# Patient Record
Sex: Male | Born: 1960 | Race: White | Hispanic: No | Smoking: Former smoker
Health system: Southern US, Community
[De-identification: ages and names within clinical notes are randomized; demographics above are authoritative.]

## PROBLEM LIST (undated history)

## (undated) DIAGNOSIS — Z9289 Personal history of other medical treatment: Secondary | ICD-10-CM

## (undated) DIAGNOSIS — N400 Enlarged prostate without lower urinary tract symptoms: Secondary | ICD-10-CM

## (undated) DIAGNOSIS — G40909 Epilepsy, unspecified, not intractable, without status epilepticus: Secondary | ICD-10-CM

## (undated) DIAGNOSIS — H01009 Unspecified blepharitis unspecified eye, unspecified eyelid: Secondary | ICD-10-CM

## (undated) DIAGNOSIS — Z8249 Family history of ischemic heart disease and other diseases of the circulatory system: Secondary | ICD-10-CM

## (undated) DIAGNOSIS — I2510 Atherosclerotic heart disease of native coronary artery without angina pectoris: Secondary | ICD-10-CM

## (undated) DIAGNOSIS — K219 Gastro-esophageal reflux disease without esophagitis: Secondary | ICD-10-CM

## (undated) DIAGNOSIS — Z85828 Personal history of other malignant neoplasm of skin: Secondary | ICD-10-CM

## (undated) DIAGNOSIS — E039 Hypothyroidism, unspecified: Secondary | ICD-10-CM

## (undated) DIAGNOSIS — E785 Hyperlipidemia, unspecified: Secondary | ICD-10-CM

## (undated) DIAGNOSIS — I10 Essential (primary) hypertension: Secondary | ICD-10-CM

## (undated) DIAGNOSIS — G43909 Migraine, unspecified, not intractable, without status migrainosus: Secondary | ICD-10-CM

## (undated) HISTORY — DX: Hypothyroidism, unspecified: E03.9

## (undated) HISTORY — DX: Unspecified blepharitis unspecified eye, unspecified eyelid: H01.009

## (undated) HISTORY — DX: Benign prostatic hyperplasia without lower urinary tract symptoms: N40.0

## (undated) HISTORY — DX: Personal history of other medical treatment: Z92.89

## (undated) HISTORY — DX: Atherosclerotic heart disease of native coronary artery without angina pectoris: I25.10

## (undated) HISTORY — DX: Migraine, unspecified, not intractable, without status migrainosus: G43.909

## (undated) HISTORY — DX: Family history of ischemic heart disease and other diseases of the circulatory system: Z82.49

## (undated) HISTORY — DX: Essential (primary) hypertension: I10

## (undated) HISTORY — DX: Gastro-esophageal reflux disease without esophagitis: K21.9

## (undated) HISTORY — DX: Epilepsy, unspecified, not intractable, without status epilepticus: G40.909

## (undated) HISTORY — DX: Personal history of other malignant neoplasm of skin: Z85.828

## (undated) HISTORY — PX: APPENDECTOMY: SHX54

## (undated) HISTORY — DX: Hyperlipidemia, unspecified: E78.5

---

## 1991-06-20 HISTORY — PX: OTHER SURGICAL HISTORY: SHX169

## 1996-09-17 ENCOUNTER — Encounter: Payer: Self-pay | Admitting: Family Medicine

## 1996-09-17 LAB — CONVERTED CEMR LAB: PSA: 1.5 ng/mL

## 1998-09-18 HISTORY — PX: OTHER SURGICAL HISTORY: SHX169

## 2000-05-19 ENCOUNTER — Encounter: Payer: Self-pay | Admitting: Family Medicine

## 2000-05-19 LAB — CONVERTED CEMR LAB: PSA: 1.5 ng/mL

## 2001-06-19 ENCOUNTER — Encounter: Payer: Self-pay | Admitting: Family Medicine

## 2001-06-19 LAB — CONVERTED CEMR LAB: PSA: 1.6 ng/mL

## 2003-01-26 ENCOUNTER — Ambulatory Visit (HOSPITAL_COMMUNITY): Admission: RE | Admit: 2003-01-26 | Discharge: 2003-01-26 | Payer: Self-pay | Admitting: Gastroenterology

## 2003-01-26 LAB — HM COLONOSCOPY: HM Colonoscopy: NORMAL

## 2004-05-02 ENCOUNTER — Ambulatory Visit: Payer: Self-pay | Admitting: Family Medicine

## 2004-06-21 ENCOUNTER — Ambulatory Visit: Payer: Self-pay | Admitting: Family Medicine

## 2004-10-13 ENCOUNTER — Ambulatory Visit: Payer: Self-pay | Admitting: Family Medicine

## 2005-03-19 ENCOUNTER — Encounter: Payer: Self-pay | Admitting: Family Medicine

## 2005-03-19 LAB — CONVERTED CEMR LAB: PSA: 1.56 ng/mL

## 2005-04-10 ENCOUNTER — Ambulatory Visit: Payer: Self-pay | Admitting: Family Medicine

## 2005-04-12 ENCOUNTER — Ambulatory Visit: Payer: Self-pay | Admitting: Family Medicine

## 2005-10-11 ENCOUNTER — Ambulatory Visit: Payer: Self-pay | Admitting: Family Medicine

## 2006-01-03 ENCOUNTER — Ambulatory Visit: Payer: Self-pay | Admitting: Family Medicine

## 2006-04-16 ENCOUNTER — Ambulatory Visit: Payer: Self-pay | Admitting: Family Medicine

## 2006-04-16 LAB — CONVERTED CEMR LAB: PSA: 1.63 ng/mL

## 2006-04-18 ENCOUNTER — Ambulatory Visit: Payer: Self-pay | Admitting: Family Medicine

## 2006-05-29 ENCOUNTER — Ambulatory Visit: Payer: Self-pay | Admitting: Family Medicine

## 2006-07-27 ENCOUNTER — Ambulatory Visit: Payer: Self-pay | Admitting: Family Medicine

## 2006-07-27 LAB — CONVERTED CEMR LAB
ALT: 34 units/L (ref 0–40)
AST: 34 units/L (ref 0–37)
Cholesterol: 179 mg/dL (ref 0–200)
HDL: 30.4 mg/dL — ABNORMAL LOW (ref >39.0)
LDL Cholesterol: 113 mg/dL — ABNORMAL HIGH (ref 0–99)
Total CHOL/HDL Ratio: 5.9
Triglycerides: 179 mg/dL — ABNORMAL HIGH (ref 0–149)
VLDL: 36 mg/dL (ref 0–40)
Valproic Acid Lvl: 87 ug/mL (ref 50.0–100.0)

## 2006-07-31 ENCOUNTER — Ambulatory Visit: Payer: Self-pay | Admitting: Family Medicine

## 2006-10-22 ENCOUNTER — Telehealth (INDEPENDENT_AMBULATORY_CARE_PROVIDER_SITE_OTHER): Payer: Self-pay | Admitting: *Deleted

## 2006-11-09 ENCOUNTER — Telehealth (INDEPENDENT_AMBULATORY_CARE_PROVIDER_SITE_OTHER): Payer: Self-pay | Admitting: *Deleted

## 2006-12-24 ENCOUNTER — Ambulatory Visit: Payer: Self-pay | Admitting: Family Medicine

## 2006-12-24 LAB — CONVERTED CEMR LAB
ALT: 27 units/L (ref 0–53)
AST: 29 units/L (ref 0–37)
Albumin: 3.7 g/dL (ref 3.5–5.2)
Alkaline Phosphatase: 44 units/L (ref 39–117)
Bilirubin, Direct: 0.1 mg/dL (ref 0.0–0.3)
Cholesterol: 180 mg/dL (ref 0–200)
HDL: 26.8 mg/dL — ABNORMAL LOW (ref >39.0)
LDL Cholesterol: 117 mg/dL — ABNORMAL HIGH (ref 0–99)
Total Bilirubin: 1.1 mg/dL (ref 0.3–1.2)
Total CHOL/HDL Ratio: 6.7
Total Protein: 6.7 g/dL (ref 6.0–8.3)
Triglycerides: 180 mg/dL — ABNORMAL HIGH (ref 0–149)
VLDL: 36 mg/dL (ref 0–40)

## 2007-04-10 ENCOUNTER — Encounter: Payer: Self-pay | Admitting: Family Medicine

## 2007-04-16 ENCOUNTER — Encounter: Payer: Self-pay | Admitting: Family Medicine

## 2007-04-16 DIAGNOSIS — K219 Gastro-esophageal reflux disease without esophagitis: Secondary | ICD-10-CM

## 2007-04-16 DIAGNOSIS — E039 Hypothyroidism, unspecified: Secondary | ICD-10-CM

## 2007-04-16 DIAGNOSIS — N400 Enlarged prostate without lower urinary tract symptoms: Secondary | ICD-10-CM

## 2007-04-16 DIAGNOSIS — R399 Unspecified symptoms and signs involving the genitourinary system: Secondary | ICD-10-CM | POA: Insufficient documentation

## 2007-04-23 ENCOUNTER — Ambulatory Visit: Payer: Self-pay | Admitting: Family Medicine

## 2007-04-23 DIAGNOSIS — G40909 Epilepsy, unspecified, not intractable, without status epilepticus: Secondary | ICD-10-CM

## 2007-04-23 LAB — CONVERTED CEMR LAB
ALT: 28 units/L (ref 0–53)
AST: 33 units/L (ref 0–37)
BUN: 17 mg/dL (ref 6–23)
CO2: 27 meq/L (ref 19–32)
Calcium: 9.5 mg/dL (ref 8.4–10.5)
Chloride: 108 meq/L (ref 96–112)
Cholesterol: 178 mg/dL (ref 0–200)
Creatinine, Ser: 0.9 mg/dL (ref 0.4–1.5)
Free T4: 0.8 ng/dL (ref 0.6–1.6)
GFR calc Af Amer: 117 mL/min
GFR calc non Af Amer: 97 mL/min
Glucose, Bld: 91 mg/dL (ref 70–99)
HDL: 27.7 mg/dL — ABNORMAL LOW (ref >39.0)
LDL Cholesterol: 121 mg/dL — ABNORMAL HIGH (ref 0–99)
PSA: 1.58 ng/mL (ref 0.10–4.00)
Potassium: 4.7 meq/L (ref 3.5–5.1)
Sodium: 140 meq/L (ref 135–145)
TSH: 8.95 microintl units/mL — ABNORMAL HIGH (ref 0.35–5.50)
Total CHOL/HDL Ratio: 6.4
Triglycerides: 149 mg/dL (ref 0–149)
VLDL: 30 mg/dL (ref 0–40)

## 2007-04-24 LAB — CONVERTED CEMR LAB: Valproic Acid Lvl: 102.9 ug/mL — ABNORMAL HIGH (ref 50.0–100.0)

## 2007-04-30 ENCOUNTER — Ambulatory Visit: Payer: Self-pay | Admitting: Family Medicine

## 2007-05-07 ENCOUNTER — Ambulatory Visit: Payer: Self-pay | Admitting: Family Medicine

## 2007-05-07 ENCOUNTER — Encounter (INDEPENDENT_AMBULATORY_CARE_PROVIDER_SITE_OTHER): Payer: Self-pay | Admitting: *Deleted

## 2007-05-07 LAB — FECAL OCCULT BLOOD, GUAIAC: Fecal Occult Blood: NEGATIVE

## 2007-10-15 ENCOUNTER — Ambulatory Visit: Payer: Self-pay | Admitting: Family Medicine

## 2007-10-21 ENCOUNTER — Telehealth (INDEPENDENT_AMBULATORY_CARE_PROVIDER_SITE_OTHER): Payer: Self-pay | Admitting: Internal Medicine

## 2007-11-19 ENCOUNTER — Telehealth: Payer: Self-pay | Admitting: Family Medicine

## 2007-12-03 ENCOUNTER — Ambulatory Visit: Payer: Self-pay | Admitting: Family Medicine

## 2007-12-03 DIAGNOSIS — T887XXA Unspecified adverse effect of drug or medicament, initial encounter: Secondary | ICD-10-CM

## 2007-12-05 LAB — CONVERTED CEMR LAB: Valproic Acid Lvl: 114.1 ug/mL — ABNORMAL HIGH (ref 50.0–100.0)

## 2007-12-18 ENCOUNTER — Ambulatory Visit: Payer: Self-pay | Admitting: Family Medicine

## 2007-12-19 LAB — CONVERTED CEMR LAB: Valproic Acid Lvl: 83 ug/mL (ref 50.0–100.0)

## 2008-02-27 ENCOUNTER — Telehealth: Payer: Self-pay | Admitting: Family Medicine

## 2008-03-27 ENCOUNTER — Ambulatory Visit: Payer: Self-pay | Admitting: Family Medicine

## 2008-03-28 LAB — CONVERTED CEMR LAB
ALT: 30 units/L (ref 0–53)
AST: 31 units/L (ref 0–37)
Albumin: 3.9 g/dL (ref 3.5–5.2)
Alkaline Phosphatase: 55 units/L (ref 39–117)
BUN: 14 mg/dL (ref 6–23)
Bilirubin, Direct: 0.1 mg/dL (ref 0.0–0.3)
CO2: 28 meq/L (ref 19–32)
Calcium: 9.6 mg/dL (ref 8.4–10.5)
Chloride: 108 meq/L (ref 96–112)
Cholesterol: 179 mg/dL (ref 0–200)
Creatinine, Ser: 1 mg/dL (ref 0.4–1.5)
Free T4: 0.8 ng/dL (ref 0.6–1.6)
GFR calc Af Amer: 103 mL/min
GFR calc non Af Amer: 85 mL/min
Glucose, Bld: 90 mg/dL (ref 70–99)
HDL: 27.1 mg/dL — ABNORMAL LOW (ref >39.0)
LDL Cholesterol: 129 mg/dL — ABNORMAL HIGH (ref 0–99)
PSA: 1.71 ng/mL (ref 0.10–4.00)
Potassium: 5.3 meq/L — ABNORMAL HIGH (ref 3.5–5.1)
Sodium: 142 meq/L (ref 135–145)
TSH: 13.52 microintl units/mL — ABNORMAL HIGH (ref 0.35–5.50)
Total Bilirubin: 1.3 mg/dL — ABNORMAL HIGH (ref 0.3–1.2)
Total Protein: 6.8 g/dL (ref 6.0–8.3)
Triglycerides: 113 mg/dL (ref 0–149)
VLDL: 23 mg/dL (ref 0–40)
Valproic Acid Lvl: 99.7 ug/mL (ref 50.0–100.0)

## 2008-03-31 ENCOUNTER — Ambulatory Visit: Payer: Self-pay | Admitting: Family Medicine

## 2008-03-31 DIAGNOSIS — Z8719 Personal history of other diseases of the digestive system: Secondary | ICD-10-CM

## 2008-04-01 LAB — CONVERTED CEMR LAB: Potassium: 4.2 meq/L (ref 3.5–5.1)

## 2008-04-16 ENCOUNTER — Encounter: Payer: Self-pay | Admitting: Family Medicine

## 2008-04-21 ENCOUNTER — Ambulatory Visit: Payer: Self-pay | Admitting: Family Medicine

## 2008-06-24 ENCOUNTER — Ambulatory Visit: Payer: Self-pay | Admitting: Family Medicine

## 2008-06-24 LAB — CONVERTED CEMR LAB: TSH: 3.05 microintl units/mL (ref 0.35–5.50)

## 2008-07-01 ENCOUNTER — Ambulatory Visit: Payer: Self-pay | Admitting: Family Medicine

## 2008-07-01 DIAGNOSIS — I10 Essential (primary) hypertension: Secondary | ICD-10-CM

## 2009-03-23 ENCOUNTER — Telehealth: Payer: Self-pay | Admitting: Family Medicine

## 2009-03-30 ENCOUNTER — Ambulatory Visit: Payer: Self-pay | Admitting: Family Medicine

## 2009-03-30 LAB — CONVERTED CEMR LAB
ALT: 27 units/L (ref 0–53)
AST: 26 units/L (ref 0–37)
Albumin: 4.3 g/dL (ref 3.5–5.2)
Alkaline Phosphatase: 52 units/L (ref 39–117)
BUN: 16 mg/dL (ref 6–23)
Basophils Absolute: 0 10*3/uL (ref 0.0–0.1)
Basophils Relative: 0.2 % (ref 0.0–3.0)
Bilirubin, Direct: 0 mg/dL (ref 0.0–0.3)
CO2: 26 meq/L (ref 19–32)
Calcium: 9.8 mg/dL (ref 8.4–10.5)
Chloride: 106 meq/L (ref 96–112)
Cholesterol: 211 mg/dL — ABNORMAL HIGH (ref 0–200)
Creatinine, Ser: 1 mg/dL (ref 0.4–1.5)
Direct LDL: 156.5 mg/dL
Eosinophils Absolute: 0.2 10*3/uL (ref 0.0–0.7)
Eosinophils Relative: 3.2 % (ref 0.0–5.0)
Free T4: 0.8 ng/dL (ref 0.6–1.6)
GFR calc non Af Amer: 84.49 mL/min (ref >60)
Glucose, Bld: 90 mg/dL (ref 70–99)
HCT: 48.6 % (ref 39.0–52.0)
HDL: 33.3 mg/dL — ABNORMAL LOW (ref >39.00)
Hemoglobin: 16.9 g/dL (ref 13.0–17.0)
Lymphocytes Relative: 43.2 % (ref 12.0–46.0)
Lymphs Abs: 2 10*3/uL (ref 0.7–4.0)
MCHC: 34.8 g/dL (ref 30.0–36.0)
MCV: 96.6 fL (ref 78.0–100.0)
Monocytes Absolute: 0.6 10*3/uL (ref 0.1–1.0)
Monocytes Relative: 12.5 % — ABNORMAL HIGH (ref 3.0–12.0)
Neutro Abs: 1.9 10*3/uL (ref 1.4–7.7)
Neutrophils Relative %: 40.9 % — ABNORMAL LOW (ref 43.0–77.0)
PSA: 2.1 ng/mL (ref 0.10–4.00)
Platelets: 150 10*3/uL (ref 150.0–400.0)
Potassium: 4.7 meq/L (ref 3.5–5.1)
RBC: 5.03 M/uL (ref 4.22–5.81)
RDW: 12.2 % (ref 11.5–14.6)
Sodium: 142 meq/L (ref 135–145)
TSH: 4.04 microintl units/mL (ref 0.35–5.50)
Total Bilirubin: 1.3 mg/dL — ABNORMAL HIGH (ref 0.3–1.2)
Total CHOL/HDL Ratio: 6
Total Protein: 7.2 g/dL (ref 6.0–8.3)
Triglycerides: 177 mg/dL — ABNORMAL HIGH (ref 0.0–149.0)
VLDL: 35.4 mg/dL (ref 0.0–40.0)
WBC: 4.7 10*3/uL (ref 4.5–10.5)

## 2009-03-31 LAB — CONVERTED CEMR LAB: Valproic Acid Lvl: 110.5 ug/mL — ABNORMAL HIGH (ref 50.0–100.0)

## 2009-04-06 ENCOUNTER — Telehealth: Payer: Self-pay | Admitting: Family Medicine

## 2009-04-06 ENCOUNTER — Ambulatory Visit: Payer: Self-pay | Admitting: Family Medicine

## 2009-09-28 ENCOUNTER — Ambulatory Visit: Payer: Self-pay | Admitting: Family Medicine

## 2009-09-28 LAB — CONVERTED CEMR LAB
BUN: 14 mg/dL (ref 6–23)
Bilirubin, Direct: 0.2 mg/dL (ref 0.0–0.3)
Calcium: 9.4 mg/dL (ref 8.4–10.5)
Cholesterol: 166 mg/dL (ref 0–200)
Creatinine, Ser: 0.8 mg/dL (ref 0.4–1.5)
GFR calc non Af Amer: 109.08 mL/min (ref >60)
HDL: 33.7 mg/dL — ABNORMAL LOW (ref >39.00)
Total Bilirubin: 1.1 mg/dL (ref 0.3–1.2)
Total CHOL/HDL Ratio: 5
Triglycerides: 224 mg/dL — ABNORMAL HIGH (ref 0.0–149.0)
VLDL: 44.8 mg/dL — ABNORMAL HIGH (ref 0.0–40.0)

## 2009-10-05 ENCOUNTER — Ambulatory Visit: Payer: Self-pay | Admitting: Family Medicine

## 2009-10-05 DIAGNOSIS — L708 Other acne: Secondary | ICD-10-CM

## 2009-11-08 ENCOUNTER — Ambulatory Visit: Payer: Self-pay | Admitting: Family Medicine

## 2010-01-25 ENCOUNTER — Encounter (INDEPENDENT_AMBULATORY_CARE_PROVIDER_SITE_OTHER): Payer: Self-pay | Admitting: *Deleted

## 2010-04-04 ENCOUNTER — Telehealth (INDEPENDENT_AMBULATORY_CARE_PROVIDER_SITE_OTHER): Payer: Self-pay | Admitting: *Deleted

## 2010-04-05 ENCOUNTER — Ambulatory Visit: Payer: Self-pay | Admitting: Family Medicine

## 2010-04-05 LAB — CONVERTED CEMR LAB
ALT: 26 units/L (ref 0–53)
AST: 25 units/L (ref 0–37)
BUN: 17 mg/dL (ref 6–23)
CO2: 28 meq/L (ref 19–32)
Chloride: 109 meq/L (ref 96–112)
Cholesterol: 187 mg/dL (ref 0–200)
Glucose, Bld: 89 mg/dL (ref 70–99)
LDL Cholesterol: 116 mg/dL — ABNORMAL HIGH (ref 0–99)
PSA: 1.83 ng/mL (ref 0.10–4.00)
Potassium: 5 meq/L (ref 3.5–5.1)
Total Bilirubin: 1.1 mg/dL (ref 0.3–1.2)
Total Protein: 6.5 g/dL (ref 6.0–8.3)

## 2010-04-11 ENCOUNTER — Telehealth: Payer: Self-pay | Admitting: Family Medicine

## 2010-04-12 ENCOUNTER — Ambulatory Visit: Payer: Self-pay | Admitting: Family Medicine

## 2010-07-02 ENCOUNTER — Encounter: Payer: Self-pay | Admitting: Family Medicine

## 2010-07-19 ENCOUNTER — Ambulatory Visit
Admission: RE | Admit: 2010-07-19 | Discharge: 2010-07-19 | Payer: Self-pay | Source: Home / Self Care | Attending: Family Medicine | Admitting: Family Medicine

## 2010-07-19 DIAGNOSIS — R21 Rash and other nonspecific skin eruption: Secondary | ICD-10-CM | POA: Insufficient documentation

## 2010-07-19 NOTE — Progress Notes (Signed)
Summary: divalproex   Phone Note Refill Request Message from:  Fax from Pharmacy on April 11, 2010 3:03 PM  Refills Requested: Medication #1:  DIVALPROEX SODIUM 500 MG  TBEC 2 TABLETS BY MOUTH IN AM AND  AT NIGHT   Last Refilled: 03/09/2010 Refill request from [facility]. [phone]  Initial call taken by: Melody Comas,  April 11, 2010 3:04 PM    Prescriptions: DIVALPROEX SODIUM 500 MG  TBEC (DIVALPROEX SODIUM) 2 TABLETS BY MOUTH IN AM AND  AT NIGHT  #120 x 12   Entered and Authorized by:   Shaune Leeks MD   Signed by:   Shaune Leeks MD on 04/12/2010   Method used:   Electronically to        [facility] [phone]* (retail)       [address] [place], [place]  [address]       Ph: [phone]       Fax: [phone]   RxID:   [id number]

## 2010-07-19 NOTE — Assessment & Plan Note (Signed)
Summary: 6 WK F/U BP CHECK/DLO   Vital Signs:  Patient profile:   50 year old male Weight:      226 pounds Temp:     98.4 degrees F oral Pulse rate:   60 / minute Pulse rhythm:   regular BP sitting:   112 / 82  (left arm) Cuff size:   large  Vitals Entered By: Sydell Axon LPN (Nov 08, 2009 11:23 AM) CC: 6 Week follow-up on BP   History of Present Illness: Pt here for BP check. He was started on Metoprolol last visit and he slept better than before for a few nights. He has had some headaches also but it has been very humid lately. He seems overall to be tolerating the medication well.  He still has some urinary leakage/dribbling which I have told him will not tend toget better. He implies he might be interested in having something done in the future.  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 Take 1 Tablet By Mouth Twice A Day 2)  Levothyroxine Sodium 112 Mcg Tabs (Levothyroxine Sodium) .... One Tab By Mouth Daily 3)  Allegra 180 Mg  Tabs (Fexofenadine Hcl) .... Take One By Mouth Once A Day As Needed 4)  Omega-3 350 Mg  Caps (Omega-3 Fatty Acids) .... Take 2 By Mouth Dailytakes Prn 5)  Divalproex Sodium 500 Mg  Tbec (Divalproex Sodium) .... 2 Tablets By Mouth in Am and  At Night 6)  Pravachol  40 Mg Tabs (Pravastatin Sodium) .Marland Kitchen.. 1 Daily By Mouth 7)  Del-Aqua 10 % Crea (Benzoyl Peroxide) .... Apply To Face At Night 8)  Metoprolol Succinate 25 Mg Xr24h-Tab (Metoprolol Succinate) .... One Tab By Mouth At Night  Allergies: No Known Drug Allergies  Physical Exam  General:  Well-developed,well-nourished,in no acute distress; alert,appropriate and cooperative throughout examination. Head:  Normocephalic and atraumatic without obvious abnormalities. No apparent alopecia or balding. Sinuses nontender. Eyes:  Conjunctiva clear bilaterally.  Ears:  External ear exam shows no significant lesions or deformities.  Otoscopic examination reveals clear canals, tympanic membranes are intact bilaterally without bulging, retraction, inflammation or discharge. Hearing is grossly normal bilaterally. Minimal eczema inside left pinna. Nose:  External nasal examination shows no deformity or inflammation. Nasal mucosa are pink and moist without lesions or exudates.  Mouth:  Oral mucosa and oropharynx without lesions or exudates.  Teeth in good repair. Neck:  No deformities, masses, or tenderness noted. Chest Wall:  No deformities, masses, tenderness or gynecomastia noted. Lungs:  Normal respiratory effort, chest expands symmetrically. Lungs are clear to auscultation, no crackles or wheezes. Heart:  Normal rate and regular rhythm. S1 and S2 normal without gallop, murmur, click, rub or other extra sounds.   Impression & Recommendations:  Problem # 1:  ESSENTIAL HYPERTENSION (ICD-401.9) Assessment Improved Much better. Dad's risk of CAD mitigated.  Cont curr med since well tolerated. His updated medication list for this problem includes:    Metoprolol Succinate 25 Mg Xr24h-tab (Metoprolol succinate) ..... One tab by mouth at night  BP today: 112/82 Prior BP: 138/80 (10/05/2009)  Labs Reviewed: K+: 4.8 (09/28/2009) Creat: : 0.8 (09/28/2009)   Chol: 166 (09/28/2009)   HDL: 33.70 (09/28/2009)   LDL:  129 (03/27/2008)   TG: 224.0 (09/28/2009)  Problem # 2:  BENIGN PROSTATIC HYPERTROPHY WITH BACK PAIN (ICD-600.00) Assessment: Unchanged Dribbling may get to be unacceptable. Will follow.  Complete Medication List: 1)  Topamax 25 Mg Tabs (Topiramate) .... Take 1 tablet by mouth twice a day 2)  Levothyroxine Sodium 112 Mcg Tabs (Levothyroxine sodium) .... One tab by mouth daily 3)  Allegra 180 Mg Tabs (Fexofenadine hcl) .... Take one by mouth once a day as needed 4)  Omega-3 350 Mg Caps (Omega-3 fatty acids) .... Take 2 by mouth dailytakes prn 5)  Divalproex Sodium 500 Mg Tbec (Divalproex sodium) .... 2 tablets by mouth in am and  at night 6)  Pravachol 40 Mg Tabs (Pravastatin sodium) .Marland Kitchen.. 1 daily by mouth 7)  Del-aqua 10 % Crea (Benzoyl peroxide) .... Apply to face at night 8)  Metoprolol Succinate 25 Mg Xr24h-tab (Metoprolol succinate) .... One tab by mouth at night  Patient Instructions: 1)  Refer to Dr Para March to establish care, Physical exam in Oct.   Current Allergies (reviewed today): No known allergies

## 2010-07-19 NOTE — Assessment & Plan Note (Signed)
Summary: F/U AFTER LABS / LFW   Vital Signs:  Patient profile:   50 year old male Weight:      224.25 pounds Temp:     98.1 degrees F oral Pulse rate:   64 / minute Pulse rhythm:   regular BP sitting:   138 / 80  (left arm) Cuff size:   large  Vitals Entered By: Sydell Axon LPN (October 05, 2009 8:43 AM) CC: 6 Month follow-up after labs   History of Present Illness: Pt here for followup to check on BP which has been elevated mildly and to recheck seizure disorder and other labs. He feel well and has no complaints.  He has been using a friends generic Elocon cream on his face regulasrly for acne and asked for amprescription. We discussed not using that regularlty on the face to avoid thinning the skin and seeing vessels. He has some dry skin in the left ear inner pinna.  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 Take 1 Tablet By Mouth Twice A Day 2)  Levothyroxine Sodium 112 Mcg Tabs (Levothyroxine Sodium) .... One Tab By Mouth Daily 3)  Multivitamins   Tabs (Multiple Vitamin) .... Take One By Mouth Once A Day 4)  Allegra 180 Mg  Tabs (Fexofenadine Hcl) .... Take One By Mouth Once A Day As Needed 5)  Omega-3 350 Mg  Caps (Omega-3 Fatty Acids) .... Take 2 By Mouth Dailytakes Prn 6)   Divalproex Sodium 500 Mg  Tbec (Divalproex Sodium) .... 2 Tablets By Mouth in Am and  At Night 7)  Pravachol 40 Mg Tabs (Pravastatin Sodium) .Marland Kitchen.. 1 Daily By Mouth  Allergies: No Known Drug Allergies  Physical Exam  General:  Well-developed,well-nourished,in no acute distress; alert,appropriate and cooperative throughout examination. Head:  Normocephalic and atraumatic without obvious abnormalities. No apparent alopecia or balding. Sinuses nontender. Eyes:  Conjunctiva clear bilaterally.  Ears:  External ear exam shows no significant lesions or deformities.  Otoscopic examination reveals clear canals, tympanic membranes are intact bilaterally without bulging, retraction, inflammation or discharge. Hearing is grossly normal bilaterally. Minimal eczema inside left pinna. Nose:  External nasal examination shows no deformity or inflammation. Nasal mucosa are pink and moist without lesions or exudates.  Mouth:  Oral mucosa and oropharynx without lesions or exudates.  Teeth in good repair. Neck:  No deformities, masses, or tenderness noted. Chest Wall:  No deformities, masses, tenderness or gynecomastia noted. Lungs:  Normal respiratory effort, chest expands symmetrically. Lungs are clear to auscultation, no crackles or wheezes. Heart:  Normal rate and regular rhythm. S1 and S2 normal without gallop, murmur, click, rub or other extra sounds. Abdomen:  Bowel sounds positive,abdomen soft and non-tender without masses, organomegaly or hernias noted.   Impression & Recommendations:  Problem # 1:  HYPERTENSION, BORDERLINE (ICD-401.9) Assessment New  Due to father's early  CAD, will start MEtoprolol to protect. BPs were 150s/70-80s prior. His updated medication list for this problem includes:    Metoprolol Succinate 25 Mg Xr24h-tab (Metoprolol succinate) ..... One tab by mouth at night  BP today: 138/80 Prior BP: 122/82 (04/06/2009)  Labs Reviewed: K+: 4.8 (09/28/2009) Creat: : 0.8 (09/28/2009)    Chol: 166 (09/28/2009)   HDL: 33.70 (09/28/2009)   LDL: 129 (03/27/2008)   TG: 224.0 (09/28/2009)  Problem # 2:  ACNE, MILD (ICD-706.1) Assessment: New  Trial of Benzoyl, warned of drying. His updated medication list for this problem includes:    Del-aqua 10 % Crea (Benzoyl peroxide) .Marland Kitchen... Apply to face at night  Discussed care of the skin and different treatment options.   Problem # 3:  UNSPEC EPILEPSY WITHOUT MENTION INTRACT EPILEPSY (ICD-345.90) Assessment: Unchanged  Stable. 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 His updated medication list for this problem includes:    Topamax 25 Mg Tabs (Topiramate) .Marland Kitchen... Take 1 tablet by mouth twice a day    Divalproex Sodium 500 Mg Tbec (Divalproex sodium) .Marland Kitchen... 2 tablets by mouth in am and  at night  Labs Reviewed: Hgb: 16.9 (03/30/2009)   Hct: 48.6 (03/30/2009)   WBC: 4.7 (03/30/2009)   RBC: 5.03 (03/30/2009)   Plts: 150.0 (03/30/2009) SGOT: 24 (09/28/2009)   SGPT: 29 (09/28/2009)    Valproic Acid: 110.5 (03/30/2009)  Problem # 4:  HYPOTHYROIDISM (ICD-244.9) Assessment: Unchanged  Euthyroid on present dose. His updated medication list for this problem includes:    Levothyroxine Sodium 112 Mcg Tabs (Levothyroxine sodium) ..... One tab by mouth daily  Labs Reviewed: TSH: 3.35 (09/28/2009)    Chol: 166 (09/28/2009)   HDL: 33.70 (09/28/2009)   LDL: 129 (03/27/2008)   TG: 224.0 (09/28/2009)  Problem # 5:  HYPERCHOLESTEROLEMIA, PURE (212/ LDL 134) (ICD-272.0) Again discusseed getting Trigs down and HDL up...declined Niacin. His updated medication list for this problem includes:    Pravachol 40 Mg Tabs (Pravastatin sodium) .Marland Kitchen... 1 daily by mouth  Labs Reviewed: SGOT: 24 (09/28/2009)   SGPT: 29 (09/28/2009)   HDL:33.70 (09/28/2009), 33.30 (03/30/2009)  LDL:129 (03/27/2008), 121 (04/23/2007)  Chol:166 (09/28/2009), 211 (03/30/2009)  Trig:224.0 (09/28/2009), 177.0 (03/30/2009)  Complete Medication List: 1)  Topamax 25 Mg Tabs (Topiramate)  .... Take 1 tablet by mouth twice a day 2)  Levothyroxine Sodium 112 Mcg Tabs (Levothyroxine sodium) .... One tab by mouth daily 3)  Allegra 180 Mg Tabs (Fexofenadine hcl) .... Take one by mouth once a day as needed 4)  Omega-3 350 Mg Caps (Omega-3 fatty acids) .... Take 2 by mouth dailytakes prn 5)  Divalproex Sodium 500 Mg Tbec (Divalproex sodium) .... 2 tablets by mouth in am and  at night 6)  Pravachol 40 Mg Tabs (Pravastatin sodium) .Marland Kitchen.. 1 daily by mouth 7)  Del-aqua 10 % Crea (Benzoyl peroxide) .... Apply to face at night 8)  Metoprolol Succinate 25 Mg Xr24h-tab (Metoprolol succinate) .... One tab by mouth at night  Patient Instructions: 1)  RTC 6 weeks for BP check. Prescriptions: METOPROLOL SUCCINATE 25 MG XR24H-TAB (METOPROLOL SUCCINATE) one tab by mouth at night  #30 x 12   Entered and Authorized by:   Shaune Leeks MD   Signed by:   Shaune Leeks MD on 10/05/2009   Method used:   Electronically to        [facility] [phone]* (retail)       [address]       [place], [place]  [address]       Ph: [phone]       Fax: [phone]   RxID:   [id number] DEL-AQUA 10 % CREA (BENZOYL PEROXIDE) apply to face at night  #1 tube x 12   Entered and Authorized by:   Shaune Leeks MD   Signed by:   Shaune Leeks MD on 10/05/2009   Method used:   Electronically to        [facility] [phone]* (retail)       [address] [place], [place]  [address]       Ph: [phone]       Fax: [phone]   RxID:   [id number] TOPAMAX 25 MG TABS (TOPIRAMATE) Take 1 tablet by mouth twice a day  #60 x 12   Entered and Authorized by:   Shaune Leeks MD   Signed by:   Shaune Leeks MD on 10/05/2009   Method used:   Electronically to        [facility] [phone]* (retail)       [address] [place], [place]  [address]       Ph: [phone]       Fax: [phone]   RxID:   [id number] ALLEGRA 180 MG  TABS  (FEXOFENADINE HCL) Take one by mouth once a day as needed  #30 x 12   Entered by:   Sydell Axon LPN   Authorized by:   Shaune Leeks MD   Signed by:   Sydell Axon LPN on 71/24/5809   Method used:   Electronically to        [facility] [phone]* (retail)       [address] [place], [place]  [address]       Ph: [phone]       Fax: [phone]   RxID:   [id number]   Current Allergies (reviewed today): No known allergies
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
## 2010-07-19 NOTE — Progress Notes (Signed)
----   Converted from flag ---- ---- 03/30/2010 8:43 PM, Crawford Givens MD wrote: psa 600.00 tsh 244.9 cmet/lipid 272.0  ---- 03/30/2010 12:03 PM, Liane Comber CMA (AAMA) wrote: Lab orders please! Good Morning! This pt is scheduled for cpx labs Tuesday, which labs to draw and dx codes to use? Thanks Tasha ------------------------------

## 2010-07-19 NOTE — Letter (Signed)
Summary: [name] letter  [name]  [address] [name], [place] [address]   Phone: [phone]  Fax: [phone]       01/25/2010 MRN: [id number]  Kevin Charles [address] [place], [place]  [address]  Dear [name],  [facility], and [facility] announce the retirement of Arta Silence, M.D., from full-time practice at the [facility] office effective December 16, 2009 and his plans of returning part-time.  It is important to Dr. Hetty Ely and to our practice that you understand that [facility] has seven physicians in our office for your health care needs.  We will continue to offer the same exceptional care that you have today.    Dr. Hetty Ely has spoken to many of you about his plans for retirement and returning part-time in the fall.   We will continue to work with you through the transition to schedule appointments for you in the office and meet the high standards that [facility] is committed to.   Again, it is with great pleasure that we share the news that Dr. Hetty Ely will return to [facility] at [facility] in October of 2011 with a reduced schedule.    If you have any questions, or would like to request an appointment with one of our physicians, please call us at [phone] and press the option for Scheduling an appointment.  We take pleasure in providing you with excellent patient care and look forward to seeing you at your next office visit.  Our [facility] Physicians are:  Tillman Abide, M.D. Laurita Quint, M.D. Roxy Manns, M.D. Kerby Nora, M.D. Hannah Beat, M.D. Ruthe Mannan, M.D. We proudly welcomed Raechel Ache, M.D. and Eustaquio Boyden, M.D. to the practice in July/August 2011.  Sincerely,  [facility]
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
## 2010-08-04 NOTE — Letter (Signed)
Summary: DMV Medical Review   DMV Medical Review   Imported By: Kassie Mends 07/26/2010 10:48:45  _____________________________________________________________________  External Attachment:    Type:   Image     Comment:   External Document

## 2010-10-13 ENCOUNTER — Other Ambulatory Visit: Payer: Self-pay | Admitting: *Deleted

## 2010-10-13 MED ORDER — TOPIRAMATE 25 MG PO TABS: 25.0000 mg | ORAL_TABLET | Freq: Two times a day (BID) | ORAL | Status: DC

## 2010-11-11 ENCOUNTER — Other Ambulatory Visit: Payer: Self-pay

## 2010-11-11 MED ORDER — TOPIRAMATE 25 MG PO TABS: 25.0000 mg | ORAL_TABLET | Freq: Two times a day (BID) | ORAL | Status: DC

## 2010-11-11 NOTE — Telephone Encounter (Signed)
Sent!

## 2010-11-11 NOTE — Telephone Encounter (Signed)
Faxed request from [facility] for Topiramate 25mg .Please advise.

## 2011-01-24 ENCOUNTER — Telehealth: Payer: Self-pay | Admitting: *Deleted

## 2011-01-24 NOTE — Telephone Encounter (Signed)
Patient came by the office and left a note saying that he had labwork done with [facility], Dr. Daphine Deutscher.  They will fax results.  Does he still need CPX labs?

## 2011-01-24 NOTE — Telephone Encounter (Signed)
Patient advised as instructed via telephone. 

## 2011-01-24 NOTE — Telephone Encounter (Signed)
Let me look at the labs and then we can notify the patient.  Thanks.

## 2011-01-26 ENCOUNTER — Encounter: Payer: Self-pay | Admitting: Family Medicine

## 2011-01-29 ENCOUNTER — Telehealth: Payer: Self-pay | Admitting: Family Medicine

## 2011-01-29 DIAGNOSIS — E039 Hypothyroidism, unspecified: Secondary | ICD-10-CM

## 2011-01-29 DIAGNOSIS — N400 Enlarged prostate without lower urinary tract symptoms: Secondary | ICD-10-CM

## 2011-01-29 DIAGNOSIS — E7800 Pure hypercholesterolemia, unspecified: Secondary | ICD-10-CM

## 2011-01-29 NOTE — Telephone Encounter (Signed)
Still needs PSA, lipid, TSH. I'll await the CMET, CBC, and VPA level from neuro.  I put in the orders.  Please call pt and set up lab visit before CPE.  Thanks.

## 2011-01-30 NOTE — Telephone Encounter (Signed)
Lab appointment scheduled for 04/10/11

## 2011-03-09 ENCOUNTER — Encounter: Payer: Self-pay | Admitting: Family Medicine

## 2011-03-09 ENCOUNTER — Telehealth: Payer: Self-pay | Admitting: Family Medicine

## 2011-03-09 MED ORDER — PRAVASTATIN SODIUM 40 MG PO TABS: ORAL_TABLET | ORAL | Status: DC

## 2011-03-09 NOTE — Telephone Encounter (Signed)
Pharmacy is asking [place] to change the directions on the Pravastatin.   Their Sig: Take two tablets by mouth at bedtime.  That is what our meds list says also.  Can you understand what they're talking about?  Form in your in box.

## 2011-03-09 NOTE — Telephone Encounter (Signed)
Old dosing was 40mg  MWF with acceptable lipid levels. Will continue that for now.Script written and sent in accordingly.

## 2011-04-10 ENCOUNTER — Other Ambulatory Visit (INDEPENDENT_AMBULATORY_CARE_PROVIDER_SITE_OTHER): Payer: Self-pay

## 2011-04-10 DIAGNOSIS — Z125 Encounter for screening for malignant neoplasm of prostate: Secondary | ICD-10-CM

## 2011-04-10 DIAGNOSIS — N400 Enlarged prostate without lower urinary tract symptoms: Secondary | ICD-10-CM

## 2011-04-10 DIAGNOSIS — E7800 Pure hypercholesterolemia, unspecified: Secondary | ICD-10-CM

## 2011-04-10 DIAGNOSIS — E039 Hypothyroidism, unspecified: Secondary | ICD-10-CM

## 2011-04-10 LAB — PSA: PSA: 2.33 ng/mL (ref 0.10–4.00)

## 2011-04-10 LAB — TSH: TSH: 6.26 u[IU]/mL — ABNORMAL HIGH (ref 0.35–5.50)

## 2011-04-10 LAB — LIPID PANEL
Cholesterol: 180 mg/dL (ref 0–200)
VLDL: 29 mg/dL (ref 0.0–40.0)

## 2011-04-14 ENCOUNTER — Encounter: Payer: Self-pay | Admitting: Family Medicine

## 2011-04-17 ENCOUNTER — Encounter: Payer: Self-pay | Admitting: Family Medicine

## 2011-04-17 ENCOUNTER — Ambulatory Visit (INDEPENDENT_AMBULATORY_CARE_PROVIDER_SITE_OTHER): Payer: Medicare Other | Admitting: Family Medicine

## 2011-04-17 VITALS — BP 120/78 | HR 58 | Temp 97.8°F | Wt 217.1 lb

## 2011-04-17 DIAGNOSIS — E039 Hypothyroidism, unspecified: Secondary | ICD-10-CM

## 2011-04-17 DIAGNOSIS — N400 Enlarged prostate without lower urinary tract symptoms: Secondary | ICD-10-CM

## 2011-04-17 DIAGNOSIS — E785 Hyperlipidemia, unspecified: Secondary | ICD-10-CM

## 2011-04-17 DIAGNOSIS — G40909 Epilepsy, unspecified, not intractable, without status epilepticus: Secondary | ICD-10-CM

## 2011-04-17 MED ORDER — LEVOTHYROXINE SODIUM 125 MCG PO TABS: 125.0000 ug | ORAL_TABLET | Freq: Every day | ORAL | Status: DC

## 2011-04-17 NOTE — Patient Instructions (Signed)
Come back for labs in 6 weeks.  Increase you thyroid medicine- I sent in the higher dose.  Take care. Let me know if you have other concerns.  I would recheck your other labs at a physical next year.

## 2011-04-17 NOTE — Progress Notes (Signed)
SZ d/o.  Compliant with meds. No SZ since 2004.  Doing well.  Had routine labs checked at [facility].  No new neuro sx.  Elevated Cholesterol: Using medications without problems:yes Muscle aches: no Diet compliance:yes Exercise:some Other complaints:no  BPH:  Initial stream okay, some dribbling. No pain with urination. PSA d/w pt.   Hypothyroid; compliant with meds. No ant neck pain. No dysphagia. No ADE from the replacement.  Labs d/w pt.  TSH mildly elevated.   PMH and SH reviewed  Meds, vitals, and allergies reviewed.   ROS: See HPI.  Otherwise negative.    GEN: nad, alert and oriented HEENT: mucous membranes moist NECK: supple w/o LA, no TMG CV: rrr. PULM: ctab, no inc wob ABD: soft, +bs EXT: no edema SKIN: no acute rash CN 2-12 wnl B, S/S/DTR wnl x4, faint tremor noted, at baseline Prostate gland firm and smooth, symmetric enlargement, but no nodularity, tenderness, mass, asymmetry or induration. Stool heme neg.

## 2011-04-18 DIAGNOSIS — E785 Hyperlipidemia, unspecified: Secondary | ICD-10-CM | POA: Insufficient documentation

## 2011-04-18 NOTE — Assessment & Plan Note (Signed)
Continue diet/exercise, no change in meds.  Labs d/w pt.

## 2011-04-18 NOTE — Assessment & Plan Note (Signed)
Continue current meds, controlled, prev neuro note reviewed.

## 2011-04-18 NOTE — Assessment & Plan Note (Signed)
Inc replacement, recheck TSH in 6 weeks.  D/w pt.

## 2011-04-18 NOTE — Assessment & Plan Note (Signed)
Enlarged but symm prostate with PSA not elevated.

## 2011-05-29 ENCOUNTER — Other Ambulatory Visit (INDEPENDENT_AMBULATORY_CARE_PROVIDER_SITE_OTHER): Payer: Medicare Other

## 2011-05-29 DIAGNOSIS — E039 Hypothyroidism, unspecified: Secondary | ICD-10-CM

## 2011-07-10 ENCOUNTER — Other Ambulatory Visit: Payer: Self-pay | Admitting: *Deleted

## 2011-07-10 MED ORDER — METOPROLOL TARTRATE 25 MG PO TABS: ORAL_TABLET | ORAL | Status: DC

## 2011-11-15 ENCOUNTER — Ambulatory Visit (INDEPENDENT_AMBULATORY_CARE_PROVIDER_SITE_OTHER): Payer: Medicare Other | Admitting: Family Medicine

## 2011-11-15 ENCOUNTER — Encounter: Payer: Self-pay | Admitting: Family Medicine

## 2011-11-15 VITALS — BP 126/78 | HR 80 | Temp 98.6°F | Wt 211.2 lb

## 2011-11-15 DIAGNOSIS — G40909 Epilepsy, unspecified, not intractable, without status epilepticus: Secondary | ICD-10-CM

## 2011-11-15 DIAGNOSIS — N419 Inflammatory disease of prostate, unspecified: Secondary | ICD-10-CM

## 2011-11-15 DIAGNOSIS — R350 Frequency of micturition: Secondary | ICD-10-CM

## 2011-11-15 LAB — POCT URINALYSIS DIPSTICK: Ketones, UA: NEGATIVE

## 2011-11-15 MED ORDER — CIPROFLOXACIN HCL 500 MG PO TABS: 500.0000 mg | ORAL_TABLET | Freq: Two times a day (BID) | ORAL | Status: AC

## 2011-11-15 NOTE — Patient Instructions (Signed)
We'll contact you with your lab report. Take the cipro twice a day.  Take care.  This should gradually improve.

## 2011-11-15 NOTE — Progress Notes (Signed)
Needs DOT forms done. No seizures since 2004.  Compliant with meds w/o complication or ADE.  Doing well.  See scanned forms.    Fever started last Friday night.  Sweats at night. Some dysuria, burning with urination.  H/o prostatitis.  He's been dribbling some.  Fever is better today.  No blood in stool, diarrhea, but occ and mild cough.  Some fatigue.  If drinking caffeine, the burning with urination is worse.    PMH and SH reviewed  ROS: See HPI, otherwise noncontributory.  Meds, vitals, and allergies reviewed.   nad ncat Mmm rrr ctab abd soft not ttp, normal BS Prostate ttp and boggy CN 2-12 wnl B, S/S wnl x4  U/a is noted, ucx pending.

## 2011-11-16 ENCOUNTER — Encounter: Payer: Self-pay | Admitting: Family Medicine

## 2011-11-16 DIAGNOSIS — N410 Acute prostatitis: Secondary | ICD-10-CM | POA: Insufficient documentation

## 2011-11-16 NOTE — Assessment & Plan Note (Addendum)
W/o symptoms for nearly a decade and doing well, compliant with meds.  No ADE.  See scanned forms.  Should be able to drive as long as eye clinic agrees.  Continue current meds.  >25 min spent with face to face with patient, >50% counseling and/or coordinating care

## 2011-11-16 NOTE — Assessment & Plan Note (Signed)
Nontoxic, start cipro, check ucx.  This should gradually improve.  He reports similar episode prev that responded to abx.  Okay for outpatient f/u.  No other focal source of infection noted by hx or exam.

## 2011-11-17 LAB — URINE CULTURE: Colony Count: NO GROWTH

## 2012-02-05 ENCOUNTER — Encounter: Payer: Self-pay | Admitting: Family Medicine

## 2012-02-05 ENCOUNTER — Ambulatory Visit (INDEPENDENT_AMBULATORY_CARE_PROVIDER_SITE_OTHER): Payer: Medicare Other | Admitting: Family Medicine

## 2012-02-05 VITALS — BP 132/90 | HR 74 | Temp 97.9°F | Wt 205.0 lb

## 2012-02-05 DIAGNOSIS — S61219A Laceration without foreign body of unspecified finger without damage to nail, initial encounter: Secondary | ICD-10-CM | POA: Insufficient documentation

## 2012-02-05 DIAGNOSIS — S6990XA Unspecified injury of unspecified wrist, hand and finger(s), initial encounter: Secondary | ICD-10-CM

## 2012-02-05 DIAGNOSIS — S61209A Unspecified open wound of unspecified finger without damage to nail, initial encounter: Secondary | ICD-10-CM

## 2012-02-05 DIAGNOSIS — Z23 Encounter for immunization: Secondary | ICD-10-CM

## 2012-02-05 DIAGNOSIS — S6980XA Other specified injuries of unspecified wrist, hand and finger(s), initial encounter: Secondary | ICD-10-CM

## 2012-02-05 NOTE — Patient Instructions (Addendum)
Keep your hand elevated, clean and dry.  Take tylenol for pain and recheck Thursday AM.

## 2012-02-05 NOTE — Progress Notes (Signed)
Cut R 3rd finger on moving lawnmower blade today, about 1 hour before presentation.  He cleaned the area after the cut.  Here for eval.    Meds, vitals, and allergies reviewed.   ROS: See HPI.  Otherwise, noncontributory.  nad R hand with normal inspection except for distal 3rd finger with vertical incision, <1cm, perpendicular to the distal portion of the nail.  No nail avulsion, but the lac borders the distal nail.  No FB seen in inspection.  Copiously irrigated with saline and betadine.    No tendon deficit.

## 2012-02-05 NOTE — Assessment & Plan Note (Signed)
tdap given today.  Not enough of a flap to suture.  Wound is superficial.  Wound reapproximated with good closure with steristrips and then splinted/bandaged to protect the steristrips.  Will recheck in 3 days, sooner prn.  Keep hand elevated and dry in meantime.  He agrees.

## 2012-02-08 ENCOUNTER — Ambulatory Visit (INDEPENDENT_AMBULATORY_CARE_PROVIDER_SITE_OTHER): Payer: Medicare Other | Admitting: Family Medicine

## 2012-02-08 ENCOUNTER — Encounter: Payer: Self-pay | Admitting: Family Medicine

## 2012-02-08 VITALS — BP 116/70 | HR 96 | Temp 98.4°F | Wt 213.0 lb

## 2012-02-08 DIAGNOSIS — S61209A Unspecified open wound of unspecified finger without damage to nail, initial encounter: Secondary | ICD-10-CM

## 2012-02-08 DIAGNOSIS — S61219A Laceration without foreign body of unspecified finger without damage to nail, initial encounter: Secondary | ICD-10-CM

## 2012-02-08 NOTE — Assessment & Plan Note (Signed)
Steri strips left on.  Trim edges as they peel up.  Can cover if needed.  Splint prn to protect the finger.  Appears to be healing well.  F/u prn.  He agrees. See instructions.  No charge.

## 2012-02-08 NOTE — Patient Instructions (Addendum)
Use the splint as needed to protect your finger.  As the white strips peel up, trim the edges/ends. When the last part of the white strips are up, then you can cover with a bandaid and neosporin if needed. Take care. No charge.

## 2012-02-08 NOTE — Progress Notes (Signed)
Wound check.  Doing well.  No pain.  No complaints.  Wore the splint.   Meds, vitals, and allergies reviewed.   ROS: See HPI.  Otherwise, noncontributory.  Bandage removed.   Able to make a fist, no tendon deficit.  Tissue well opposed with the steri strips.   No erythema or pus

## 2012-04-08 ENCOUNTER — Other Ambulatory Visit: Payer: Self-pay | Admitting: Family Medicine

## 2012-04-10 ENCOUNTER — Other Ambulatory Visit: Payer: Self-pay | Admitting: Family Medicine

## 2012-04-10 DIAGNOSIS — Z125 Encounter for screening for malignant neoplasm of prostate: Secondary | ICD-10-CM

## 2012-04-10 DIAGNOSIS — E039 Hypothyroidism, unspecified: Secondary | ICD-10-CM

## 2012-04-10 DIAGNOSIS — E7800 Pure hypercholesterolemia, unspecified: Secondary | ICD-10-CM

## 2012-04-15 ENCOUNTER — Other Ambulatory Visit (INDEPENDENT_AMBULATORY_CARE_PROVIDER_SITE_OTHER): Payer: Medicare Other

## 2012-04-15 DIAGNOSIS — Z125 Encounter for screening for malignant neoplasm of prostate: Secondary | ICD-10-CM

## 2012-04-15 DIAGNOSIS — E7800 Pure hypercholesterolemia, unspecified: Secondary | ICD-10-CM

## 2012-04-15 DIAGNOSIS — E039 Hypothyroidism, unspecified: Secondary | ICD-10-CM

## 2012-04-15 LAB — COMPREHENSIVE METABOLIC PANEL
Albumin: 3.7 g/dL (ref 3.5–5.2)
Alkaline Phosphatase: 47 U/L (ref 39–117)
BUN: 16 mg/dL (ref 6–23)
CO2: 29 mEq/L (ref 19–32)
Calcium: 9.5 mg/dL (ref 8.4–10.5)
Chloride: 106 mEq/L (ref 96–112)
GFR: 118.13 mL/min (ref >60.00)
Glucose, Bld: 88 mg/dL (ref 70–99)
Potassium: 5.2 mEq/L — ABNORMAL HIGH (ref 3.5–5.1)
Sodium: 140 mEq/L (ref 135–145)
Total Protein: 7 g/dL (ref 6.0–8.3)

## 2012-04-15 LAB — LIPID PANEL
Cholesterol: 181 mg/dL (ref 0–200)
VLDL: 32.4 mg/dL (ref 0.0–40.0)

## 2012-04-15 LAB — TSH: TSH: 3.73 u[IU]/mL (ref 0.35–5.50)

## 2012-04-15 LAB — PSA: PSA: 3.3 ng/mL (ref 0.10–4.00)

## 2012-04-22 ENCOUNTER — Ambulatory Visit (INDEPENDENT_AMBULATORY_CARE_PROVIDER_SITE_OTHER): Payer: Medicare Other | Admitting: Family Medicine

## 2012-04-22 ENCOUNTER — Encounter: Payer: Self-pay | Admitting: Family Medicine

## 2012-04-22 VITALS — BP 124/86 | HR 76 | Temp 98.4°F | Ht 70.75 in | Wt 219.0 lb

## 2012-04-22 DIAGNOSIS — E039 Hypothyroidism, unspecified: Secondary | ICD-10-CM

## 2012-04-22 DIAGNOSIS — E785 Hyperlipidemia, unspecified: Secondary | ICD-10-CM

## 2012-04-22 DIAGNOSIS — N400 Enlarged prostate without lower urinary tract symptoms: Secondary | ICD-10-CM

## 2012-04-22 DIAGNOSIS — I10 Essential (primary) hypertension: Secondary | ICD-10-CM

## 2012-04-22 DIAGNOSIS — G40909 Epilepsy, unspecified, not intractable, without status epilepticus: Secondary | ICD-10-CM

## 2012-04-22 DIAGNOSIS — Z0000 Encounter for general adult medical examination without abnormal findings: Secondary | ICD-10-CM

## 2012-04-22 NOTE — Progress Notes (Signed)
I have personally reviewed the Medicare Annual Wellness questionnaire and have noted 1. The patient's medical and social history 2. Their use of alcohol, tobacco or illicit drugs 3. Their current medications and supplements 4. The patient's functional ability including ADL's, fall risks, home safety risks and hearing or visual             impairment. 5. Diet and physical activities 6. Evidence for depression or mood disorders  The patients weight, height, BMI have been recorded in the chart and visual acuity is per eye clinic.  I have made referrals, counseling and provided education to the patient based review of the above and I have provided the pt with a written personalized care plan for preventive services.  See scanned forms.  Routine anticipatory guidance given to patient.  See health maintenance. Tetanus 2013 Flu 2013 Shingles due at 59 PNA due at 30 Colonoscopy 2004 Prostate cancer screening- PSA wnl but higher than prev.  See instructions.  Recheck in 6 months.   Living will- has a living will.  He can drop off a copy.    BPH, inc in PSA but still <4. FH prostate cancer.  Good stream initially but dribbling terminally.  No burning with urination.    SZ- last seen by neuro in 8/13.  Last SZ 2004.  Doing well with meds except for some fatigue.  Compliant with meds.  Last neuro note reviewed.   Hypothyroid.   No neck mass, dysphagia, ant neck pain.  Compliant with meds.   Hypertension:    Using medication without problems or lightheadedness: yes Chest pain with exertion:no Edema:no Short of breath: only as expected with exertion, ie using with a push mower in a hilly yard after about 1 hour.    PMH and SH reviewed  Meds, vitals, and allergies reviewed.   ROS: See HPI.  Otherwise negative.    GEN: nad, alert and oriented HEENT: mucous membranes moist NECK: supple w/o LA CV: rrr. PULM: ctab, no inc wob ABD: soft, +bs EXT: no edema SKIN: no acute rash Prostate gland  firm and smooth, symmetric enlargement, but no nodularity, tenderness, mass, asymmetry or induration.

## 2012-04-22 NOTE — Patient Instructions (Signed)
Drop off a copy of your living will.  We can scan it in.   Recheck PSA in 6 months at a lab visit.   Take care.  Keep exercising.  Glad to see you.

## 2012-04-23 ENCOUNTER — Encounter: Payer: Self-pay | Admitting: Family Medicine

## 2012-04-23 DIAGNOSIS — Z0000 Encounter for general adult medical examination without abnormal findings: Secondary | ICD-10-CM | POA: Insufficient documentation

## 2012-04-23 NOTE — Assessment & Plan Note (Signed)
See scanned forms.  Routine anticipatory guidance given to patient.  See health maintenance. Tetanus 2013 Flu 2013 Shingles due at 59 PNA due at 57 Colonoscopy 2004 Prostate cancer screening- PSA wnl but higher than prev.  See instructions.  Recheck in 6 months.   Living will- has a living will.  He can drop off a copy.

## 2012-04-23 NOTE — Assessment & Plan Note (Signed)
Continue as is but recheck PSA in 6 months.  If continues to inc, then consider referral to uro.  He agrees.

## 2012-04-23 NOTE — Assessment & Plan Note (Signed)
Controlled, continue diet and exercise, no change in meds.

## 2012-04-23 NOTE — Assessment & Plan Note (Signed)
Per neuro.  Doing well.  

## 2012-04-23 NOTE — Assessment & Plan Note (Signed)
Controlled, continue replacement.  Not TMG on exam.

## 2012-04-23 NOTE — Assessment & Plan Note (Signed)
Continue diet and exercise, taking max tolerated dose of statin.

## 2012-05-06 ENCOUNTER — Other Ambulatory Visit: Payer: Self-pay | Admitting: *Deleted

## 2012-05-06 MED ORDER — LEVOTHYROXINE SODIUM 125 MCG PO TABS: 125.0000 ug | ORAL_TABLET | Freq: Every day | ORAL | Status: DC

## 2012-05-08 ENCOUNTER — Other Ambulatory Visit: Payer: Self-pay

## 2012-05-08 MED ORDER — METOPROLOL TARTRATE 25 MG PO TABS: 25.0000 mg | ORAL_TABLET | Freq: Every day | ORAL | Status: DC

## 2012-05-08 NOTE — Telephone Encounter (Signed)
Pt left v/m that pt takes Metoprolol tartrate 25 mg one daily.Please advise.

## 2012-05-08 NOTE — Telephone Encounter (Signed)
Sent!

## 2012-05-08 NOTE — Telephone Encounter (Signed)
[facility] faxed refill metoprolol tartrate 25 mg with instructions take 1/2 tab by mouth twice a day. Med list has take one tab daily. Left message for pt to call with instructions of how pt taking.

## 2012-05-30 ENCOUNTER — Ambulatory Visit (INDEPENDENT_AMBULATORY_CARE_PROVIDER_SITE_OTHER): Payer: Medicare Other | Admitting: Family Medicine

## 2012-05-30 ENCOUNTER — Encounter: Payer: Self-pay | Admitting: Family Medicine

## 2012-05-30 VITALS — BP 120/84 | HR 61 | Temp 98.4°F | Ht 70.75 in | Wt 223.0 lb

## 2012-05-30 DIAGNOSIS — S6010XA Contusion of unspecified finger with damage to nail, initial encounter: Secondary | ICD-10-CM

## 2012-05-30 DIAGNOSIS — S6000XA Contusion of unspecified finger without damage to nail, initial encounter: Secondary | ICD-10-CM
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
## 2012-07-29 ENCOUNTER — Ambulatory Visit (INDEPENDENT_AMBULATORY_CARE_PROVIDER_SITE_OTHER): Payer: Medicare Other | Admitting: Family Medicine

## 2012-07-29 ENCOUNTER — Encounter: Payer: Self-pay | Admitting: Family Medicine

## 2012-07-29 VITALS — BP 142/88 | HR 78 | Temp 98.6°F | Wt 223.0 lb

## 2012-07-29 DIAGNOSIS — J019 Acute sinusitis, unspecified: Secondary | ICD-10-CM

## 2012-07-29 MED ORDER — AMOXICILLIN-POT CLAVULANATE 875-125 MG PO TABS: 1.0000 | ORAL_TABLET | Freq: Two times a day (BID) | ORAL | Status: DC

## 2012-07-29 NOTE — Patient Instructions (Addendum)
Start the antibiotics today, use nasal saline and drink plenty of fluids.   Take care.

## 2012-07-29 NOTE — Assessment & Plan Note (Signed)
Nontoxic, augmentin, nasal saline, fluids, f/u prn.  D/w pt.  He agrees.

## 2012-07-29 NOTE — Progress Notes (Signed)
1 week of sx, started with ST then diarrhea.  Over the last week, fatigue and malaise, then aches and fevers.  Discolored nasal discharge, now with cough and discolored sputum.  He vomited last night.  Aches are worse at night.  Feels feverish at night.  ST is better now.  Ribs are sore from coughing B.  Taking tylenol and robitussin w/o much help. He had a flu shot.  Upper tooth pain and maxillary pressure/pain, worse at night.    Meds, vitals, and allergies reviewed.   ROS: See HPI.  Otherwise, noncontributory.  GEN: nad, alert and oriented HEENT: mucous membranes moist, tm w/o erythema, nasal exam w/o erythema, clear discharge noted,  OP with cobblestoning, B max sinuses ttp NECK: supple w/o LA CV: rrr.   PULM: ctab, no inc wob EXT: no edema

## 2012-10-21 ENCOUNTER — Other Ambulatory Visit (INDEPENDENT_AMBULATORY_CARE_PROVIDER_SITE_OTHER): Payer: Medicare Other

## 2012-10-21 DIAGNOSIS — N400 Enlarged prostate without lower urinary tract symptoms: Secondary | ICD-10-CM

## 2012-12-10 ENCOUNTER — Encounter: Payer: Self-pay | Admitting: Gastroenterology

## 2012-12-16 ENCOUNTER — Encounter: Payer: Self-pay | Admitting: Gastroenterology

## 2013-02-11 ENCOUNTER — Ambulatory Visit (AMBULATORY_SURGERY_CENTER): Payer: Medicare Other

## 2013-02-11 ENCOUNTER — Encounter: Payer: Self-pay | Admitting: Gastroenterology

## 2013-02-11 VITALS — Ht 71.0 in | Wt 224.4 lb

## 2013-02-11 DIAGNOSIS — Z1211 Encounter for screening for malignant neoplasm of colon: Secondary | ICD-10-CM

## 2013-02-11 MED ORDER — SUPREP BOWEL PREP KIT 17.5-3.13-1.6 GM/177ML PO SOLN: 1.0000 | Freq: Once | ORAL | Status: DC

## 2013-02-12 ENCOUNTER — Ambulatory Visit (INDEPENDENT_AMBULATORY_CARE_PROVIDER_SITE_OTHER): Payer: Medicare Other | Admitting: Nurse Practitioner

## 2013-02-12 ENCOUNTER — Encounter: Payer: Self-pay | Admitting: Nurse Practitioner

## 2013-02-12 VITALS — BP 139/86 | HR 63 | Ht 71.75 in | Wt 226.0 lb

## 2013-02-12 DIAGNOSIS — Z79899 Other long term (current) drug therapy: Secondary | ICD-10-CM | POA: Insufficient documentation

## 2013-02-12 DIAGNOSIS — G40309 Generalized idiopathic epilepsy and epileptic syndromes, not intractable, without status epilepticus: Secondary | ICD-10-CM | POA: Insufficient documentation

## 2013-02-12 DIAGNOSIS — G40209 Localization-related (focal) (partial) symptomatic epilepsy and epileptic syndromes with complex partial seizures, not intractable, without status epilepticus: Secondary | ICD-10-CM | POA: Insufficient documentation

## 2013-02-12 LAB — VALPROIC ACID LEVEL: Valproic Acid Lvl: 96 ug/mL (ref 50–100)

## 2013-02-12 MED ORDER — DIVALPROEX SODIUM ER 500 MG PO TB24: ORAL_TABLET | ORAL | Status: DC

## 2013-02-12 NOTE — Progress Notes (Signed)
  Reason for visit followup for seizure disorder HPI: [name], 52 year old white male returns today for followup. He has a history of seizure disorder, Generalized tonic-clonic seizure, sometimes preceding by severe migraine headaches, started since 2000,  His mother also had epilepsy disorder.   His last seizure occurred in December of 2004. He is currently on Depakote tolerating the medication without side effects.  He lives by himself, on disability.He only has 1-2 major migraine each year.     ROS:  Negative except for blurred vision, tremor, shortness of breath   Medications Current Outpatient Prescriptions on File Prior to Visit  Medication Sig Dispense Refill  . Benzoyl Peroxide (DEL-AQUA) 10 % CREA Apply to face at night.       . betamethasone dipropionate (DIPROLENE) 0.05 % cream Apply topically 2 (two) times daily as needed.       . divalproex (DEPAKOTE ER) 500 MG 24 hr tablet Take 2 tablets by mouth twice daily; except Sunday and Wednesday only take 1 in Am and 2 in Pm      . fexofenadine (ALLEGRA) 180 MG tablet Take 180 mg by mouth daily as needed.        Marland Kitchen levothyroxine (SYNTHROID, LEVOTHROID) 125 MCG tablet Take 1 tablet (125 mcg total) by mouth daily.  90 tablet  3  . metoprolol tartrate (LOPRESSOR) 25 MG tablet Take 1 tablet (25 mg total) by mouth daily.  90 tablet  3  . Multiple Vitamin (MULTIVITAMIN) tablet Take 1 tablet by mouth daily.        . pravastatin (PRAVACHOL) 40 MG tablet Take one tablet by mouth Monday, Wednesday and Friday.      . SAW PALMETTO, SERENOA REPENS, PO Take by mouth.      Rolene Arbour BOWEL PREP SOLN Take 1 kit by mouth once.  354 mL  0   No current facility-administered medications on file prior to visit.    Allergies  Allergies  Allergen Reactions  . Pravastatin Sodium     REACTION: tolerates 40mg  MWF but aches at higher dose or more frequent dosing    Physical Exam General: well developed, well nourished, seated, in no evident  distress Head: head normocephalic and atraumatic. Oropharynx benign Neck: supple with no carotid  bruits Cardiovascular: regular rate and rhythm, no murmurs  Neurologic Exam Mental Status: Awake and fully alert. Oriented to place and time. Follows all commands. Speech and language normal.   Cranial Nerves: Fundoscopic exam reveals sharp disc margins. Pupils equal, briskly reactive to light. Extraocular movements full without nystagmus. Visual fields full to confrontation. Hearing intact and symmetric to finger snap. Facial sensation intact. Face, tongue, palate move normally and symmetrically. Neck flexion and extension normal.  Motor: Normal bulk and tone. Normal strength in all tested extremity muscles.No focal weakness Coordination: Rapid alternating movements normal in all extremities. Finger-to-nose and heel-to-shin performed accurately bilaterally. No dysmetria Gait and Station: Arises from chair without difficulty. Stance is normal. Gait demonstrates normal stride length and balance . Able to heel, toe and tandem walk without difficulty.  Reflexes: 2+ and symmetric. Toes downgoing.     ASSESSMENT: Generalized seizure disorder with last seizure occurring in 2004, currently on Depakote without side effects     PLAN: Will obtain CBC, CMP, and Depakote level Will renew Depakote prescription Follow up yearly and when necessary Nilda Riggs, GNP-BC APRN

## 2013-02-12 NOTE — Patient Instructions (Addendum)
Continue Depakote at current dose will refill F/U yearly

## 2013-02-14 NOTE — Progress Notes (Signed)
I reviewed note and agree with plan.   Marli Diego R. Madalyne Husk, MD 02/14/2013, 5:05 PM Certified in Neurology, Neurophysiology and Neuroimaging  [facility] [address], [address] [place], [place] [address] [phone]  

## 2013-02-14 NOTE — Progress Notes (Signed)
Quick Note:  Left a message on the patient's vm relaying the results of their recent labs. Contact information was also given so that the patient may call back if they have any questions. ______ 

## 2013-02-25 ENCOUNTER — Ambulatory Visit (AMBULATORY_SURGERY_CENTER): Payer: Medicare Other | Admitting: Gastroenterology

## 2013-02-25 ENCOUNTER — Encounter: Payer: Self-pay | Admitting: Gastroenterology

## 2013-02-25 VITALS — BP 151/84 | HR 50 | Temp 97.2°F | Resp 19 | Ht 71.0 in | Wt 224.0 lb

## 2013-02-25 DIAGNOSIS — D126 Benign neoplasm of colon, unspecified: Secondary | ICD-10-CM

## 2013-02-25 DIAGNOSIS — Z1211 Encounter for screening for malignant neoplasm of colon: Secondary | ICD-10-CM

## 2013-02-25 MED ORDER — SODIUM CHLORIDE 0.9 % IV SOLN: 500.0000 mL | INTRAVENOUS | Status: DC

## 2013-02-25 NOTE — Progress Notes (Signed)
Patient did not experience any of the following events: a burn prior to discharge; a fall within the facility; wrong site/side/patient/procedure/implant event; or a hospital transfer or hospital admission upon discharge from the facility. (G8907) Patient did not have preoperative order for IV antibiotic SSI prophylaxis. (G8918)  

## 2013-02-25 NOTE — Op Note (Signed)
[facility] [address] [place], [address]   COLONOSCOPY PROCEDURE REPORT  PATIENT: Kevin, Charles  MR#: [id number] BIRTHDATE: September 15, 1960 , 51  yrs. old GENDER: Male ENDOSCOPIST: Louis Meckel, MD REFERRED XB:JYNWGN Duncan, M.D. PROCEDURE DATE:  02/25/2013 PROCEDURE:   Colonoscopy with snare polypectomy First Screening Colonoscopy - Avg.  risk and is 50 yrs.  old or older - No.      History of Adenoma - Now for follow-up colonoscopy & has been > or = to 3 yrs.  N/A  Polyps Removed Today? Yes. ASA CLASS:   Class II INDICATIONS:Average risk patient for colon cancer. MEDICATIONS: MAC sedation, administered by CRNA and propofol (Diprivan) 200mg  IV  DESCRIPTION OF PROCEDURE:   After the risks benefits and alternatives of the procedure were thoroughly explained, informed consent was obtained.  A digital rectal exam revealed no abnormalities of the rectum.   The LB CF-H180AL Loaner [id number] endoscope was introduced through the anus and advanced to the cecum, which was identified by both the appendix and ileocecal valve. No adverse events experienced.   The quality of the prep was excellent using Suprep  The instrument was then slowly withdrawn as the colon was fully examined.      COLON FINDINGS: A sessile polyp measuring 3 mm in size was found at the hepatic flexure.  A polypectomy was performed.  The resection was complete and the polyp tissue was completely retrieved.   The colon mucosa was otherwise normal.  Retroflexed views revealed no abnormalities. The time to cecum=6 minutes 01 seconds.  Withdrawal time=16 minutes 15 seconds.  The scope was withdrawn and the procedure completed. COMPLICATIONS: There were no complications.  ENDOSCOPIC IMPRESSION: 1.   Sessile polyp measuring 3 mm in size was found at the hepatic flexure; polypectomy was performed 2.   The colon mucosa was otherwise normal  RECOMMENDATIONS: If the polyp(s) removed today are  proven to be adenomatous (pre-cancerous) polyps, you will need a repeat colonoscopy in 5 years.  Otherwise you should continue to follow colorectal cancer screening guidelines for "routine risk" patients with colonoscopy in 10 years.  You will receive a letter within 1-2 weeks with the results of your biopsy as well as final recommendations.  Please call my office if you have not received a letter after 3 weeks.   eSigned:  Louis Meckel, MD 02/25/2013 10:55 AM   cc:   PATIENT NAME:  Kevin, Charles MR#: [id number]

## 2013-02-25 NOTE — Patient Instructions (Addendum)

## 2013-02-25 NOTE — Progress Notes (Signed)
Called to room to assist during endoscopic procedure.  Patient ID and intended procedure confirmed with present staff. Received instructions for my participation in the procedure from the performing physician. ewm 

## 2013-02-26 ENCOUNTER — Telehealth: Payer: Self-pay | Admitting: *Deleted

## 2013-02-26 NOTE — Telephone Encounter (Signed)
  Follow up Call-  Call back number 02/25/2013  Post procedure Call Back phone  # [phone]  Permission to leave phone message Yes     Patient questions:  Do you have a fever, pain , or abdominal swelling? no Pain Score  0 *  Have you tolerated food without any problems? yes  Have you been able to return to your normal activities? yes  Do you have any questions about your discharge instructions: Diet   no Medications  no Follow up visit  no  Do you have questions or concerns about your Care? no  Actions: * If pain score is 4 or above: No action needed, pain <4.

## 2013-03-07 ENCOUNTER — Encounter: Payer: Self-pay | Admitting: Gastroenterology

## 2013-04-19 ENCOUNTER — Other Ambulatory Visit: Payer: Self-pay | Admitting: Family Medicine

## 2013-04-20 ENCOUNTER — Other Ambulatory Visit: Payer: Self-pay | Admitting: Family Medicine

## 2013-04-20 DIAGNOSIS — I10 Essential (primary) hypertension: Secondary | ICD-10-CM

## 2013-04-20 DIAGNOSIS — E039 Hypothyroidism, unspecified: Secondary | ICD-10-CM

## 2013-04-20 DIAGNOSIS — N400 Enlarged prostate without lower urinary tract symptoms: Secondary | ICD-10-CM

## 2013-04-22 ENCOUNTER — Other Ambulatory Visit (INDEPENDENT_AMBULATORY_CARE_PROVIDER_SITE_OTHER): Payer: Medicare Other

## 2013-04-22 DIAGNOSIS — E039 Hypothyroidism, unspecified: Secondary | ICD-10-CM

## 2013-04-22 DIAGNOSIS — E785 Hyperlipidemia, unspecified: Secondary | ICD-10-CM

## 2013-04-22 DIAGNOSIS — I10 Essential (primary) hypertension: Secondary | ICD-10-CM

## 2013-04-22 DIAGNOSIS — N400 Enlarged prostate without lower urinary tract symptoms: Secondary | ICD-10-CM

## 2013-04-22 LAB — COMPREHENSIVE METABOLIC PANEL
Albumin: 4.2 g/dL (ref 3.5–5.2)
Alkaline Phosphatase: 57 U/L (ref 39–117)
CO2: 28 mEq/L (ref 19–32)
Calcium: 10.1 mg/dL (ref 8.4–10.5)
Chloride: 102 mEq/L (ref 96–112)
GFR: 98.93 mL/min (ref >60.00)
Glucose, Bld: 87 mg/dL (ref 70–99)
Potassium: 5.2 mEq/L — ABNORMAL HIGH (ref 3.5–5.1)
Sodium: 138 mEq/L (ref 135–145)
Total Protein: 7.4 g/dL (ref 6.0–8.3)

## 2013-04-22 LAB — LIPID PANEL
Cholesterol: 253 mg/dL — ABNORMAL HIGH (ref 0–200)
HDL: 35.5 mg/dL — ABNORMAL LOW (ref >39.00)
Total CHOL/HDL Ratio: 7

## 2013-04-22 LAB — LDL CHOLESTEROL, DIRECT: Direct LDL: 183 mg/dL

## 2013-04-22 LAB — TSH: TSH: 8.25 u[IU]/mL — ABNORMAL HIGH (ref 0.35–5.50)

## 2013-04-29 ENCOUNTER — Ambulatory Visit (INDEPENDENT_AMBULATORY_CARE_PROVIDER_SITE_OTHER): Payer: Medicare Other | Admitting: Family Medicine

## 2013-04-29 ENCOUNTER — Encounter: Payer: Self-pay | Admitting: Family Medicine

## 2013-04-29 VITALS — BP 142/86 | HR 62 | Temp 98.2°F | Ht 71.0 in | Wt 228.0 lb

## 2013-04-29 DIAGNOSIS — J069 Acute upper respiratory infection, unspecified: Secondary | ICD-10-CM

## 2013-04-29 DIAGNOSIS — G43909 Migraine, unspecified, not intractable, without status migrainosus: Secondary | ICD-10-CM

## 2013-04-29 DIAGNOSIS — E039 Hypothyroidism, unspecified: Secondary | ICD-10-CM

## 2013-04-29 DIAGNOSIS — E785 Hyperlipidemia, unspecified: Secondary | ICD-10-CM

## 2013-04-29 DIAGNOSIS — G40309 Generalized idiopathic epilepsy and epileptic syndromes, not intractable, without status epilepticus: Secondary | ICD-10-CM

## 2013-04-29 DIAGNOSIS — Z0000 Encounter for general adult medical examination without abnormal findings: Secondary | ICD-10-CM

## 2013-04-29 MED ORDER — METOPROLOL TARTRATE 25 MG PO TABS: 25.0000 mg | ORAL_TABLET | Freq: Every day | ORAL | Status: DC

## 2013-04-29 MED ORDER — PRAVASTATIN SODIUM 40 MG PO TABS: ORAL_TABLET | ORAL | Status: DC

## 2013-04-29 MED ORDER — LEVOTHYROXINE SODIUM 137 MCG PO TABS: 137.0000 ug | ORAL_TABLET | Freq: Every day | ORAL | Status: DC

## 2013-04-29 NOTE — Patient Instructions (Signed)
Recheck TSH in about 2 months.   Higher dose of thyroid medicine in meantime, 1 a day.  Call about an eye exam.  Drink plenty of fluids, take tylenol as needed, and gargle with warm salt water for your throat.  This should gradually improve.  Take care.  Let us know if you have other concerns.

## 2013-04-29 NOTE — Progress Notes (Signed)
Pre-visit discussion using our clinic review tool. No additional management support is needed unless otherwise documented below in the visit note.  I have personally reviewed the Medicare Annual Wellness questionnaire and have noted 1. The patient's medical and social history 2. Their use of alcohol, tobacco or illicit drugs 3. Their current medications and supplements 4. The patient's functional ability including ADL's, fall risks, home safety risks and hearing or visual             impairment. 5. Diet and physical activities 6. Evidence for depression or mood disorders  The patients weight, height, BMI have been recorded in the chart and visual acuity is per eye clinic.  I have made referrals, counseling and provided education to the patient based review of the above and I have provided the pt with a written personalized care plan for preventive services.  See scanned forms.  Routine anticipatory guidance given to patient.  See health maintenance. Flu done fall 2014 Shingles not due yet PNA not due yet Tetanus 2013 Colonoscopy 2014 Prostate cancer screening- PSA wnl Advance directive d/w pt. Would have uncle Damaris Schooner designated, then cousin Cheryl Flash.  Cognitive function addressed- see scanned forms- and if abnormal then additional documentation follows.   No SZ since ~2004. Per neuro.  Compliant with meds.  No ADE.   Elevated Cholesterol: Using medications without problems:yes Muscle aches: no Diet compliance: yes Exercise:yes  Hypothyroid. TSH up.  D/w pt.  No dysphagia.  No TMG.    Occ migraines. ~6 per year. Not frequent.  When he has one, he'll have photophobia.  He takes tylenol, uses an ice pack and that helps.   Recent mild cold sx, mild ST.  No fevers.    PMH and SH reviewed  Meds, vitals, and allergies reviewed.   ROS: See HPI.  Otherwise negative.    GEN: nad, alert and oriented HEENT: mucous membranes moist, tm WNL x2, nasal exam slightly irritated, op  with mild cobblestoning.  NECK: supple w/o LA, no TMG CV: rrr. PULM: ctab, no inc wob ABD: soft, +bs EXT: no edema SKIN: no acute rash

## 2013-04-30 DIAGNOSIS — G43909 Migraine, unspecified, not intractable, without status migrainosus: Secondary | ICD-10-CM | POA: Insufficient documentation

## 2013-04-30 DIAGNOSIS — J069 Acute upper respiratory infection, unspecified: Secondary | ICD-10-CM | POA: Insufficient documentation

## 2013-04-30 NOTE — Assessment & Plan Note (Signed)
  See scanned forms.  Routine anticipatory guidance given to patient.  See health maintenance. Flu done fall 2014 Shingles not due yet PNA not due yet Tetanus 2013 Colonoscopy 2014 Prostate cancer screening- PSA wnl Advance directive d/w pt. Would have uncle Damaris Schooner designated, then cousin Cheryl Flash.  Cognitive function addressed- see scanned forms- and if abnormal then additional documentation follows.

## 2013-04-30 NOTE — Assessment & Plan Note (Signed)
Labs d/w pt.  On max tolerated dose of statin.  Continue med as is with diet and exercise also.

## 2013-04-30 NOTE — Assessment & Plan Note (Signed)
Controlled , no change in meds 

## 2013-04-30 NOTE — Assessment & Plan Note (Signed)
Overall rare sx, continue as is with tylenol prn.  He agrees.

## 2013-04-30 NOTE — Assessment & Plan Note (Signed)
Likely viral, nontoxic, supportive care and f/u prn.  Recently prevalent in the community.  Sinuses not ttp and lungs ctab.

## 2013-04-30 NOTE — Assessment & Plan Note (Signed)
Inc replacement given TSH elevation and recheck in about 2 months at lab visit.

## 2013-05-07 ENCOUNTER — Other Ambulatory Visit: Payer: Self-pay | Admitting: Family Medicine

## 2013-06-23 ENCOUNTER — Encounter: Payer: Self-pay | Admitting: Family Medicine

## 2013-06-23 ENCOUNTER — Ambulatory Visit (INDEPENDENT_AMBULATORY_CARE_PROVIDER_SITE_OTHER): Payer: Medicare Other | Admitting: Family Medicine

## 2013-06-23 ENCOUNTER — Telehealth: Payer: Self-pay

## 2013-06-23 VITALS — BP 130/90 | HR 68 | Temp 98.0°F | Wt 231.5 lb

## 2013-06-23 DIAGNOSIS — J020 Streptococcal pharyngitis: Secondary | ICD-10-CM

## 2013-06-23 DIAGNOSIS — J019 Acute sinusitis, unspecified: Secondary | ICD-10-CM

## 2013-06-23 LAB — POCT RAPID STREP A (OFFICE): RAPID STREP A SCREEN: NEGATIVE

## 2013-06-23 MED ORDER — LIDOCAINE VISCOUS 2 % MT SOLN: 10.0000 mL | OROMUCOSAL | Status: DC | PRN

## 2013-06-23 MED ORDER — AMOXICILLIN-POT CLAVULANATE 875-125 MG PO TABS: 1.0000 | ORAL_TABLET | Freq: Two times a day (BID) | ORAL | Status: DC

## 2013-06-23 NOTE — Telephone Encounter (Signed)
Noted, thanks!

## 2013-06-23 NOTE — Telephone Encounter (Signed)
Pt left v/m; pt was seen earlier today and pt thought was taking ranitidine 75 mg daily as needed; pt said is taking ranitidine 150 mg daily as needed. Med list updated.

## 2013-06-23 NOTE — Progress Notes (Signed)
Pre-visit discussion using our clinic review tool. No additional management support is needed unless otherwise documented below in the visit note.  Recently with GERD sx, much improved/now resolved with 1 week of prn zantac.    Cough, ST, occ fever in the last week.  ST was most bothersome to patient.  L ear pain.  No vomiting, no diarrhea. No rash.  Cough is worse at night, better in the daytime.  Discolored sputum over the last few days.  He isn't getting any better over the last week.    Meds, vitals, and allergies reviewed.   ROS: See HPI.  Otherwise, noncontributory.  GEN: nad, alert and oriented HEENT: mucous membranes moist, tm w/o erythema, nasal exam w/o erythema, clear discharge noted,  OP with cobblestoning, max sinuses notable ttp B NECK: supple w/o LA CV: rrr.   PULM: ctab, no inc wob EXT: no edema SKIN: no acute rash

## 2013-06-23 NOTE — Assessment & Plan Note (Signed)
Tender but nontoxic.  Would treat given the duration and worsening. He agrees.  Lidocaine for ST.  F/u prn.  Supportive care o/w.

## 2013-06-23 NOTE — Patient Instructions (Signed)
Use the lidocaine every 4-6 hours as needed for throat pain and start the antibiotics today.  Take care.  Glad to see you.

## 2013-06-30 ENCOUNTER — Other Ambulatory Visit (INDEPENDENT_AMBULATORY_CARE_PROVIDER_SITE_OTHER): Payer: Medicare Other

## 2013-06-30 DIAGNOSIS — E039 Hypothyroidism, unspecified: Secondary | ICD-10-CM

## 2013-06-30 LAB — TSH: TSH: 6.26 u[IU]/mL — ABNORMAL HIGH (ref 0.35–5.50)

## 2013-07-01 ENCOUNTER — Telehealth: Payer: Self-pay | Admitting: Family Medicine

## 2013-07-01 DIAGNOSIS — E039 Hypothyroidism, unspecified: Secondary | ICD-10-CM

## 2013-07-01 MED ORDER — LEVOTHYROXINE SODIUM 150 MCG PO TABS: 137.0000 ug | ORAL_TABLET | Freq: Every day | ORAL | Status: DC

## 2013-07-01 NOTE — Telephone Encounter (Signed)
Notify pt that thyroid is improved on higher dose but not yet at goal on 137 mcg daily. Recommend increasing further  To 150 mcg daily ( rx sent in)and recheck in 4 weeks. TSH.

## 2013-07-01 NOTE — Telephone Encounter (Signed)
Message copied by Jinny Sanders on Tue Jul 01, 2013  4:24 PM ------      Message from: Josetta Huddle      Created: Tue Jul 01, 2013  3:11 PM                   ----- Message -----         From: [facility]         Sent: 06/30/2013  12:11 PM           To: Tonia Ghent, MD             ------

## 2013-07-01 NOTE — Telephone Encounter (Signed)
Left detailed message on voicemail.  Hold for scheduling lab appt.

## 2013-07-31 ENCOUNTER — Other Ambulatory Visit: Payer: Self-pay | Admitting: Family Medicine

## 2013-07-31 ENCOUNTER — Other Ambulatory Visit (INDEPENDENT_AMBULATORY_CARE_PROVIDER_SITE_OTHER): Payer: Medicare Other

## 2013-07-31 DIAGNOSIS — E039 Hypothyroidism, unspecified: Secondary | ICD-10-CM

## 2013-07-31 LAB — TSH: TSH: 8.18 u[IU]/mL — ABNORMAL HIGH (ref 0.35–5.50)

## 2013-07-31 MED ORDER — LEVOTHYROXINE SODIUM 150 MCG PO TABS: 150.0000 ug | ORAL_TABLET | Freq: Every day | ORAL | Status: DC

## 2013-10-01 ENCOUNTER — Encounter: Payer: Self-pay | Admitting: *Deleted

## 2013-10-01 ENCOUNTER — Other Ambulatory Visit (INDEPENDENT_AMBULATORY_CARE_PROVIDER_SITE_OTHER): Payer: Medicare Other

## 2013-10-01 DIAGNOSIS — E039 Hypothyroidism, unspecified: Secondary | ICD-10-CM

## 2013-10-01 LAB — TSH: TSH: 1.79 u[IU]/mL (ref 0.35–5.50)

## 2013-10-16 ENCOUNTER — Ambulatory Visit (INDEPENDENT_AMBULATORY_CARE_PROVIDER_SITE_OTHER): Payer: Medicare Other | Admitting: Family Medicine

## 2013-10-16 ENCOUNTER — Encounter: Payer: Self-pay | Admitting: Family Medicine

## 2013-10-16 VITALS — BP 132/86 | HR 66 | Temp 97.4°F | Wt 228.2 lb

## 2013-10-16 DIAGNOSIS — L255 Unspecified contact dermatitis due to plants, except food: Secondary | ICD-10-CM

## 2013-10-16 DIAGNOSIS — L237 Allergic contact dermatitis due to plants, except food: Secondary | ICD-10-CM | POA: Insufficient documentation

## 2013-10-16 DIAGNOSIS — I10 Essential (primary) hypertension: Secondary | ICD-10-CM

## 2013-10-16 NOTE — Assessment & Plan Note (Signed)
Resolving

## 2013-10-16 NOTE — Patient Instructions (Signed)
I would pick up the extra prednisone rx to have it in case you need it.  Take prednisone with food.  Don't change your BP medicine.  Take care.  Glad to see you. Schedule a physical for a few months.

## 2013-10-16 NOTE — Progress Notes (Signed)
Pre visit review using our clinic review tool, if applicable. No additional management support is needed unless otherwise documented below in the visit note.  Was helping a neighbor with some yard work, got in [place].  Seen at [facility].  Had to take oral pred and IM steroid injection.  Better now. Using triamcinolone cream now.   BP was 160/100 on home check by insurance MD.  Recently BP was variable on rechecks.  At the UC his BP was 809 systolic, then 983 on recheck- similar today.  No CP, SOB, BLE edema.    Meds, vitals, and allergies reviewed.   ROS: See HPI.  Otherwise, noncontributory.  GEN: nad, alert and oriented HEENT: mucous membranes moist NECK: supple w/o LA CV: rrr.  PULM: ctab, no inc wob ABD: soft, +bs EXT: no edema SKIN: no acute rash, resolving rash on R arm and thigh

## 2013-10-16 NOTE — Assessment & Plan Note (Signed)
Controlled today, continue as is.  His prev elevations may have been from cuff variability or white coat effect.  Pt agrees.

## 2013-11-21 ENCOUNTER — Encounter: Payer: Self-pay | Admitting: Family Medicine

## 2013-11-21 ENCOUNTER — Telehealth: Payer: Self-pay

## 2013-11-21 ENCOUNTER — Ambulatory Visit (INDEPENDENT_AMBULATORY_CARE_PROVIDER_SITE_OTHER): Payer: Medicare Other | Admitting: Family Medicine

## 2013-11-21 VITALS — BP 124/84 | HR 76 | Temp 97.9°F | Wt 229.8 lb

## 2013-11-21 DIAGNOSIS — L255 Unspecified contact dermatitis due to plants, except food: Secondary | ICD-10-CM

## 2013-11-21 MED ORDER — PREDNISONE 20 MG PO TABS: ORAL_TABLET | ORAL | Status: DC

## 2013-11-21 NOTE — Progress Notes (Signed)
Pre visit review using our clinic review tool, if applicable. No additional management support is needed unless otherwise documented below in the visit note.  Poison ivy/oak on R arm and R thigh.  Prednisone by mouth (6d ay pack) helped some but not fully and it came back.  Topical TAC didn't help.  He's had multiple exposures at home. No FCNAV.  Itches.  He tolerates the prednisone by mouth.  Minimal insomnia, unclear if from itching or prednisone.    Meds, vitals, and allergies reviewed.   ROS: See HPI.  Otherwise, noncontributory.  nad Rash on R arm and R thigh, blanching red area.  No excoriation.

## 2013-11-21 NOTE — Patient Instructions (Signed)
Take the prednisone taper with food and this should resolve.  Take care.

## 2013-11-21 NOTE — Telephone Encounter (Signed)
Pt was seen UC in 09/2013 for poison ivy and received prednisone; did not completely clear up and now pt has an additional new outbreak of poison oak on his arm and leg. Pt request refill of prednisone, advised pt usually have to be seen to get prednisone rx. Pt scheduled appt today at 2 pm with Dr Damita Dunnings but if prednisone can be refilled to [facility] [facility] pt Prefers that if not pt will be at office at 2 pm.

## 2013-11-23 DIAGNOSIS — L255 Unspecified contact dermatitis due to plants, except food: Secondary | ICD-10-CM | POA: Insufficient documentation

## 2013-11-23 DIAGNOSIS — L237 Allergic contact dermatitis due to plants, except food: Secondary | ICD-10-CM | POA: Insufficient documentation

## 2013-11-23 NOTE — Assessment & Plan Note (Signed)
Restart pred taper with food, he can restart later on if he has another exposure that topical TAC doesn't control.  He agrees. F/u prn. Steroid cautions given.

## 2014-02-16 ENCOUNTER — Other Ambulatory Visit: Payer: Self-pay

## 2014-02-16 MED ORDER — DIVALPROEX SODIUM ER 500 MG PO TB24: ORAL_TABLET | ORAL | Status: DC

## 2014-02-18 ENCOUNTER — Encounter: Payer: Self-pay | Admitting: Neurology

## 2014-02-18 ENCOUNTER — Ambulatory Visit (INDEPENDENT_AMBULATORY_CARE_PROVIDER_SITE_OTHER): Payer: Medicare Other | Admitting: Neurology

## 2014-02-18 VITALS — BP 151/89 | HR 61 | Ht 71.5 in | Wt 230.0 lb

## 2014-02-18 DIAGNOSIS — G40309 Generalized idiopathic epilepsy and epileptic syndromes, not intractable, without status epilepticus: Secondary | ICD-10-CM

## 2014-02-18 MED ORDER — DIVALPROEX SODIUM ER 500 MG PO TB24: ORAL_TABLET | ORAL | Status: DC

## 2014-02-18 MED ORDER — RIZATRIPTAN BENZOATE 5 MG PO TBDP: 5.0000 mg | ORAL_TABLET | ORAL | Status: DC | PRN

## 2014-02-18 NOTE — Progress Notes (Signed)
Reason for visit followup for seizure disorder  HPI: Kevin Charles, 53 year old white male returns today for followup.  He has a history of seizure disorder, generalized tonic-clonic seizure, sometimes preceding by severe migraine headaches, started since 2000,  His mother also had epilepsy disorder.   His last seizure occurred in December of 2004. He is currently on Depakote tolerating the medication without side effects.   He lives by himself, on disability. He only has 1-2 major migraine each year, taking extra-strength Tylenol, lasting 3-4 hours.  He drives locally. He has never tried triptan treatment in the past for his migraine  ROS:  Negative except for blurred vision, tremor, shortness of breath   Medications Current Outpatient Prescriptions on File Prior to Visit  Medication Sig Dispense Refill  . Benzoyl Peroxide (DEL-AQUA) 10 % CREA Apply to face at night.       . divalproex (DEPAKOTE ER) 500 MG 24 hr tablet Take 2 tablets by mouth twice daily; except Sunday and Wednesday only take 1 in Am and 2 in Pm  110 tablet  0  . levothyroxine (SYNTHROID, LEVOTHROID) 150 MCG tablet Take 1 tablet (150 mcg total) by mouth daily. Except take 1.5 tabs on Sundays  35 tablet  11  . loratadine (CLARITIN) 10 MG tablet Take 10 mg by mouth daily as needed for allergies.      . metoprolol tartrate (LOPRESSOR) 25 MG tablet Take 1 tablet (25 mg total) by mouth daily.  90 tablet  3  . Multiple Vitamin (MULTIVITAMIN) tablet Take 1 tablet by mouth daily.        . pravastatin (PRAVACHOL) 40 MG tablet Take one tablet by mouth Monday, Wednesday and Friday.  45 tablet  3  . predniSONE (DELTASONE) 20 MG tablet 3 a day for 3 days, then 2 a day for 3 days, then 1 a day for 3 days, then 0.5 a day for 4 days.  By mouth with food.  20 tablet  1  . ranitidine (ZANTAC) 150 MG tablet Take 150 mg by mouth daily as needed for heartburn.      . SAW PALMETTO, SERENOA REPENS, PO Take 1 or 2 tablets by mouth daily.      Marland Kitchen  triamcinolone cream (KENALOG) 0.5 % Apply 1 application topically 3 (three) times daily.       No current facility-administered medications on file prior to visit.    Allergies  Allergies  Allergen Reactions  . Pravastatin Sodium     REACTION: tolerates 40mg  MWF but aches at higher dose or more frequent dosing    Physical Exam General: well developed, well nourished, seated, in no evident distress Head: head normocephalic and atraumatic. Oropharynx benign Neck: supple with no carotid  bruits Cardiovascular: regular rate and rhythm, no murmurs  Neurologic Exam Mental Status: Awake and fully alert. Oriented to place and time. Follows all commands. Speech and language normal.   Cranial Nerves: Fundoscopic exam reveals sharp disc margins. Pupils equal, briskly reactive to light. Extraocular movements full without nystagmus. Visual fields full to confrontation. Hearing intact and symmetric to finger snap. Facial sensation intact. Face, tongue, palate move normally and symmetrically. Neck flexion and extension normal.  Motor: Normal bulk and tone. Normal strength in all tested extremity muscles.No focal weakness Coordination: Rapid alternating movements normal in all extremities. Finger-to-nose and heel-to-shin performed accurately bilaterally. No dysmetria Gait and Station: Arises from chair without difficulty. Stance is normal. Gait demonstrates normal stride length and balance . Able to heel, toe and  tandem walk without difficulty.  Reflexes: 2+ and symmetric. Toes downgoing.   ASSESSMENT: Generalized seizure disorder with last seizure occurring in 2004, currently on Depakote without side effects   Refill his Depakote ER 500 mg 2 tablets twice a day, except Sunday and Wednesday, one in the a.m., 2 at p.m.  He is to continue to refill his medication for primary care physician Dr. Damita Dunnings, regular laboratory evaluation for his yearly checkup  Imitrex as needed  Only return to clinic for  new issues

## 2014-02-20 ENCOUNTER — Telehealth: Payer: Self-pay | Admitting: Family Medicine

## 2014-02-20 NOTE — Telephone Encounter (Signed)
Pt dropped off DMV for for his epilepsy. Put inbox/as

## 2014-02-23 NOTE — Telephone Encounter (Signed)
Done, scan and send, okay to drive.  Thanks.

## 2014-02-24 NOTE — Telephone Encounter (Signed)
Patient notified by telephone that completed paperwork faxed to [facility]. Sent to be scanned into EMR.

## 2014-04-17 LAB — URINALYSIS, COMPLETE
Bilirubin,UR: NEGATIVE
Blood: NEGATIVE
Glucose,UR: NEGATIVE mg/dL (ref 0–75)
LEUKOCYTE ESTERASE: NEGATIVE
Nitrite: NEGATIVE
PH: 7 (ref 4.5–8.0)
Protein: NEGATIVE
RBC,UR: <1 /HPF (ref 0–5)
SPECIFIC GRAVITY: 1.023 (ref 1.003–1.030)
WBC UR: <1 /HPF (ref 0–5)

## 2014-04-17 LAB — BASIC METABOLIC PANEL
ANION GAP: 8 (ref 7–16)
BUN: 15 mg/dL (ref 7–18)
CHLORIDE: 109 mmol/L — AB (ref 98–107)
Calcium, Total: 8.2 mg/dL — ABNORMAL LOW (ref 8.5–10.1)
Co2: 26 mmol/L (ref 21–32)
Creatinine: 0.5 mg/dL — ABNORMAL LOW (ref 0.60–1.30)
Glucose: 153 mg/dL — ABNORMAL HIGH (ref 65–99)
POTASSIUM: 4.5 mmol/L (ref 3.5–5.1)
SODIUM: 143 mmol/L (ref 136–145)

## 2014-04-17 LAB — CBC
HCT: 46.8 % (ref 40.0–52.0)
HGB: 16.4 g/dL (ref 13.0–18.0)
MCH: 33.4 pg (ref 26.0–34.0)
MCHC: 35.1 g/dL (ref 32.0–36.0)
MCV: 95 fL (ref 80–100)
Platelet: 179 10*3/uL (ref 150–440)
RBC: 4.92 10*6/uL (ref 4.40–5.90)
RDW: 12.6 % (ref 11.5–14.5)
WBC: 5.3 10*3/uL (ref 3.8–10.6)

## 2014-04-17 LAB — TROPONIN I: Troponin-I: 0.07 ng/mL — ABNORMAL HIGH

## 2014-04-18 DIAGNOSIS — R079 Chest pain, unspecified: Secondary | ICD-10-CM

## 2014-04-18 DIAGNOSIS — I209 Angina pectoris, unspecified: Secondary | ICD-10-CM

## 2014-04-18 DIAGNOSIS — E785 Hyperlipidemia, unspecified: Secondary | ICD-10-CM

## 2014-04-18 LAB — COMPREHENSIVE METABOLIC PANEL
ALT: 58 U/L
ANION GAP: 9 (ref 7–16)
AST: 48 U/L — AB (ref 15–37)
Albumin: 3.7 g/dL (ref 3.4–5.0)
Alkaline Phosphatase: 79 U/L
BUN: 13 mg/dL (ref 7–18)
Bilirubin,Total: 0.5 mg/dL (ref 0.2–1.0)
CHLORIDE: 111 mmol/L — AB (ref 98–107)
CO2: 23 mmol/L (ref 21–32)
CREATININE: 0.78 mg/dL (ref 0.60–1.30)
Calcium, Total: 8.6 mg/dL (ref 8.5–10.1)
EGFR (African American): >60
GLUCOSE: 99 mg/dL (ref 65–99)
OSMOLALITY: 285 (ref 275–301)
Potassium: 4.6 mmol/L (ref 3.5–5.1)
Sodium: 143 mmol/L (ref 136–145)
Total Protein: 7.2 g/dL (ref 6.4–8.2)

## 2014-04-18 LAB — LIPID PANEL
CHOLESTEROL: 200 mg/dL (ref 0–200)
HDL: 28 mg/dL — AB (ref 40–60)
LDL CHOLESTEROL, CALC: 129 mg/dL — AB (ref 0–100)
TRIGLYCERIDES: 217 mg/dL — AB (ref 0–200)
VLDL Cholesterol, Calc: 43 mg/dL — ABNORMAL HIGH (ref 5–40)

## 2014-04-18 LAB — APTT: ACTIVATED PTT: 26.9 s (ref 23.6–35.9)

## 2014-04-18 LAB — PROTIME-INR
INR: 0.9
Prothrombin Time: 11.7 secs (ref 11.5–14.7)

## 2014-04-18 LAB — HEPARIN LEVEL (UNFRACTIONATED)
ANTI-XA(UNFRACTIONATED): 0.11 [IU]/mL — AB (ref 0.30–0.70)
Anti-Xa(Unfractionated): 0.41 IU/mL (ref 0.30–0.70)

## 2014-04-18 LAB — TSH: Thyroid Stimulating Horm: 5.96 u[IU]/mL — ABNORMAL HIGH

## 2014-04-18 LAB — TROPONIN I
TROPONIN-I: 0.06 ng/mL — AB
Troponin-I: 0.06 ng/mL — ABNORMAL HIGH

## 2014-04-19 ENCOUNTER — Inpatient Hospital Stay: Payer: Self-pay | Admitting: Internal Medicine

## 2014-04-19 ENCOUNTER — Other Ambulatory Visit: Payer: Self-pay | Admitting: Family Medicine

## 2014-04-19 DIAGNOSIS — I10 Essential (primary) hypertension: Secondary | ICD-10-CM

## 2014-04-19 DIAGNOSIS — Z125 Encounter for screening for malignant neoplasm of prostate: Secondary | ICD-10-CM

## 2014-04-19 DIAGNOSIS — E039 Hypothyroidism, unspecified: Secondary | ICD-10-CM

## 2014-04-19 LAB — CBC WITH DIFFERENTIAL/PLATELET
BASOS PCT: 1.8 %
Basophil #: 0.1 10*3/uL (ref 0.0–0.1)
EOS ABS: 0.2 10*3/uL (ref 0.0–0.7)
EOS PCT: 4.4 %
HCT: 47.4 % (ref 40.0–52.0)
HGB: 15.9 g/dL (ref 13.0–18.0)
LYMPHS ABS: 1.9 10*3/uL (ref 1.0–3.6)
Lymphocyte %: 39.9 %
MCH: 32.2 pg (ref 26.0–34.0)
MCHC: 33.5 g/dL (ref 32.0–36.0)
MCV: 96 fL (ref 80–100)
Monocyte #: 0.6 x10 3/mm (ref 0.2–1.0)
Monocyte %: 12.9 %
NEUTROS ABS: 2 10*3/uL (ref 1.4–6.5)
NEUTROS PCT: 41 %
PLATELETS: 159 10*3/uL (ref 150–440)
RBC: 4.93 10*6/uL (ref 4.40–5.90)
RDW: 12.6 % (ref 11.5–14.5)
WBC: 4.8 10*3/uL (ref 3.8–10.6)

## 2014-04-19 LAB — HEPARIN LEVEL (UNFRACTIONATED)
ANTI-XA(UNFRACTIONATED): 0.53 [IU]/mL (ref 0.30–0.70)
Anti-Xa(Unfractionated): 0.22 IU/mL — ABNORMAL LOW (ref 0.30–0.70)

## 2014-04-20 DIAGNOSIS — I2510 Atherosclerotic heart disease of native coronary artery without angina pectoris: Secondary | ICD-10-CM

## 2014-04-20 HISTORY — PX: CARDIAC CATHETERIZATION: SHX172

## 2014-04-20 LAB — CBC WITH DIFFERENTIAL/PLATELET
BASOS ABS: 0.1 10*3/uL (ref 0.0–0.1)
Basophil %: 2 %
EOS PCT: 4.6 %
Eosinophil #: 0.2 10*3/uL (ref 0.0–0.7)
HCT: 46.3 % (ref 40.0–52.0)
HGB: 15.9 g/dL (ref 13.0–18.0)
LYMPHS ABS: 2 10*3/uL (ref 1.0–3.6)
Lymphocyte %: 38.8 %
MCH: 32.8 pg (ref 26.0–34.0)
MCHC: 34.3 g/dL (ref 32.0–36.0)
MCV: 96 fL (ref 80–100)
MONOS PCT: 14.8 %
Monocyte #: 0.8 x10 3/mm (ref 0.2–1.0)
NEUTROS PCT: 39.8 %
Neutrophil #: 2 10*3/uL (ref 1.4–6.5)
Platelet: 154 10*3/uL (ref 150–440)
RBC: 4.86 10*6/uL (ref 4.40–5.90)
RDW: 13 % (ref 11.5–14.5)
WBC: 5.2 10*3/uL (ref 3.8–10.6)

## 2014-04-20 LAB — HEPARIN LEVEL (UNFRACTIONATED)

## 2014-04-23 ENCOUNTER — Other Ambulatory Visit: Payer: Medicare Other

## 2014-04-24 ENCOUNTER — Other Ambulatory Visit: Payer: Self-pay | Admitting: *Deleted

## 2014-04-24 ENCOUNTER — Encounter: Payer: Self-pay | Admitting: Physician Assistant

## 2014-04-24 ENCOUNTER — Encounter: Payer: Self-pay | Admitting: Cardiovascular Disease

## 2014-04-24 MED ORDER — METOPROLOL TARTRATE 25 MG PO TABS: 25.0000 mg | ORAL_TABLET | Freq: Every day | ORAL | Status: DC

## 2014-04-29 ENCOUNTER — Encounter: Payer: Self-pay | Admitting: Physician Assistant

## 2014-04-29 ENCOUNTER — Ambulatory Visit: Payer: Medicare Other | Admitting: Family Medicine

## 2014-04-29 DIAGNOSIS — Z9289 Personal history of other medical treatment: Secondary | ICD-10-CM | POA: Insufficient documentation

## 2014-04-29 DIAGNOSIS — I2510 Atherosclerotic heart disease of native coronary artery without angina pectoris: Secondary | ICD-10-CM | POA: Insufficient documentation

## 2014-04-29 DIAGNOSIS — Z8249 Family history of ischemic heart disease and other diseases of the circulatory system: Secondary | ICD-10-CM | POA: Insufficient documentation

## 2014-04-30 ENCOUNTER — Ambulatory Visit (INDEPENDENT_AMBULATORY_CARE_PROVIDER_SITE_OTHER): Payer: Medicare Other | Admitting: Family Medicine

## 2014-04-30 ENCOUNTER — Encounter: Payer: Self-pay | Admitting: Family Medicine

## 2014-04-30 VITALS — BP 124/82 | HR 60 | Temp 98.7°F | Ht 70.75 in | Wt 232.5 lb

## 2014-04-30 DIAGNOSIS — E039 Hypothyroidism, unspecified: Secondary | ICD-10-CM

## 2014-04-30 DIAGNOSIS — E785 Hyperlipidemia, unspecified: Secondary | ICD-10-CM

## 2014-04-30 DIAGNOSIS — R51 Headache: Secondary | ICD-10-CM

## 2014-04-30 DIAGNOSIS — I10 Essential (primary) hypertension: Secondary | ICD-10-CM

## 2014-04-30 DIAGNOSIS — R519 Headache, unspecified: Secondary | ICD-10-CM

## 2014-04-30 DIAGNOSIS — I2510 Atherosclerotic heart disease of native coronary artery without angina pectoris: Secondary | ICD-10-CM

## 2014-04-30 DIAGNOSIS — G40209 Localization-related (focal) (partial) symptomatic epilepsy and epileptic syndromes with complex partial seizures, not intractable, without status epilepticus: Secondary | ICD-10-CM

## 2014-04-30 DIAGNOSIS — Z7189 Other specified counseling: Secondary | ICD-10-CM

## 2014-04-30 DIAGNOSIS — K219 Gastro-esophageal reflux disease without esophagitis: Secondary | ICD-10-CM

## 2014-04-30 DIAGNOSIS — G40909 Epilepsy, unspecified, not intractable, without status epilepticus: Secondary | ICD-10-CM

## 2014-04-30 DIAGNOSIS — Z125 Encounter for screening for malignant neoplasm of prostate: Secondary | ICD-10-CM

## 2014-04-30 DIAGNOSIS — Z0000 Encounter for general adult medical examination without abnormal findings: Secondary | ICD-10-CM

## 2014-04-30 LAB — PSA: PSA: 1.83 ng/mL (ref 0.10–4.00)

## 2014-04-30 MED ORDER — RANITIDINE HCL 150 MG PO TABS: 150.0000 mg | ORAL_TABLET | Freq: Two times a day (BID) | ORAL | Status: DC | PRN

## 2014-04-30 MED ORDER — PRAVASTATIN SODIUM 40 MG PO TABS: ORAL_TABLET | ORAL | Status: DC

## 2014-04-30 NOTE — Progress Notes (Signed)
Pre visit review using our clinic review tool, if applicable. No additional management support is needed unless otherwise documented below in the visit note.  I have personally reviewed the Medicare Annual Wellness questionnaire and have noted 1. The patient's medical and social history 2. Their use of alcohol, tobacco or illicit drugs 3. Their current medications and supplements 4. The patient's functional ability including ADL's, fall risks, home safety risks and hearing or visual             impairment. 5. Diet and physical activities 6. Evidence for depression or mood disorders  The patients weight, height, BMI have been recorded in the chart and visual acuity is per eye clinic.  I have made referrals, counseling and provided education to the patient based review of the above and I have provided the pt with a written personalized care plan for preventive services.  Provider list updated- see scanned forms.  Routine anticipatory guidance given to patient.  See health maintenance.  Flu 2015 at pharmacy Shingles due at 59 PNA due at 20 Tetanus 2013 Colonoscopy 2014 PSA pending.  Se notes on labs.   Advance directive d/w pt. Would have uncle Vikki Ports designated, then cousin Jobie Quaker.  Cognitive function addressed- see scanned forms- and if abnormal then additional documentation follows.   Recent admission with CP at [facility] with minimal changes on cath, no stent and no CABG needed.  No sx now but still with sig GERD sx, which may have been the causal issue with the admission. Taking H2 blocker QD.  D/w pt about diet and inc in H2 blocker use.  See plan.   H/o SX d/o, complaint with meds. No ADE.  Years w/o SZ.  Due for labs.  Doing well.   Hypothyroid. Minimal elevation in TSH during recent admission. No goiter. No neck pain.  D/w pt.  See plan.   Elevated Cholesterol: Using medications without problems:yes Muscle aches: no Diet compliance:yes Exercise:some Taking med MWF, w/o  ADE.   Hypertension:    Using medication without problems or lightheadedness: yes Chest pain with exertion:see above, no exertional sx now.  Edema: occ minmal sx Short of breath:minimal, he attributes to inc in weight and deconditioning.   HA with occ eye redness.  Can happen in either eye.  Had eye clinic eval, w/o sig abnormality seen per patient.  Brief stabbing sharp very painful episodes.  Can be photosensitive, irregular onset.  Can cluster together.  Some eye watering with the episodes.  No vision loss or changes.    PMH and SH reviewed  Meds, vitals, and allergies reviewed.   ROS: See HPI.  Otherwise negative.    GEN: nad, alert and oriented HEENT: mucous membranes moist, PERRL EOMI, minimal L and no R sided conjunctival injection w/o discharge.  NECK: supple w/o LA CV: rrr. PULM: ctab, no inc wob ABD: soft, +bs EXT: no edema SKIN: no acute rash CN 2-12 wnl B, S/S/DTR wnl x4

## 2014-04-30 NOTE — Patient Instructions (Signed)
Go to the lab on the way out.  We'll contact you with your lab report. Try taking the zantac twice a day as needed for now.  Let me check on your eye symptoms and we'll be in touch.  Take care.  Glad to see you.

## 2014-05-01 DIAGNOSIS — R51 Headache: Secondary | ICD-10-CM

## 2014-05-01 DIAGNOSIS — Z7189 Other specified counseling: Secondary | ICD-10-CM | POA: Insufficient documentation

## 2014-05-01 DIAGNOSIS — R519 Headache, unspecified: Secondary | ICD-10-CM | POA: Insufficient documentation

## 2014-05-01 LAB — VALPROIC ACID LEVEL: Valproic Acid Lvl: 113.3 ug/mL — ABNORMAL HIGH (ref 50.0–100.0)

## 2014-05-01 NOTE — Assessment & Plan Note (Signed)
Controlled, continue as is.  Diet and exercise d/w pt.

## 2014-05-01 NOTE — Assessment & Plan Note (Signed)
Cath reviewed, noncardiac source of sx likely. See GERD discussion.

## 2014-05-01 NOTE — Assessment & Plan Note (Signed)
Doing well, no ADE on meds.  Years w/o sx.  See notes on labs. No changes in meds.  Okay to continue f/u here, with only prn visits to neuro.

## 2014-05-01 NOTE — Assessment & Plan Note (Signed)
HA with occ eye redness.  Can happen in either eye.  Had eye clinic eval, w/o sig abnormality seen per patient.   Brief stabbing sharp very painful episodes.  Can be photosensitive, irregular onset.  Can cluster together.   Some eye watering with the episodes.  No vision loss or changes.   I want to consider his case and then we'll be in touch with patient.  He agrees.

## 2014-05-01 NOTE — Assessment & Plan Note (Addendum)
We didn't change anything at this point, see notes on eye sx.  No TMG on exam.

## 2014-05-01 NOTE — Assessment & Plan Note (Signed)
Inc zantac to 150mg  BID and report back as needed.

## 2014-05-01 NOTE — Assessment & Plan Note (Signed)
Flu 2015 at pharmacy Shingles due at 59 PNA due at 30 Tetanus 2013 Colonoscopy 2014 PSA pending.  Se notes on labs.   Advance directive d/w pt. Would have uncle Vikki Ports designated, then cousin Jobie Quaker.  Cognitive function addressed- see scanned forms- and if abnormal then additional documentation follows.

## 2014-05-01 NOTE — Assessment & Plan Note (Signed)
likely on max tolerated statin dose, continue as is. No sig CAD on cath.

## 2014-05-04 ENCOUNTER — Encounter: Payer: Self-pay | Admitting: *Deleted

## 2014-05-04 ENCOUNTER — Encounter: Payer: Medicare Other | Admitting: Physician Assistant

## 2014-05-04 NOTE — Progress Notes (Signed)
Patient was a no show.   This encounter was created in error - please disregard. 

## 2014-05-08 ENCOUNTER — Telehealth: Payer: Self-pay | Admitting: Family Medicine

## 2014-05-08 NOTE — Telephone Encounter (Signed)
Called patient, LMOVM at home and cell. No confidential info.  I told him I'd call him back later.   The issues are the headaches and eye sx.  I'm not sure he has cluster HA.  I wouldn't want to start CCB at this point.  May be reasonable to have him see neuro/HA clinic.  I'll offer referral.   It may be reasonable not to intervene if the headaches are mild/not persisting, not bothersome.  Will d/w pt.

## 2014-05-11 NOTE — Telephone Encounter (Signed)
Talked with patient.  Minimal sx in the meantime, he wanted to hold off on referral.  He'll notify me if the sx worsen and then we can refer if needed at that point.

## 2014-05-26 ENCOUNTER — Encounter: Payer: Self-pay | Admitting: Family Medicine

## 2014-05-26 LAB — TSH
Cholesterol, Total: 200
HDL: 28 mg/dL — AB (ref 35–70)
TRIGLYCERIDES: 217
TSH: 5.96
VLDL: 43 mg/dL

## 2014-07-07 ENCOUNTER — Ambulatory Visit (INDEPENDENT_AMBULATORY_CARE_PROVIDER_SITE_OTHER): Payer: Medicare Other | Admitting: Cardiovascular Disease

## 2014-07-07 ENCOUNTER — Encounter: Payer: Self-pay | Admitting: Cardiovascular Disease

## 2014-07-07 VITALS — BP 140/80 | HR 69 | Ht 70.0 in | Wt 235.0 lb

## 2014-07-07 DIAGNOSIS — I2510 Atherosclerotic heart disease of native coronary artery without angina pectoris: Secondary | ICD-10-CM | POA: Diagnosis not present

## 2014-07-07 DIAGNOSIS — R079 Chest pain, unspecified: Secondary | ICD-10-CM

## 2014-07-07 DIAGNOSIS — K219 Gastro-esophageal reflux disease without esophagitis: Secondary | ICD-10-CM | POA: Diagnosis not present

## 2014-07-07 DIAGNOSIS — E785 Hyperlipidemia, unspecified: Secondary | ICD-10-CM | POA: Diagnosis not present

## 2014-07-07 DIAGNOSIS — I10 Essential (primary) hypertension: Secondary | ICD-10-CM | POA: Diagnosis not present

## 2014-07-07 MED ORDER — PANTOPRAZOLE SODIUM 40 MG PO TBEC: 40.0000 mg | DELAYED_RELEASE_TABLET | Freq: Every day | ORAL | Status: DC

## 2014-07-07 NOTE — Patient Instructions (Signed)
Your physician has recommended you make the following change in your medication:  Start Protonix 40 mg once daily   Your physician wants you to follow-up in: 1 year with Dr. Fletcher Anon. You will receive a reminder letter in the mail two months in advance. If you don't receive a letter, please call our office to schedule the follow-up appointment.

## 2014-07-10 NOTE — Assessment & Plan Note (Signed)
His chest pain was likely due to GERD. He continues to have intermittent heartburn and thus I prescribed Protonix 40 mg once daily.

## 2014-07-10 NOTE — Assessment & Plan Note (Signed)
Blood pressure is controlled on current medications. 

## 2014-07-10 NOTE — Assessment & Plan Note (Signed)
Lab Results  Component Value Date   CHOL 253* 04/22/2013   HDL 28* 04/13/2014   LDLCALC 114* 04/15/2012   LDLDIRECT 183.0 04/22/2013   TRIG 217 04/13/2014   CHOLHDL 7 04/22/2013   Continue treatment with pravastatin. Recommend a target LDL is less than 100. Consider a more potent statin.

## 2014-07-10 NOTE — Progress Notes (Signed)
Primary care physician: Dr. Damita Dunnings  HPI  This is a 54 year old man who is here today for a follow-up visit regarding hospitalization in October. He presented with chest pain. He has known history of hypertension, seizure disorder, hyperlipidemia and hypothyroidism. He was found to have mildly elevated troponin. Cardiac catheterization was performed which showed mild nonobstructive coronary artery disease with worst stenosis of 40% involving the LAD. Ejection fraction was normal. He has been doing well since hospital discharge. He reports increased symptoms of heartburn.  Allergies  Allergen Reactions  . Pravastatin Sodium     REACTION: tolerates 40mg  MWF but aches at higher dose or more frequent dosing     Current Outpatient Prescriptions on File Prior to Visit  Medication Sig Dispense Refill  . aspirin 81 MG tablet Take 81 mg by mouth daily.    . Benzoyl Peroxide (DEL-AQUA) 10 % CREA Apply to face at night.     . divalproex (DEPAKOTE ER) 500 MG 24 hr tablet Take 2 tablets by mouth twice daily; except Sunday and Wednesday only take 1 in Am and 2 in Pm 120 tablet 11  . levothyroxine (SYNTHROID, LEVOTHROID) 150 MCG tablet Take 1 tablet (150 mcg total) by mouth daily. Except take 1.5 tabs on Sundays 35 tablet 11  . loratadine (CLARITIN) 10 MG tablet Take 10 mg by mouth daily as needed for allergies.    . metoprolol tartrate (LOPRESSOR) 25 MG tablet Take 1 tablet (25 mg total) by mouth daily. 90 tablet 1  . Multiple Vitamin (MULTIVITAMIN) tablet Take 1 tablet by mouth daily.      . pravastatin (PRAVACHOL) 40 MG tablet Take one tablet by mouth Monday, Wednesday and Friday. 45 tablet 3  . ranitidine (ZANTAC) 150 MG tablet Take 1 tablet (150 mg total) by mouth 2 (two) times daily as needed for heartburn.    . rizatriptan (MAXALT-MLT) 5 MG disintegrating tablet Take 1 tablet (5 mg total) by mouth as needed for migraine. May repeat in 2 hours if needed 4 tablet 6  . SAW PALMETTO, SERENOA REPENS, PO  Take 1 or 2 tablets by mouth daily.     No current facility-administered medications on file prior to visit.     Past Medical History  Diagnosis Date  . Hypothyroidism   . GERD (gastroesophageal reflux disease)   . Seizure disorder none since 2004    [facility]  . Hyperlipidemia   . BPH (benign prostatic hyperplasia)   . Hypertension   . Family history of premature CAD     a. father died of MI 70-49  . History of echocardiogram     a. 04/18/2014: EF 60-65%, borderline LVH, PA pressures not assessed  . CAD (coronary artery disease)     a. cath 04/20/2014: LM: nl, mLAD 40%, LCx minor luminal irregs, pRCA 10%     Past Surgical History  Procedure Laterality Date  . Appendectomy    . [place] Fractured madible and pelvis  . Eeg  09/1998    Normal  . Cardiac catheterization  04/20/2014     Family History  Problem Relation Age of Onset  . Stroke Mother     Mini strokes  . Hyperlipidemia Mother   . Hypertension Mother   . Heart disease Father 71    Heart Attack  . Heart attack Father   . Heart disease Maternal Aunt     Irregular heartbeat  . Heart disease Maternal Uncle   . Prostate cancer  Maternal Uncle   . Heart disease Maternal Grandfather   . Alcohol abuse Neg Hx   . Drug abuse Neg Hx   . Colon cancer Neg Hx   . Parkinsonism Other   . Hypertension Other   . Diabetes Other      History   Social History  . Marital Status: Single    Spouse Name: N/A    Number of Children: 0  . Years of Education: N/A   Occupational History  . 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 Smoking status: Never Smoker   . Smokeless tobacco: Former Systems developer    Types: Chew     Comment: quit 2009  . Alcohol Use: No  . Drug Use: No  . Sexual Activity: Not on file   Other Topics Concern  . Not on file   Social History Narrative   Divorced from his 2nd marriage in 2005.   No children.   Enjoys working at home, church.    Exercises at home.     PHYSICAL EXAM   BP 140/80 mmHg  Pulse 69  Ht 5\' 10"  (1.778 m)  Wt 235 lb (106.595 kg)  BMI 33.72 kg/m2 Constitutional: He is oriented to person, place, and time. He appears well-developed and well-nourished. No distress.  HENT: No nasal discharge.  Head: Normocephalic and atraumatic.  Eyes: Pupils are equal and round.  No discharge. Neck: Normal range of motion. Neck supple. No JVD present. No thyromegaly present.  Cardiovascular: Normal rate, regular rhythm, normal heart sounds. Exam reveals no gallop and no friction rub. No murmur heard.  Pulmonary/Chest: Effort normal and breath sounds normal. No stridor. No respiratory distress. He has no wheezes. He has no rales. He exhibits no tenderness.  Abdominal: Soft. Bowel sounds are normal. He exhibits no distension. There is no tenderness. There is no rebound and no guarding.  Musculoskeletal: Normal range of motion. He exhibits no edema and no tenderness.  Neurological: He is alert and oriented to person, place, and time. Coordination normal.  Skin: Skin is warm and dry. No rash noted. He is not diaphoretic. No erythema. No pallor.  Psychiatric: He has a normal mood and affect. His behavior is normal. Judgment and thought content normal.  Right radial pulse is normal with no hematoma     CYE:LYHTMB sinus rhythm Normal ECG   ASSESSMENT AND PLAN

## 2014-07-10 NOTE — Assessment & Plan Note (Signed)
Cardiac cath showed mild nonobstructive coronary artery disease for which I recommend aggressive treatment of risk factors. Ejection fraction was normal.

## 2014-07-14 ENCOUNTER — Telehealth: Payer: Self-pay

## 2014-07-14 NOTE — Telephone Encounter (Signed)
Pt wants to know if pt has ever had pneumonia vaccine. Could not find in chart; pt said he has never had pneumonia but was not sure if had the immunization. Pt said would talk with Dr Damita Dunnings at next appt.

## 2014-07-25 ENCOUNTER — Other Ambulatory Visit: Payer: Self-pay | Admitting: Family Medicine

## 2014-08-13 ENCOUNTER — Other Ambulatory Visit: Payer: Self-pay | Admitting: Family Medicine

## 2014-10-01 ENCOUNTER — Encounter: Payer: Self-pay | Admitting: Primary Care

## 2014-10-01 ENCOUNTER — Ambulatory Visit (INDEPENDENT_AMBULATORY_CARE_PROVIDER_SITE_OTHER): Payer: Medicare Other | Admitting: Primary Care

## 2014-10-01 VITALS — BP 140/78 | HR 73 | Temp 98.0°F | Ht 71.0 in | Wt 231.8 lb

## 2014-10-01 DIAGNOSIS — J019 Acute sinusitis, unspecified: Secondary | ICD-10-CM | POA: Diagnosis not present

## 2014-10-01 DIAGNOSIS — B9689 Other specified bacterial agents as the cause of diseases classified elsewhere: Secondary | ICD-10-CM

## 2014-10-01 MED ORDER — AMOXICILLIN-POT CLAVULANATE 875-125 MG PO TABS: 1.0000 | ORAL_TABLET | Freq: Two times a day (BID) | ORAL | Status: DC

## 2014-10-01 NOTE — Progress Notes (Signed)
Pre visit review using our clinic review tool, if applicable. No additional management support is needed unless otherwise documented below in the visit note. 

## 2014-10-01 NOTE — Patient Instructions (Signed)
Start Augmentin antibiotics today. Take one tablet by mouth twice daily for 10 days until gone. You may take your daily Claritin for allergy symptoms. Please let me know if you have no improvement in the next 4-5 days. Take care!  Sinusitis Sinusitis is redness, soreness, and inflammation of the paranasal sinuses. Paranasal sinuses are air pockets within the bones of your face (beneath the eyes, the middle of the forehead, or above the eyes). In healthy paranasal sinuses, mucus is able to drain out, and air is able to circulate through them by way of your nose. However, when your paranasal sinuses are inflamed, mucus and air can become trapped. This can allow bacteria and other germs to grow and cause infection. Sinusitis can develop quickly and last only a short time (acute) or continue over a long period (chronic). Sinusitis that lasts for more than 12 weeks is considered chronic.  CAUSES  Causes of sinusitis include:  Allergies.  Structural abnormalities, such as displacement of the cartilage that separates your nostrils (deviated septum), which can decrease the air flow through your nose and sinuses and affect sinus drainage.  Functional abnormalities, such as when the small hairs (cilia) that line your sinuses and help remove mucus do not work properly or are not present. SIGNS AND SYMPTOMS  Symptoms of acute and chronic sinusitis are the same. The primary symptoms are pain and pressure around the affected sinuses. Other symptoms include:  Upper toothache.  Earache.  Headache.  Bad breath.  Decreased sense of smell and taste.  A cough, which worsens when you are lying flat.  Fatigue.  Fever.  Thick drainage from your nose, which often is green and may contain pus (purulent).  Swelling and warmth over the affected sinuses. DIAGNOSIS  Your health care provider will perform a physical exam. During the exam, your health care provider may:  Look in your nose for signs of  abnormal growths in your nostrils (nasal polyps).  Tap over the affected sinus to check for signs of infection.  View the inside of your sinuses (endoscopy) using an imaging device that has a light attached (endoscope). If your health care provider suspects that you have chronic sinusitis, one or more of the following tests may be recommended:  Allergy tests.  Nasal culture. A sample of mucus is taken from your nose, sent to a lab, and screened for bacteria.  Nasal cytology. A sample of mucus is taken from your nose and examined by your health care provider to determine if your sinusitis is related to an allergy. TREATMENT  Most cases of acute sinusitis are related to a viral infection and will resolve on their own within 10 days. Sometimes medicines are prescribed to help relieve symptoms (pain medicine, decongestants, nasal steroid sprays, or saline sprays).  However, for sinusitis related to a bacterial infection, your health care provider will prescribe antibiotic medicines. These are medicines that will help kill the bacteria causing the infection.  Rarely, sinusitis is caused by a fungal infection. In theses cases, your health care provider will prescribe antifungal medicine. For some cases of chronic sinusitis, surgery is needed. Generally, these are cases in which sinusitis recurs more than 3 times per year, despite other treatments. HOME CARE INSTRUCTIONS   Drink plenty of water. Water helps thin the mucus so your sinuses can drain more easily.  Use a humidifier.  Inhale steam 3 to 4 times a day (for example, sit in the bathroom with the shower running).  Apply a warm, moist  washcloth to your face 3 to 4 times a day, or as directed by your health care provider.  Use saline nasal sprays to help moisten and clean your sinuses.  Take medicines only as directed by your health care provider.  If you were prescribed either an antibiotic or antifungal medicine, finish it all even if  you start to feel better. SEEK IMMEDIATE MEDICAL CARE IF:  You have increasing pain or severe headaches.  You have nausea, vomiting, or drowsiness.  You have swelling around your face.  You have vision problems.  You have a stiff neck.  You have difficulty breathing. MAKE SURE YOU:   Understand these instructions.  Will watch your condition.  Will get help right away if you are not doing well or get worse. Document Released: 06/05/2005 Document Revised: 10/20/2013 Document Reviewed: 06/20/2011 [facility] Patient Information 2015 [place], [place]. This information is not intended to replace advice given to you by your health care provider. Make sure you discuss any questions you have with your health care provider.
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
## 2014-10-10 NOTE — Discharge Summary (Signed)
PATIENT NAME:  Kevin Charles, ENDERLE MR#:  [id number] DATE OF BIRTH:  [birth year]  DATE OF ADMISSION:  04/19/2014 DATE OF DISCHARGE:  04/20/2014  PRESENTING COMPLAINT: Chest pain.   DISCHARGE DIAGNOSES: 1. Acute non-Q-wave myocardial infarction, mild, resolved.  2. Hypertension.  3. Mild coronary artery disease per cardiac catheterization.  4. Hyperlipidemia.  5. Cardiac catheterization showed mild coronary artery disease, 30% left anterior descending.   CONDITION ON DISCHARGE: Fair.   CODE STATUS: Full Code.   MEDICATIONS:  1. Valproic acid 500 mg b.i.d. on Monday, Tuesday, Thursday, Friday and Saturday.  Wednesday and Sunday 500 mg in the morning and 2 tablets in the evening. 2. Metoprolol 25 mg at bedtime. 3. Ranitidine 150 mg daily.  4. Pravastatin 40 mg on Monday, Wednesday and Friday at bedtime.  5. Synthroid 150 mcg p.o. daily.  6. Synthroid 225 mcg on Sundays.  7. Aspirin 81 mg daily.   DISCHARGE FOLLOWUP: 1. Follow up with Dr. Fletcher Anon in 1 to 2 weeks.  2. Follow up with primary care physician, Dr. Damita Dunnings, in 1 to 2 weeks.   BRIEF SUMMARY OF HOSPITAL COURSE: Mr. Wessler is a pleasant 54 year old, Caucasian gentleman, who came in with past medical history of hypertension, seizure disorder and hypothyroidism, came in with intermittent chest pain. He was admitted with:   1. Acute non-ST-MI. He was started on a heparin drip, along with aspirin, beta blockers, statins and nitroglycerin drip. Echocardiogram showed no wall motion abnormality. Cardiac enzymes were 0.06, 0.06, 0.06. Seen by Dr. Ellyn Hack and Dr. Fletcher Anon. Cardiac catheterization showed mild coronary artery disease.  2. Hypertension. Continue beta blockers.  3. Hyperlipidemia, on pravastatin.  4. Hypothyroidism, on Synthroid.  5. History of seizure disorder. Continued valproic acid per the patient's home dosing.  Hospital stay otherwise remained stable. The patient remained a Full Code.  TIME SPENT: 40  minutes.   ____________________________ Hart Rochester Posey Pronto, MD sap:JT D: 04/30/2014 07:30:49 ET T: 04/30/2014 10:08:17 ET JOB#: [id number]  cc: Achilles Neville A. Posey Pronto, MD, <Dictator> Kathlyn Sacramento, MD Elsie Stain, MD Ilda Basset MD ELECTRONICALLY SIGNED 05/07/2014 11:49

## 2014-10-22 ENCOUNTER — Other Ambulatory Visit: Payer: Self-pay

## 2014-10-22 MED ORDER — PANTOPRAZOLE SODIUM 40 MG PO TBEC: 40.0000 mg | DELAYED_RELEASE_TABLET | Freq: Every day | ORAL | Status: DC

## 2014-10-22 NOTE — Telephone Encounter (Signed)
Refill sent for pantoprazole 40 mg  

## 2014-11-09 ENCOUNTER — Ambulatory Visit (INDEPENDENT_AMBULATORY_CARE_PROVIDER_SITE_OTHER): Payer: Medicare Other | Admitting: Family Medicine

## 2014-11-09 ENCOUNTER — Encounter: Payer: Self-pay | Admitting: Family Medicine

## 2014-11-09 VITALS — BP 130/80 | HR 74 | Temp 98.8°F | Wt 238.8 lb

## 2014-11-09 DIAGNOSIS — I861 Scrotal varices: Secondary | ICD-10-CM | POA: Diagnosis not present

## 2014-11-09 DIAGNOSIS — K4090 Unilateral inguinal hernia, without obstruction or gangrene, not specified as recurrent: Secondary | ICD-10-CM | POA: Diagnosis not present

## 2014-11-09 NOTE — Assessment & Plan Note (Signed)
Refer to urology.  ?

## 2014-11-09 NOTE — Patient Instructions (Signed)
Kevin Charles will call about your referral. I'll ask them if they can see you about the varicocele or if you'll need urology input.   Take care. Limit lifting for now.

## 2014-11-09 NOTE — Assessment & Plan Note (Signed)
Refer to gen surgery.  D/w pt about anatomy.  If worsening suddenly, to ER.  Still okay for outpatient f/u.

## 2014-11-09 NOTE — Progress Notes (Signed)
Pre visit review using our clinic review tool, if applicable. No additional management support is needed unless otherwise documented below in the visit note.  Migraines are better overall, with help from maxalt when needed.   Had seen prev MD years ago, noted L sided varicocele.  Rarely was uncomfortable until the last few weeks.  Now with more discomfort.  He was helping move a fridge and noted more pain after that.    R sided inguinal hernia noted prev, but more sore recently.  He couldn't feel it until recently.  Now he can feel a lump.  "It got better today, it always gets better when I go to the doctor," but he was having sx daily for the last week or so.    Meds, vitals, and allergies reviewed.   ROS: See HPI.  Otherwise, noncontributory.  nad ncat rrr ctab R inguinal exam with soft but tender small hernia noted.  R testicle not ttp L testicle not ttp but varicocele noted on exam.

## 2014-11-25 ENCOUNTER — Telehealth: Payer: Self-pay | Admitting: Family Medicine

## 2014-11-25 DIAGNOSIS — K4090 Unilateral inguinal hernia, without obstruction or gangrene, not specified as recurrent: Secondary | ICD-10-CM | POA: Diagnosis not present

## 2014-11-25 NOTE — Telephone Encounter (Signed)
Noted, thanks!

## 2014-11-25 NOTE — Telephone Encounter (Signed)
FYI: Pt came in office today after he saw general surgeon and pt that everything looked okay. Hernia was so small it should not be messed with right now. They also talked about his varicocele and Dr. Nelva Bush stated everything looked fine. Per pt request I cancelled his urology appt scheduled 12/14/14 with Dr. Diona Fanti. Pt also said that [facility] should be faxing you his ov notes.

## 2014-12-15 ENCOUNTER — Telehealth: Payer: Self-pay

## 2014-12-15 ENCOUNTER — Other Ambulatory Visit: Payer: Self-pay | Admitting: *Deleted

## 2014-12-15 MED ORDER — METOPROLOL TARTRATE 25 MG PO TABS: 25.0000 mg | ORAL_TABLET | Freq: Every day | ORAL | Status: DC

## 2014-12-15 NOTE — Telephone Encounter (Signed)
Have him check with pharmacy and see what other triptans are cheaper for him.  Has he been on another triptan (like imitrex) in the past?  Thanks.

## 2014-12-15 NOTE — Telephone Encounter (Addendum)
Pt said Rizatriptan tier 3 is too expensive; last time pt pd $ 6.10. Now cost to pt is $ 37.00 for # 4. Pt request tier change; pt said Topiramate immediate release is tier 2 and pt thinks less expensive. Pt no longer sees Dr Krista Blue the neurologist and request Dr Damita Dunnings to change med. Pt request cb. [facility] [address]

## 2014-12-16 MED ORDER — RIZATRIPTAN BENZOATE 5 MG PO TBDP: 5.0000 mg | ORAL_TABLET | ORAL | Status: DC | PRN

## 2014-12-16 NOTE — Telephone Encounter (Signed)
Pt left v/m; pt spoke with ins co and ins co advised pt to get a list of comparable drugs from Dr Damita Dunnings; the other triptans are all in tier 3 or 4. Pt request cb.

## 2014-12-16 NOTE — Telephone Encounter (Signed)
I'm never going to know what is in his formulary. The insurance company at a minimum ought to be able to give him a list of options, tier 1-4.  I can look at that when he gets it.  He can try otc excedrin, that would be the likely cheapest option.  Let me know how he does.

## 2014-12-16 NOTE — Telephone Encounter (Signed)
Pt left v/m; pt checked with ins co and imitrex and frova are not in formulary and not covered by ins. Pt said he may try to pay cash price for Rizatriptan or try OTC with excedrin for migraines. Pt said if can think of any other drug to try pt will call ins co to ck tier level. Pt has h/a today. Pt request cb.

## 2014-12-16 NOTE — Telephone Encounter (Signed)
Patient notified as instructed by telephone and verbalized understanding. Patient stated that he will check on the medications and call back with the information.

## 2014-12-16 NOTE — Telephone Encounter (Signed)
Pt left v/m requesting cb about med change.

## 2014-12-16 NOTE — Telephone Encounter (Signed)
Patient notified as instructed by telephone and verbalized understanding. Patient requested that a script for Rizatriptan which he was on earlier be sent to [facility] and he is going to pay the cash price for this.

## 2014-12-16 NOTE — Telephone Encounter (Signed)
Patient notified by telephone and verbalized understanding. 

## 2014-12-16 NOTE — Telephone Encounter (Signed)
Pt left v/m;Metoprolol not at [facility] with sherry at [facility] and called in metoprolol rx as instructed; metoprolol was sent electronically to [facility] [address]. Spoke with Wells Guiles at [facility] and cancelled metoprolol rx. Pt will pick up med at [facility] later today.

## 2014-12-16 NOTE — Telephone Encounter (Signed)
I would have him price check imitrex and frova.  Both are triptans and either should would.  See if either is a tier 3.  Thanks.

## 2014-12-16 NOTE — Telephone Encounter (Signed)
Sent.  Thanks.  I hate that his has been a hassle (and more expensive) for him.

## 2014-12-16 NOTE — Telephone Encounter (Signed)
Pt called back and requested I call pharmacy; spoke to [place] at [address] and was advised pt will need to contact ins co. About tier level meds. Pt will contact ins co and cb with name of other triptans with lower tier level.

## 2015-01-01 LAB — FECAL OCCULT BLOOD, GUAIAC: FECAL OCCULT BLD: NEGATIVE

## 2015-01-21 ENCOUNTER — Encounter: Payer: Self-pay | Admitting: Family Medicine

## 2015-03-22 ENCOUNTER — Other Ambulatory Visit: Payer: Self-pay

## 2015-03-22 NOTE — Telephone Encounter (Signed)
Patient's called back and wanted to know if rx can be called in to pharmacy.  He's out doing errands and would like to go by and pick it up.

## 2015-03-22 NOTE — Telephone Encounter (Signed)
Pt left v/m requesting refill depakote to [facility]; Dr Krista Blue has previously refilled but said pt has previously discussed with Dr Damita Dunnings about filling the depakote. Pt is leaving for mission trip in few days and request refill done 03/22/15.pt request cb when refilled.Please advise. Last annual exam 04/30/14.

## 2015-03-23 MED ORDER — DIVALPROEX SODIUM ER 500 MG PO TB24: ORAL_TABLET | ORAL | Status: DC

## 2015-03-23 NOTE — Telephone Encounter (Signed)
Sent.  I didn't get to any refills yesterday during business hours due to patient care.

## 2015-04-26 ENCOUNTER — Other Ambulatory Visit: Payer: Self-pay | Admitting: *Deleted

## 2015-04-26 MED ORDER — PRAVASTATIN SODIUM 40 MG PO TABS: ORAL_TABLET | ORAL | Status: DC

## 2015-04-26 MED ORDER — LEVOTHYROXINE SODIUM 150 MCG PO TABS: ORAL_TABLET | ORAL | Status: DC

## 2015-04-26 MED ORDER — METOPROLOL TARTRATE 25 MG PO TABS: 25.0000 mg | ORAL_TABLET | Freq: Every day | ORAL | Status: DC

## 2015-04-26 NOTE — Telephone Encounter (Signed)
Faxed refill request. Last Filled:    120 tablet 5 03/23/2015  Patient has scheduled CPE and labs at the end of November.  Please advise.

## 2015-04-27 ENCOUNTER — Other Ambulatory Visit: Payer: Self-pay | Admitting: Cardiovascular Disease

## 2015-04-27 MED ORDER — PANTOPRAZOLE SODIUM 40 MG PO TBEC: 40.0000 mg | DELAYED_RELEASE_TABLET | Freq: Every day | ORAL | Status: DC

## 2015-04-27 MED ORDER — DIVALPROEX SODIUM ER 500 MG PO TB24: ORAL_TABLET | ORAL | Status: DC

## 2015-04-27 NOTE — Telephone Encounter (Signed)
Sent. Thanks.   

## 2015-05-02 ENCOUNTER — Other Ambulatory Visit: Payer: Self-pay | Admitting: Family Medicine

## 2015-05-02 DIAGNOSIS — E785 Hyperlipidemia, unspecified: Secondary | ICD-10-CM

## 2015-05-02 DIAGNOSIS — G40209 Localization-related (focal) (partial) symptomatic epilepsy and epileptic syndromes with complex partial seizures, not intractable, without status epilepticus: Secondary | ICD-10-CM

## 2015-05-02 DIAGNOSIS — E039 Hypothyroidism, unspecified: Secondary | ICD-10-CM

## 2015-05-02 DIAGNOSIS — Z125 Encounter for screening for malignant neoplasm of prostate: Secondary | ICD-10-CM

## 2015-05-04 ENCOUNTER — Encounter: Payer: Medicare Other | Admitting: Family Medicine

## 2015-05-04 ENCOUNTER — Other Ambulatory Visit (INDEPENDENT_AMBULATORY_CARE_PROVIDER_SITE_OTHER): Payer: Medicare Other

## 2015-05-04 DIAGNOSIS — E785 Hyperlipidemia, unspecified: Secondary | ICD-10-CM | POA: Diagnosis not present

## 2015-05-04 DIAGNOSIS — Z125 Encounter for screening for malignant neoplasm of prostate: Secondary | ICD-10-CM | POA: Diagnosis not present

## 2015-05-04 DIAGNOSIS — E039 Hypothyroidism, unspecified: Secondary | ICD-10-CM | POA: Diagnosis not present

## 2015-05-04 DIAGNOSIS — G40209 Localization-related (focal) (partial) symptomatic epilepsy and epileptic syndromes with complex partial seizures, not intractable, without status epilepticus: Secondary | ICD-10-CM | POA: Diagnosis not present

## 2015-05-04 LAB — COMPREHENSIVE METABOLIC PANEL
ALK PHOS: 74 U/L (ref 39–117)
ALT: 37 U/L (ref 0–53)
AST: 33 U/L (ref 0–37)
Albumin: 4.3 g/dL (ref 3.5–5.2)
BILIRUBIN TOTAL: 1 mg/dL (ref 0.2–1.2)
BUN: 16 mg/dL (ref 6–23)
CO2: 29 mEq/L (ref 19–32)
Calcium: 10 mg/dL (ref 8.4–10.5)
Chloride: 105 mEq/L (ref 96–112)
Creatinine, Ser: 0.88 mg/dL (ref 0.40–1.50)
GFR: 95.6 mL/min (ref >60.00)
GLUCOSE: 84 mg/dL (ref 70–99)
Potassium: 5.2 mEq/L — ABNORMAL HIGH (ref 3.5–5.1)
SODIUM: 140 meq/L (ref 135–145)
TOTAL PROTEIN: 6.9 g/dL (ref 6.0–8.3)

## 2015-05-04 LAB — PSA: PSA: 1.96 ng/mL (ref 0.10–4.00)

## 2015-05-04 LAB — LIPID PANEL
Cholesterol: 204 mg/dL — ABNORMAL HIGH (ref 0–200)
HDL: 31.9 mg/dL — ABNORMAL LOW (ref >39.00)
NonHDL: 172.56
Total CHOL/HDL Ratio: 6
Triglycerides: 218 mg/dL — ABNORMAL HIGH (ref 0.0–149.0)
VLDL: 43.6 mg/dL — AB (ref 0.0–40.0)

## 2015-05-04 LAB — LDL CHOLESTEROL, DIRECT: Direct LDL: 150 mg/dL

## 2015-05-04 LAB — TSH: TSH: 2.19 u[IU]/mL (ref 0.35–4.50)

## 2015-05-05 LAB — VALPROIC ACID LEVEL: VALPROIC ACID LVL: 137.7 ug/mL — AB (ref 50.0–100.0)

## 2015-05-11 ENCOUNTER — Encounter: Payer: Self-pay | Admitting: Family Medicine

## 2015-05-11 ENCOUNTER — Ambulatory Visit (INDEPENDENT_AMBULATORY_CARE_PROVIDER_SITE_OTHER): Payer: Medicare Other | Admitting: Family Medicine

## 2015-05-11 VITALS — BP 122/84 | HR 64 | Temp 97.9°F | Ht 71.0 in | Wt 231.2 lb

## 2015-05-11 DIAGNOSIS — G40909 Epilepsy, unspecified, not intractable, without status epilepticus: Secondary | ICD-10-CM | POA: Diagnosis not present

## 2015-05-11 DIAGNOSIS — Z7189 Other specified counseling: Secondary | ICD-10-CM

## 2015-05-11 DIAGNOSIS — Z0000 Encounter for general adult medical examination without abnormal findings: Secondary | ICD-10-CM | POA: Diagnosis not present

## 2015-05-11 DIAGNOSIS — E785 Hyperlipidemia, unspecified: Secondary | ICD-10-CM | POA: Diagnosis not present

## 2015-05-11 DIAGNOSIS — E039 Hypothyroidism, unspecified: Secondary | ICD-10-CM

## 2015-05-11 DIAGNOSIS — Z119 Encounter for screening for infectious and parasitic diseases, unspecified: Secondary | ICD-10-CM

## 2015-05-11 DIAGNOSIS — I10 Essential (primary) hypertension: Secondary | ICD-10-CM

## 2015-05-11 NOTE — Patient Instructions (Addendum)
Update your contact information about Kevin Charles, with a phone number.  Recheck depakote level next week.  Don't take your medicine just before the lab draw.  Take it after the draw.   Take care.  Glad to see you.

## 2015-05-11 NOTE — Progress Notes (Signed)
Pre visit review using our clinic review tool, if applicable. No additional management support is needed unless otherwise documented below in the visit note.  I have personally reviewed the Medicare Annual Wellness questionnaire and have noted 1. The patient's medical and social history 2. Their use of alcohol, tobacco or illicit drugs 3. Their current medications and supplements 4. The patient's functional ability including ADL's, fall risks, home safety risks and hearing or visual             impairment. 5. Diet and physical activities 6. Evidence for depression or mood disorders  The patients weight, height, BMI have been recorded in the chart and visual acuity is per eye clinic.  I have made referrals, counseling and provided education to the patient based review of the above and I have provided the pt with a written personalized care plan for preventive services.  Provider list updated- see scanned forms.  Routine anticipatory guidance given to patient.  See health maintenance.  Flu done 2016 Shingles not due yet PNA not due yet Tetanus 2013 Colonoscopy 2014 Prostate cancer screening- PSA wnl 2016 Advance directive- Kevin Charles designated if patient were incapacitated.   Cognitive function addressed- see scanned forms- and if abnormal then additional documentation follows.  Pt opts in for HCV and HI screening.  D/w pt re: routine screening.    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  Compliant with meds.  No ADE on meds.  depakote level up, unclear if timing of last dose before labs had affected his level.  See plans for repeat level.   Hypothyroid.  Compliant.  No neck mass, no dysphagia.  TSH wnl.  D/w pt.  Hypertension:    Using medication without problems or lightheadedness: yes Chest pain with exertion:no Edema:no Short of breath:no  Elevated Cholesterol: Using medications without problems:yes Muscle aches: not on current dose Diet compliance: yes Exercise: yes  PMH and SH  reviewed  Meds, vitals, and allergies reviewed.   ROS: See HPI.  Otherwise negative.    GEN: nad, alert and oriented HEENT: mucous membranes moist NECK: supple w/o LA, no tmg CV: rrr. PULM: ctab, no inc wob ABD: soft, +bs EXT: no edema SKIN: no acute rash

## 2015-05-12 NOTE — Assessment & Plan Note (Signed)
No events, continue med as is.  Repeat VPA pending.  If not lower, we may need to address his dose.  Unclear if timing of last dose before level affected his results.

## 2015-05-12 NOTE — Assessment & Plan Note (Signed)
tsh wnl, continue as is.  D/w pt.  

## 2015-05-12 NOTE — Assessment & Plan Note (Signed)
Continue work on diet and exercise. LDL and TG not to goal but on max dose of statin.  D/w pt.  No change in med.

## 2015-05-12 NOTE — Assessment & Plan Note (Signed)
Controlled, continue as is.  Labs d/w pt. Continue diet and exercise.  

## 2015-05-12 NOTE — Assessment & Plan Note (Signed)
Flu done 2016 Shingles not due yet PNA not due yet Tetanus 2013 Colonoscopy 2014 Prostate cancer screening- PSA wnl 2016 Advance directive- Kevin Charles designated if patient were incapacitated.   Cognitive function addressed- see scanned forms- and if abnormal then additional documentation follows.  Pt opts in for HCV and HI screening.  D/w pt re: routine screening.

## 2015-05-18 ENCOUNTER — Other Ambulatory Visit (INDEPENDENT_AMBULATORY_CARE_PROVIDER_SITE_OTHER): Payer: Medicare Other

## 2015-05-18 DIAGNOSIS — G40909 Epilepsy, unspecified, not intractable, without status epilepticus: Secondary | ICD-10-CM | POA: Diagnosis not present

## 2015-05-18 DIAGNOSIS — Z119 Encounter for screening for infectious and parasitic diseases, unspecified: Secondary | ICD-10-CM

## 2015-05-19 LAB — HIV ANTIBODY (ROUTINE TESTING W REFLEX): HIV 1&2 Ab, 4th Generation: NONREACTIVE

## 2015-05-19 LAB — VALPROIC ACID LEVEL: VALPROIC ACID LVL: 108.7 ug/mL — AB (ref 50.0–100.0)

## 2015-05-19 LAB — HEPATITIS C ANTIBODY: HCV AB: NEGATIVE

## 2015-07-01 ENCOUNTER — Encounter: Payer: Self-pay | Admitting: Cardiovascular Disease

## 2015-07-01 ENCOUNTER — Ambulatory Visit (INDEPENDENT_AMBULATORY_CARE_PROVIDER_SITE_OTHER): Payer: Medicare Other | Admitting: Cardiovascular Disease

## 2015-07-01 VITALS — BP 136/92 | HR 56 | Ht 71.0 in | Wt 230.5 lb

## 2015-07-01 DIAGNOSIS — E785 Hyperlipidemia, unspecified: Secondary | ICD-10-CM | POA: Diagnosis not present

## 2015-07-01 DIAGNOSIS — I10 Essential (primary) hypertension: Secondary | ICD-10-CM | POA: Diagnosis not present

## 2015-07-01 DIAGNOSIS — I2510 Atherosclerotic heart disease of native coronary artery without angina pectoris: Secondary | ICD-10-CM | POA: Diagnosis not present

## 2015-07-01 MED ORDER — ROSUVASTATIN CALCIUM 10 MG PO TABS: 10.0000 mg | ORAL_TABLET | Freq: Every day | ORAL | Status: DC

## 2015-07-01 NOTE — Assessment & Plan Note (Signed)
The patient has known history of  Mild nonobstructive coronary artery disease for which I recommend continued treatment of risk factors. He is currently not having anginal symptoms. I again discussed with him the importance of healthy diet and weight loss.

## 2015-07-01 NOTE — Assessment & Plan Note (Signed)
Lab Results  Component Value Date   CHOL 204* 05/04/2015   HDL 31.90* 05/04/2015   LDLCALC 129* 04/18/2014   LDLDIRECT 150.0 05/04/2015   TRIG 218.0* 05/04/2015   CHOLHDL 6 05/04/2015    His lipid profile was not optimal at all. He has not tried any other statins other than pravastatin which he takes every other day. Thus, I switched him to rosuvastatin 10 mg once daily. I recommend a target LDL of less than 70 given his mild nonobstructive coronary artery disease. I requested testing lipid and liver profile to be done in one month.

## 2015-07-01 NOTE — Patient Instructions (Signed)
Medication Instructions:  Your physician has recommended you make the following change in your medication:  STOP taking pravastatin START taking crestor 10mg  daily   Labwork: Fasting lipid and liver profile in one month  Testing/Procedures: none  Follow-Up: Your physician wants you to follow-up in: one year with Dr. Fletcher Anon.  You will receive a reminder letter in the mail two months in advance. If you don't receive a letter, please call our office to schedule the follow-up appointment.   Any Other Special Instructions Will Be Listed Below (If Applicable).     If you need a refill on your cardiac medications before your next appointment, please call your pharmacy.

## 2015-07-01 NOTE — Progress Notes (Signed)
Primary care physician: Dr. Damita Dunnings  HPI  This is a 55 year old man who is here today for a follow-up visit regarding mild nonobstructive coronary artery disease with multiple risk factors.  he had a small non-ST elevation myocardial infarction in November 2015 cardiac catheterization showed only mild nonobstructive disease with 40% stenosis affecting the LAD.   He has known history of hypertension, seizure disorder, hyperlipidemia and hypothyroidism.  he was started on Protonix last year with significant improvement in GERD symptoms.  He is overall doing well with no recurrent chest pain or shortness of breath. He is trying to lose weight. He reports myalgia on higher dose of pravastatin but has not tried any other statin.  Allergies  Allergen Reactions  . Pravastatin Sodium     REACTION: tolerates 40mg  MWF but aches at higher dose or more frequent dosing     Current Outpatient Prescriptions on File Prior to Visit  Medication Sig Dispense Refill  . aspirin 81 MG tablet Take 81 mg by mouth daily.    . Benzoyl Peroxide (DEL-AQUA) 10 % CREA Apply to face at night.     . divalproex (DEPAKOTE ER) 500 MG 24 hr tablet Take 2 tablets by mouth twice daily; except Sunday and Wednesday only take 1 in Am and 2 in Pm 120 tablet 5  . levothyroxine (SYNTHROID, LEVOTHROID) 150 MCG tablet TAKE 1 TABLET BY MOUTH ONCE DAILY EXCEPT 1 & 1/2 TABLETS ON SUNDAY 35 tablet 11  . loratadine (CLARITIN) 10 MG tablet Take 10 mg by mouth daily as needed for allergies.    . metoprolol tartrate (LOPRESSOR) 25 MG tablet Take 1 tablet (25 mg total) by mouth daily. 90 tablet 1  . Multiple Vitamin (MULTIVITAMIN) tablet Take 1 tablet by mouth daily.      . pantoprazole (PROTONIX) 40 MG tablet Take 1 tablet (40 mg total) by mouth daily. 90 tablet 0  . pravastatin (PRAVACHOL) 40 MG tablet Take one tablet by mouth Monday, Wednesday and Friday. 45 tablet 3  . rizatriptan (MAXALT-MLT) 5 MG disintegrating tablet Take 1 tablet (5  mg total) by mouth as needed for migraine. May repeat in 2 hours if needed 4 tablet 5  . SAW PALMETTO, SERENOA REPENS, PO Take 1 or 2 tablets by mouth daily.     No current facility-administered medications on file prior to visit.     Past Medical History  Diagnosis Date  . Hypothyroidism   . GERD (gastroesophageal reflux disease)   . Seizure disorder ([place]) none since 2004    [facility]  . Hyperlipidemia   . BPH (benign prostatic hyperplasia)   . Hypertension   . Family history of premature CAD     a. father died of MI 92-49  . History of echocardiogram     a. 04/18/2014: EF 60-65%, borderline LVH, PA pressures not assessed  . CAD (coronary artery disease)     a. cath 04/20/2014: LM: nl, mLAD 40%, LCx minor luminal irregs, pRCA 10%  . Migraines      Past Surgical History  Procedure Laterality Date  . Appendectomy    . Section Fractured madible and pelvis  . Eeg  09/1998    Normal  . Cardiac catheterization  04/20/2014     Family History  Problem Relation Age of Onset  . Stroke Mother     Mini strokes  . Hyperlipidemia Mother   . Hypertension Mother   . Heart disease Father 51  Heart Attack  . Heart attack Father   . Heart disease Maternal Aunt     Irregular heartbeat  . Heart disease Maternal Uncle   . Prostate cancer Maternal Uncle   . Heart disease Maternal Grandfather   . Alcohol abuse Neg Hx   . Drug abuse Neg Hx   . Colon cancer Neg Hx   . Parkinsonism Other   . Hypertension Other   . Diabetes Other      Social History   Social History  . Marital Status: Single    Spouse Name: N/A  . Number of Children: 0  . Years of Education: N/A   Occupational History  . 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 Smoking status: Former Research scientist (life sciences)  . Smokeless tobacco: Former Systems developer    Types: Chew     Comment: quit 2009, quit smoking 1995  . Alcohol Use: No  . Drug Use: No  . Sexual Activity: Not  on file   Other Topics Concern  . Not on file   Social History Narrative   Divorced from his 2nd marriage in 2005.   No children.   Enjoys working at home, church.   Exercises at home.     PHYSICAL EXAM   BP 136/92 mmHg  Pulse 56  Ht 5\' 11"  (075-GRM m)  Wt 230 lb 8 oz (104.554 kg)  BMI 32.16 kg/m2 Constitutional: He is oriented to person, place, and time. He appears well-developed and well-nourished. No distress.  HENT: No nasal discharge.  Head: Normocephalic and atraumatic.  Eyes: Pupils are equal and round.  No discharge. Neck: Normal range of motion. Neck supple. No JVD present. No thyromegaly present.  Cardiovascular: Normal rate, regular rhythm, normal heart sounds. Exam reveals no gallop and no friction rub. No murmur heard.  Pulmonary/Chest: Effort normal and breath sounds normal. No stridor. No respiratory distress. He has no wheezes. He has no rales. He exhibits no tenderness.  Abdominal: Soft. Bowel sounds are normal. He exhibits no distension. There is no tenderness. There is no rebound and no guarding.  Musculoskeletal: Normal range of motion. He exhibits no edema and no tenderness.  Neurological: He is alert and oriented to person, place, and time. Coordination normal.  Skin: Skin is warm and dry. No rash noted. He is not diaphoretic. No erythema. No pallor.  Psychiatric: He has a normal mood and affect. His behavior is normal. Judgment and thought content normal.  Right radial pulse is normal with no hematoma     EKG: sinus bradycardia with no significant ST CHANGES.  ASSESSMENT AND PLAN

## 2015-07-01 NOTE — Assessment & Plan Note (Signed)
Blood pressure is reasonably controlled but his diastolic pressure is slightly elevated. We can consider switching to carvedilol in the future if needed.

## 2015-07-13 ENCOUNTER — Telehealth: Payer: Self-pay

## 2015-07-13 NOTE — Telephone Encounter (Signed)
Pt calling regarding a letter he received a letter from [place] regarding his medications. States he needs to contact our office. Please call.

## 2015-07-14 NOTE — Telephone Encounter (Signed)
LMOM to have patient call back regarding the question on [facility].

## 2015-07-15 NOTE — Telephone Encounter (Signed)
The patient has a letter that stated he needs to have crestor sent as a 90 day supply through [facility] but the patient is due to have a liver/lipid check on Feb. 12, 2017 and he will wait until after his blood work to have the crestor sent to [facility] for a 90 day supply. The patient states, "I have plenty of crestor."

## 2015-07-30 ENCOUNTER — Other Ambulatory Visit: Payer: Self-pay | Admitting: *Deleted

## 2015-07-30 MED ORDER — ROSUVASTATIN CALCIUM 10 MG PO TABS: 10.0000 mg | ORAL_TABLET | Freq: Every day | ORAL | Status: DC

## 2015-08-02 ENCOUNTER — Telehealth: Payer: Self-pay

## 2015-08-02 ENCOUNTER — Ambulatory Visit (INDEPENDENT_AMBULATORY_CARE_PROVIDER_SITE_OTHER): Payer: Medicare Other

## 2015-08-02 ENCOUNTER — Other Ambulatory Visit: Payer: Self-pay

## 2015-08-02 ENCOUNTER — Other Ambulatory Visit (INDEPENDENT_AMBULATORY_CARE_PROVIDER_SITE_OTHER): Payer: Medicare Other | Admitting: *Deleted

## 2015-08-02 VITALS — BP 136/82 | HR 58 | Resp 16 | Ht 71.0 in

## 2015-08-02 DIAGNOSIS — I2510 Atherosclerotic heart disease of native coronary artery without angina pectoris: Secondary | ICD-10-CM | POA: Diagnosis not present

## 2015-08-02 DIAGNOSIS — R0789 Other chest pain: Secondary | ICD-10-CM

## 2015-08-02 NOTE — Telephone Encounter (Signed)
Pt states last Wednesday he had "sharp chest pain"  Pt c/o of Chest Pain: STAT if CP now or developed within 24 hours  1. Are you having CP right now? no  2. Are you experiencing any other symptoms (ex. SOB, nausea, vomiting, sweating)? Pt could "feel my heartbeat"   3. How long have you been experiencing CP? Started on Wednesday, ended on Friday  4. Is your CP continuous or coming and going? Coming and going  5. Have you taken Nitroglycerin? No, took an aspirin ?

## 2015-08-02 NOTE — Patient Instructions (Signed)
1.) Reason for visit: Labs. States he had chest pressure last week  2.) Name of MD requesting visit: none  3.) H&P: Pt come in today for lipid/liver profile. Pt states he experienced left arm and chest "throbbing" on Wednesday. Symptoms lasted <7minute. Friend gave him 325mg  ASA. Pain resolved on its own. Pt states Thursday and Friday, he experienced left shoulder pain, running diagonally across his chest. Only lasts for a few minutes. Denies nausea, diaphoresis, SOB. States "I feel fine today" and denies sx Saturday or Sunday.   4.) ROS related to problem: Pt states he has known left rotator cuff issues. Last week he reports strenuous activity as he has been working on his "clogged chimney". He states he feels it is related to the increased activity. I suggested an EKG but pt states he is unsure if insurance will pay. He states he will continue to monitor sx and report if CP returns. I have reviewed s/s that would need immediate attention in the ER. Pt verbalized understanding and declines any further intervention at this time.   5.) Assessment and plan per MD: Forward to MD

## 2015-08-03 ENCOUNTER — Telehealth: Payer: Self-pay

## 2015-08-03 LAB — LIPID PANEL
CHOL/HDL RATIO: 4.7 ratio (ref 0.0–5.0)
Cholesterol, Total: 151 mg/dL (ref 100–199)
HDL: 32 mg/dL — AB (ref >39)
LDL Calculated: 91 mg/dL (ref 0–99)
TRIGLYCERIDES: 138 mg/dL (ref 0–149)
VLDL Cholesterol Cal: 28 mg/dL (ref 5–40)

## 2015-08-03 LAB — HEPATIC FUNCTION PANEL
ALBUMIN: 4.6 g/dL (ref 3.5–5.5)
ALK PHOS: 70 IU/L (ref 39–117)
ALT: 37 IU/L (ref 0–44)
AST: 40 IU/L (ref 0–40)
Bilirubin Total: 0.6 mg/dL (ref 0.0–1.2)
Bilirubin, Direct: 0.14 mg/dL (ref 0.00–0.40)
TOTAL PROTEIN: 7 g/dL (ref 6.0–8.5)

## 2015-08-03 NOTE — Telephone Encounter (Signed)
Pt would like lab results. OK to leave detailed message on VM.

## 2015-08-03 NOTE — Telephone Encounter (Signed)
Pt called states the Rosuvastatin is too expensive and needs to be switched to something different. Please call.

## 2015-08-03 NOTE — Telephone Encounter (Signed)
Let's put him on simvastatin 40 mg once daily.

## 2015-08-03 NOTE — Telephone Encounter (Signed)
S/w [place]. Pt pays $92 for 3 month supply of crestor. She thinks this goes towards his out of pocket expense.  S/w pt who requests to change to simvastatin or lovastatin as he states he can get these at no cost. States he is unable to afford crestor. Pt experienced leg cramping while on pravastatin. Changed to 10mg  crestor in January.  Jan 12 OV notes: "His lipid profile was not optimal at all. He has not tried any other statins other than pravastatin which he takes every other day. Thus, I switched him to rosuvastatin 10 mg once daily. I recommend a target LDL of less than 70 given his mild nonobstructive coronary artery disease. I requested testing lipid and liver profile to be done in one month."  Recent labs show LDL of 91 which is not at target of 70. Advised pt to continue taking crestor as prescribed and will forward to Dr. Fletcher Anon to advise.

## 2015-08-04 ENCOUNTER — Other Ambulatory Visit: Payer: Self-pay

## 2015-08-04 MED ORDER — SIMVASTATIN 40 MG PO TABS: 40.0000 mg | ORAL_TABLET | Freq: Every day | ORAL | Status: DC

## 2015-08-04 NOTE — Telephone Encounter (Signed)
S/w pt who is agreeable w/plan to change simvastatin 40mg  daily. Prescription submitted to [facility]

## 2015-08-05 ENCOUNTER — Other Ambulatory Visit: Payer: Self-pay

## 2015-08-05 DIAGNOSIS — E785 Hyperlipidemia, unspecified: Secondary | ICD-10-CM

## 2015-08-25 ENCOUNTER — Other Ambulatory Visit: Payer: Self-pay | Admitting: Cardiovascular Disease

## 2015-10-05 ENCOUNTER — Other Ambulatory Visit: Payer: Self-pay

## 2015-10-20 ENCOUNTER — Other Ambulatory Visit: Payer: Self-pay | Admitting: Family Medicine

## 2015-10-20 NOTE — Telephone Encounter (Signed)
Sent. Thanks.   

## 2015-10-20 NOTE — Telephone Encounter (Signed)
Received refill electronically Last refill 04/27/15 120/5 Last office visit 05/11/15

## 2015-10-26 ENCOUNTER — Other Ambulatory Visit (INDEPENDENT_AMBULATORY_CARE_PROVIDER_SITE_OTHER): Payer: Medicare Other | Admitting: *Deleted

## 2015-10-26 DIAGNOSIS — E785 Hyperlipidemia, unspecified: Secondary | ICD-10-CM | POA: Diagnosis not present

## 2015-10-27 LAB — HEPATIC FUNCTION PANEL
ALK PHOS: 69 IU/L (ref 39–117)
ALT: 28 IU/L (ref 0–44)
AST: 31 IU/L (ref 0–40)
Albumin: 4.3 g/dL (ref 3.5–5.5)
BILIRUBIN, DIRECT: 0.18 mg/dL (ref 0.00–0.40)
Bilirubin Total: 0.7 mg/dL (ref 0.0–1.2)
TOTAL PROTEIN: 6.8 g/dL (ref 6.0–8.5)

## 2015-10-27 LAB — LIPID PANEL
CHOLESTEROL TOTAL: 182 mg/dL (ref 100–199)
Chol/HDL Ratio: 5.5 ratio units — ABNORMAL HIGH (ref 0.0–5.0)
HDL: 33 mg/dL — AB (ref >39)
LDL Calculated: 107 mg/dL — ABNORMAL HIGH (ref 0–99)
Triglycerides: 211 mg/dL — ABNORMAL HIGH (ref 0–149)
VLDL Cholesterol Cal: 42 mg/dL — ABNORMAL HIGH (ref 5–40)

## 2015-10-28 ENCOUNTER — Telehealth: Payer: Self-pay | Admitting: Cardiovascular Disease

## 2015-10-28 ENCOUNTER — Other Ambulatory Visit: Payer: Self-pay

## 2015-10-28 DIAGNOSIS — E785 Hyperlipidemia, unspecified: Secondary | ICD-10-CM

## 2015-10-28 MED ORDER — ATORVASTATIN CALCIUM 40 MG PO TABS: 40.0000 mg | ORAL_TABLET | Freq: Every day | ORAL | Status: DC

## 2015-10-28 NOTE — Telephone Encounter (Signed)
Per verbal from Dr. Fletcher Anon, schedule pt for liver and lipid profile six weeks after starting atorvastatin. S/w pt who prefers mid July as he will be out of town in June. Scheduled for July 13, 8:30am. Pt understands these are fasting labs.

## 2015-12-24 DIAGNOSIS — H25813 Combined forms of age-related cataract, bilateral: Secondary | ICD-10-CM | POA: Diagnosis not present

## 2015-12-30 ENCOUNTER — Other Ambulatory Visit (INDEPENDENT_AMBULATORY_CARE_PROVIDER_SITE_OTHER): Payer: Medicare Other

## 2015-12-30 DIAGNOSIS — E785 Hyperlipidemia, unspecified: Secondary | ICD-10-CM

## 2015-12-31 LAB — HEPATIC FUNCTION PANEL
ALK PHOS: 77 IU/L (ref 39–117)
ALT: 38 IU/L (ref 0–44)
AST: 31 IU/L (ref 0–40)
Albumin: 4.4 g/dL (ref 3.5–5.5)
Bilirubin Total: 0.8 mg/dL (ref 0.0–1.2)
Bilirubin, Direct: 0.17 mg/dL (ref 0.00–0.40)
Total Protein: 7.1 g/dL (ref 6.0–8.5)

## 2015-12-31 LAB — LIPID PANEL
CHOLESTEROL TOTAL: 149 mg/dL (ref 100–199)
Chol/HDL Ratio: 4.5 ratio units (ref 0.0–5.0)
HDL: 33 mg/dL — ABNORMAL LOW (ref >39)
LDL CALC: 80 mg/dL (ref 0–99)
TRIGLYCERIDES: 182 mg/dL — AB (ref 0–149)
VLDL Cholesterol Cal: 36 mg/dL (ref 5–40)

## 2016-02-22 ENCOUNTER — Other Ambulatory Visit: Payer: Self-pay | Admitting: Neurology

## 2016-02-23 ENCOUNTER — Other Ambulatory Visit: Payer: Self-pay | Admitting: Family Medicine

## 2016-04-17 ENCOUNTER — Other Ambulatory Visit: Payer: Self-pay | Admitting: Family Medicine

## 2016-04-22 ENCOUNTER — Other Ambulatory Visit: Payer: Self-pay | Admitting: Cardiovascular Disease

## 2016-05-04 ENCOUNTER — Other Ambulatory Visit: Payer: Self-pay | Admitting: Cardiovascular Disease

## 2016-05-04 ENCOUNTER — Other Ambulatory Visit: Payer: Self-pay | Admitting: Family Medicine

## 2016-05-16 ENCOUNTER — Other Ambulatory Visit: Payer: Medicare Other

## 2016-05-16 ENCOUNTER — Other Ambulatory Visit: Payer: Self-pay | Admitting: Family Medicine

## 2016-05-16 ENCOUNTER — Telehealth: Payer: Self-pay | Admitting: Family Medicine

## 2016-05-16 ENCOUNTER — Ambulatory Visit (INDEPENDENT_AMBULATORY_CARE_PROVIDER_SITE_OTHER): Payer: Medicare Other

## 2016-05-16 VITALS — BP 150/98 | HR 68 | Temp 98.6°F | Ht 70.5 in | Wt 231.2 lb

## 2016-05-16 DIAGNOSIS — E039 Hypothyroidism, unspecified: Secondary | ICD-10-CM

## 2016-05-16 DIAGNOSIS — Z125 Encounter for screening for malignant neoplasm of prostate: Secondary | ICD-10-CM

## 2016-05-16 DIAGNOSIS — G40909 Epilepsy, unspecified, not intractable, without status epilepticus: Secondary | ICD-10-CM

## 2016-05-16 DIAGNOSIS — I10 Essential (primary) hypertension: Secondary | ICD-10-CM | POA: Diagnosis not present

## 2016-05-16 DIAGNOSIS — Z0000 Encounter for general adult medical examination without abnormal findings: Secondary | ICD-10-CM | POA: Diagnosis not present

## 2016-05-16 LAB — BASIC METABOLIC PANEL
BUN: 15 mg/dL (ref 6–23)
CHLORIDE: 105 meq/L (ref 96–112)
CO2: 30 mEq/L (ref 19–32)
CREATININE: 0.84 mg/dL (ref 0.40–1.50)
Calcium: 10 mg/dL (ref 8.4–10.5)
GFR: 100.5 mL/min (ref >60.00)
Glucose, Bld: 94 mg/dL (ref 70–99)
Potassium: 5.1 mEq/L (ref 3.5–5.1)
Sodium: 142 mEq/L (ref 135–145)

## 2016-05-16 LAB — TSH: TSH: 3.2 u[IU]/mL (ref 0.35–4.50)

## 2016-05-16 LAB — PSA, MEDICARE: PSA: 2.6 ng/ml (ref 0.10–4.00)

## 2016-05-16 NOTE — Telephone Encounter (Signed)
I'll work on the hard copy.  Thanks.  

## 2016-05-16 NOTE — Telephone Encounter (Signed)
Pt dropped off ppw for [place] transportation cpe for early review. He is sch on 05/18/16 and asks that it gets faxed to [phone] after the cpe. I placed in Rx tower

## 2016-05-16 NOTE — Progress Notes (Signed)
PCP notes:   Health maintenance:  Flu vaccine - per pt, vaccine administered in Sept 2017  Abnormal screenings:   Hearing - failed  Patient concerns:   Pt reports increased episodes of sharp pain in eyes.   Nurse concerns:  Pt's BP was elevated. Discussed with patient about his use of sodium. Provided patient with info on DASH diet. Per pt, BP has been increasingly become elevated as he has been self-monitoring it at home.    Next PCP appt:   05/18/16 @ 0945  I reviewed health advisor's note, was available for consultation on the day of service listed in this note, and agree with documentation and plan. Elsie Stain, MD.

## 2016-05-16 NOTE — Patient Instructions (Addendum)
**Please review the DASH diet. If you have any questions, please discuss them with Dr. Damita Dunnings at your annual exam.   [name] , Thank you for taking time to come for your Medicare Wellness Visit. I appreciate your ongoing commitment to your health goals. Please review the following plan we discussed and let me know if I can assist you in the future.   These are the goals we discussed: Goals    . Increase physical activity          Starting 05/16/2016, I will continue to exercise for 15-30 min 3-4 days per week.        This is a list of the screening recommended for you and due dates:  Health Maintenance  Topic Date Due  . Colon Cancer Screening  02/25/2018  . Tetanus Vaccine  02/04/2022  . Flu Shot  Addressed  .  Hepatitis C: One time screening is recommended by Center for Disease Control  (CDC) for  adults born from [birth year] through 1966.   Completed  . HIV Screening  Completed   Preventive Care for Adults  A healthy lifestyle and preventive care can promote health and wellness. Preventive health guidelines for adults include the following key practices.  . A routine yearly physical is a good way to check with your health care provider about your health and preventive screening. It is a chance to share any concerns and updates on your health and to receive a thorough exam.  . Visit your dentist for a routine exam and preventive care every 6 months. Brush your teeth twice a day and floss once a day. Good oral hygiene prevents tooth decay and gum disease.  . The frequency of eye exams is based on your age, health, family medical history, use  of contact lenses, and other factors. Follow your health care provider's ecommendations for frequency of eye exams.  . Eat a healthy diet. Foods like vegetables, fruits, whole grains, low-fat dairy products, and lean protein foods contain the nutrients you need without too many calories. Decrease your intake of foods high in solid fats, added  sugars, and salt. Eat the right amount of calories for you. Get information about a proper diet from your health care provider, if necessary.  . Regular physical exercise is one of the most important things you can do for your health. Most adults should get at least 150 minutes of moderate-intensity exercise (any activity that increases your heart rate and causes you to sweat) each week. In addition, most adults need muscle-strengthening exercises on 2 or more days a week.  Silver Sneakers may be a benefit available to you. To determine eligibility, you may visit the website: www.silversneakers.com or contact program at (346)174-2074 Mon-Fri between 8AM-8PM.   . Maintain a healthy weight. The body mass index (BMI) is a screening tool to identify possible weight problems. It provides an estimate of body fat based on height and weight. Your health care provider can find your BMI and can help you achieve or maintain a healthy weight.   For adults 20 years and older: ? A BMI below 18.5 is considered underweight. ? A BMI of 18.5 to 24.9 is normal. ? A BMI of 25 to 29.9 is considered overweight. ? A BMI of 30 and above is considered obese.   . Maintain normal blood lipids and cholesterol levels by exercising and minimizing your intake of saturated fat. Eat a balanced diet with plenty of fruit and vegetables. Blood tests for lipids  and cholesterol should begin at age 38 and be repeated every 5 years. If your lipid or cholesterol levels are high, you are over 50, or you are at high risk for heart disease, you may need your cholesterol levels checked more frequently. Ongoing high lipid and cholesterol levels should be treated with medicines if diet and exercise are not working.  . If you smoke, find out from your health care provider how to quit. If you do not use tobacco, please do not start.  . If you choose to drink alcohol, please do not consume more than 2 drinks per day. One drink is considered to  be 12 ounces (355 mL) of beer, 5 ounces (148 mL) of wine, or 1.5 ounces (44 mL) of liquor.  . If you are 45-61 years old, ask your health care provider if you should take aspirin to prevent strokes.  . Use sunscreen. Apply sunscreen liberally and repeatedly throughout the day. You should seek shade when your shadow is shorter than you. Protect yourself by wearing long sleeves, pants, a wide-brimmed hat, and sunglasses year round, whenever you are outdoors.  . Once a month, do a whole body skin exam, using a mirror to look at the skin on your back. Tell your health care provider of new moles, moles that have irregular borders, moles that are larger than a pencil eraser, or moles that have changed in shape or color.
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
## 2016-05-16 NOTE — Progress Notes (Signed)
Pre visit review using our clinic review tool, if applicable. No additional management support is needed unless otherwise documented below in the visit note. 

## 2016-05-16 NOTE — Telephone Encounter (Signed)
Form placed in Dr. Duncan's In Box. 

## 2016-05-17 LAB — VALPROIC ACID LEVEL: Valproic Acid Lvl: 99.1 ug/mL (ref 50.0–100.0)

## 2016-05-18 ENCOUNTER — Encounter: Payer: Self-pay | Admitting: Family Medicine

## 2016-05-18 ENCOUNTER — Ambulatory Visit (INDEPENDENT_AMBULATORY_CARE_PROVIDER_SITE_OTHER): Payer: Medicare Other | Admitting: Family Medicine

## 2016-05-18 VITALS — BP 134/90 | HR 53 | Temp 98.0°F | Ht 71.0 in | Wt 230.5 lb

## 2016-05-18 DIAGNOSIS — I10 Essential (primary) hypertension: Secondary | ICD-10-CM

## 2016-05-18 DIAGNOSIS — G43909 Migraine, unspecified, not intractable, without status migrainosus: Secondary | ICD-10-CM

## 2016-05-18 DIAGNOSIS — E039 Hypothyroidism, unspecified: Secondary | ICD-10-CM | POA: Diagnosis not present

## 2016-05-18 DIAGNOSIS — G40909 Epilepsy, unspecified, not intractable, without status epilepticus: Secondary | ICD-10-CM

## 2016-05-18 DIAGNOSIS — Z8042 Family history of malignant neoplasm of prostate: Secondary | ICD-10-CM

## 2016-05-18 DIAGNOSIS — E785 Hyperlipidemia, unspecified: Secondary | ICD-10-CM

## 2016-05-18 MED ORDER — LEVOTHYROXINE SODIUM 150 MCG PO TABS: ORAL_TABLET | ORAL | 0 refills | Status: DC

## 2016-05-18 MED ORDER — METOPROLOL TARTRATE 25 MG PO TABS: 25.0000 mg | ORAL_TABLET | Freq: Every day | ORAL | 3 refills | Status: DC

## 2016-05-18 MED ORDER — LEVOTHYROXINE SODIUM 150 MCG PO TABS: ORAL_TABLET | ORAL | 3 refills | Status: DC

## 2016-05-18 NOTE — Patient Instructions (Addendum)
Don't change your meds for now.   Ask the eye clinic about the eye pain- unclear if these are ocular migraines.   Take care.  Glad to see you.  Update me as needed.

## 2016-05-18 NOTE — Progress Notes (Signed)
Pre visit review using our clinic review tool, if applicable. No additional management support is needed unless otherwise documented below in the visit note. 

## 2016-05-18 NOTE — Assessment & Plan Note (Addendum)
>  40 minutes spent in face to face time with patient, >50% spent in counselling or coordination of care. Controlled, continue as is with meds.  Labs d/w pt.  All d/w pt.  Okay to drive.  No contraindication.

## 2016-05-18 NOTE — Progress Notes (Signed)
Elevated Cholesterol: Using medications without problems:yes Muscle aches: no Diet compliance:yes Exercise:yes Encouraged diet and exercise.    Hypertension:    Using medication without problems or lightheadedness: yes Chest pain with exertion:no Edema:no Short of breath:no Labs d/w pt.   FH prostate cancer noted. PSA wnl, d/w pt.  Hearing - failed, d/w pt.  Declined hearing aids.  D/w pt.   Pt reports increased episodes of sharp pain in eyes, intermittent, can be in one eye or the other.  Eyes get watery and red at the time then the episode resolves. Brief episodes. No vision loss before during or after the events. Had seen eye clinic this past July.    Hypothyroid.  TSH wnl, compliant, no ADE on med.  No tmg.    Seizure d/o.  No events.  Compliant with meds.  No ADE.  No focal neuro changes.  Driving form done.  No contraindication to driving.  Drug level wnl.  D/w pt.  See scanned forms.    H/o migraines.  Taking maxalt prn.  No ADE on med. No recent migraines, not in the last two months.    PMH and SH reviewed  Meds, vitals, and allergies reviewed.   ROS: Per HPI unless specifically indicated in ROS section   GEN: nad, alert and oriented HEENT: mucous membranes moist, PERRL, EOMI NECK: supple w/o LA CV: rrr. PULM: ctab, no inc wob ABD: soft, +bs EXT: no edema SKIN: no acute rash but varicose veins noted proximal to L knee CN 2-12 wnl B, S/S/DTR wnl x4

## 2016-05-19 DIAGNOSIS — Z8042 Family history of malignant neoplasm of prostate: Secondary | ICD-10-CM | POA: Insufficient documentation

## 2016-05-19 NOTE — Assessment & Plan Note (Signed)
Reasonable control on last check, continue as is.  D/w pt.  He agrees.

## 2016-05-19 NOTE — Assessment & Plan Note (Signed)
Controlled, continue as is.  D/w pt.  He agrees.  Labs d/w pt.

## 2016-05-19 NOTE — Assessment & Plan Note (Addendum)
Typical episodes are controlled, no change in meds- but unclear if the eye sx are ocular migraines.  I want him to f/'u with the eye clinic. He agrees.  D/w pt.

## 2016-05-19 NOTE — Assessment & Plan Note (Signed)
Controlled, no change in meds.  He agrees.  D/w pt.

## 2016-05-19 NOTE — Assessment & Plan Note (Signed)
FH prostate cancer noted. PSA wnl, d/w pt.

## 2016-06-19 DIAGNOSIS — H01009 Unspecified blepharitis unspecified eye, unspecified eyelid: Secondary | ICD-10-CM

## 2016-06-19 HISTORY — DX: Unspecified blepharitis unspecified eye, unspecified eyelid: H01.009

## 2016-07-11 ENCOUNTER — Ambulatory Visit: Payer: Self-pay | Admitting: Cardiovascular Disease

## 2016-07-12 ENCOUNTER — Encounter: Payer: Self-pay | Admitting: Family Medicine

## 2016-07-12 ENCOUNTER — Ambulatory Visit (INDEPENDENT_AMBULATORY_CARE_PROVIDER_SITE_OTHER): Payer: Medicare Other | Admitting: Family Medicine

## 2016-07-12 VITALS — BP 148/90 | HR 77 | Temp 98.7°F | Ht 70.5 in | Wt 220.2 lb

## 2016-07-12 DIAGNOSIS — R059 Cough, unspecified: Secondary | ICD-10-CM

## 2016-07-12 DIAGNOSIS — J111 Influenza due to unidentified influenza virus with other respiratory manifestations: Secondary | ICD-10-CM | POA: Diagnosis not present

## 2016-07-12 DIAGNOSIS — R05 Cough: Secondary | ICD-10-CM | POA: Diagnosis not present

## 2016-07-12 LAB — POC INFLUENZA A&B (BINAX/QUICKVUE)
INFLUENZA B, POC: NEGATIVE
Influenza A, POC: POSITIVE — AB

## 2016-07-12 MED ORDER — OSELTAMIVIR PHOSPHATE 75 MG PO CAPS
75.0000 mg | ORAL_CAPSULE | Freq: Two times a day (BID) | ORAL | 0 refills | Status: DC
Start: 2016-07-12 — End: 2016-08-03

## 2016-07-12 MED ORDER — HYDROCODONE-HOMATROPINE 5-1.5 MG/5ML PO SYRP: ORAL_SOLUTION | ORAL | 0 refills | Status: DC

## 2016-07-12 NOTE — Progress Notes (Signed)
Pre visit review using our clinic review tool, if applicable. No additional management support is needed unless otherwise documented below in the visit note. 
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
## 2016-07-18 DIAGNOSIS — H01003 Unspecified blepharitis right eye, unspecified eyelid: Secondary | ICD-10-CM | POA: Diagnosis not present

## 2016-07-19 ENCOUNTER — Telehealth: Payer: Self-pay

## 2016-07-19 NOTE — Telephone Encounter (Signed)
I recommend not changing his blood pressure medications.  He can follow-up here if he needs to get his blood pressure addressed. Thanks.

## 2016-07-19 NOTE — Telephone Encounter (Signed)
Scheduled

## 2016-07-19 NOTE — Telephone Encounter (Signed)
Pt left v/m; [facility] nurse saw pt today at his home and pt was advised by [facility] nurse to change metoprolol to lisinopril which would be easier on pts kidneys. Pt request cb. Pt last saw Dr Damita Dunnings on 05/18/16.

## 2016-07-26 DIAGNOSIS — L578 Other skin changes due to chronic exposure to nonionizing radiation: Secondary | ICD-10-CM | POA: Diagnosis not present

## 2016-07-26 DIAGNOSIS — Z85828 Personal history of other malignant neoplasm of skin: Secondary | ICD-10-CM | POA: Diagnosis not present

## 2016-07-26 DIAGNOSIS — L739 Follicular disorder, unspecified: Secondary | ICD-10-CM | POA: Diagnosis not present

## 2016-07-26 DIAGNOSIS — L570 Actinic keratosis: Secondary | ICD-10-CM | POA: Diagnosis not present

## 2016-07-26 DIAGNOSIS — L700 Acne vulgaris: Secondary | ICD-10-CM | POA: Diagnosis not present

## 2016-08-03 ENCOUNTER — Telehealth: Payer: Self-pay | Admitting: Cardiovascular Disease

## 2016-08-03 ENCOUNTER — Ambulatory Visit: Payer: Medicare Other | Admitting: Family Medicine

## 2016-08-03 ENCOUNTER — Encounter: Payer: Self-pay | Admitting: Cardiovascular Disease

## 2016-08-03 ENCOUNTER — Ambulatory Visit (INDEPENDENT_AMBULATORY_CARE_PROVIDER_SITE_OTHER): Payer: Medicare Other | Admitting: Cardiovascular Disease

## 2016-08-03 ENCOUNTER — Ambulatory Visit (INDEPENDENT_AMBULATORY_CARE_PROVIDER_SITE_OTHER): Payer: Medicare Other | Admitting: Family Medicine

## 2016-08-03 ENCOUNTER — Encounter: Payer: Self-pay | Admitting: Family Medicine

## 2016-08-03 VITALS — BP 150/82 | HR 57 | Ht 71.0 in | Wt 228.5 lb

## 2016-08-03 DIAGNOSIS — E785 Hyperlipidemia, unspecified: Secondary | ICD-10-CM

## 2016-08-03 DIAGNOSIS — I10 Essential (primary) hypertension: Secondary | ICD-10-CM

## 2016-08-03 DIAGNOSIS — I2510 Atherosclerotic heart disease of native coronary artery without angina pectoris: Secondary | ICD-10-CM | POA: Diagnosis not present

## 2016-08-03 DIAGNOSIS — G43909 Migraine, unspecified, not intractable, without status migrainosus: Secondary | ICD-10-CM | POA: Diagnosis not present

## 2016-08-03 DIAGNOSIS — G40909 Epilepsy, unspecified, not intractable, without status epilepticus: Secondary | ICD-10-CM | POA: Diagnosis not present

## 2016-08-03 MED ORDER — ATORVASTATIN CALCIUM 40 MG PO TABS: ORAL_TABLET | ORAL | 3 refills | Status: DC

## 2016-08-03 MED ORDER — CHLORTHALIDONE 25 MG PO TABS: 25.0000 mg | ORAL_TABLET | Freq: Every day | ORAL | 3 refills | Status: DC

## 2016-08-03 NOTE — Progress Notes (Signed)
BP had been elevated, h/o borderline high K in the past.  Cards elected to avoid ARB and ACE due to h/o nearly high K.  He is going to start chlorthalidone.  D/w about rationale. He'll f/u with cards/lab about 1 week after med start.    He has occ/rare migraine, improved with maxalt prn.  No ADE on med .   His flu sx are resolved.  Cough is resolved.  He feels better.    He had forms from social security.  I had a copy of the forms scanned.  He has been advised not to work given his hx and medications, by [employer] MDs.  He doesn't have recent seizures but at this point with his current meds I wouldn't encourage him to work.  He clearly has some slowing of information processing due to meds, has had since starting current meds.    He prev thought he had a tooth or gum ache on the R upper side, but sinuses weren't ttp prev.  He never saw anything on the gum.  He put an aspirin on the area and that seemed to help.    Meds, vitals, and allergies reviewed.   ROS: Per HPI unless specifically indicated in ROS section   GEN: nad, alert and oriented, speech is slightly slow.   HEENT: mucous membranes moist, Tm wnl B, nasal exam slightly stuffy, R max sinus minimally ttp, gum line wnl.   NECK: supple w/o LA CV: rrr. PULM: ctab, no inc wob ABD: soft, +bs EXT: trace, almost no BLE edema

## 2016-08-03 NOTE — Progress Notes (Signed)
Pre visit review using our clinic review tool, if applicable. No additional management support is needed unless otherwise documented below in the visit note. 

## 2016-08-03 NOTE — Patient Instructions (Addendum)
Medication Instructions:  Your physician has recommended you make the following change in your medication:  START taking chlorthalidone 25mg  once daily   Labwork: BMET in one week after your first dose of chlorthalidone at [facility]  Testing/Procedures: none  Follow-Up: Your physician wants you to follow-up in: one year with Dr. Fletcher Anon.  You will receive a reminder letter in the mail two months in advance. If you don't receive a letter, please call our office to schedule the follow-up appointment.   Any Other Special Instructions Will Be Listed Below (If Applicable).     If you need a refill on your cardiac medications before your next appointment, please call your pharmacy.

## 2016-08-03 NOTE — Telephone Encounter (Signed)
Pt called back, just wanted to make sure his new medication from today was sent to [facility]. Please call and advise. Pt also states that there were some meds on his list that he doesn't take anymore.

## 2016-08-03 NOTE — Patient Instructions (Addendum)
Send in your forms.   I think the chlorthalidone makes sense.   Take care.  Glad to see you.  Update me as needed.  If the sinus pain is worse, then let me know.

## 2016-08-03 NOTE — Telephone Encounter (Signed)
S/w patient. He wanted to make sure we had sent a refill for Atorvastatin and the chlorathalidone which was prescribed today to [facility]. Each of these prescriptions were sent to [facility]. He verbalized understanding and was very Patent attorney.
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
## 2016-08-04 NOTE — Assessment & Plan Note (Signed)
Continue when necessary Maxalt.

## 2016-08-04 NOTE — Assessment & Plan Note (Addendum)
Agree with chlorthalidone use per cardiology. Appreciate help of all involved. Rationale discussed with patient. He agrees.

## 2016-08-04 NOTE — Assessment & Plan Note (Signed)
No recent events. Compliant with medication. He has cognitive slowing, slowing of information processing since he is been on his current medications. He has been previously recommended not work. I think this is reasonable.

## 2016-08-11 ENCOUNTER — Ambulatory Visit: Payer: Medicare Other | Admitting: Family Medicine

## 2016-08-16 ENCOUNTER — Other Ambulatory Visit
Admission: RE | Admit: 2016-08-16 | Discharge: 2016-08-16 | Disposition: A | Discharge status: Home / Self Care | Payer: Medicare Other | Source: Ambulatory Visit | Attending: Cardiovascular Disease | Admitting: Cardiovascular Disease

## 2016-08-16 DIAGNOSIS — I10 Essential (primary) hypertension: Secondary | ICD-10-CM | POA: Diagnosis not present

## 2016-08-16 LAB — BASIC METABOLIC PANEL
ANION GAP: 10 (ref 5–15)
BUN: 18 mg/dL (ref 6–20)
CHLORIDE: 102 mmol/L (ref 101–111)
CO2: 23 mmol/L (ref 22–32)
Calcium: 9.6 mg/dL (ref 8.9–10.3)
Creatinine, Ser: 0.87 mg/dL (ref 0.61–1.24)
GFR calc Af Amer: >60 mL/min (ref >60)
GFR calc non Af Amer: >60 mL/min (ref >60)
GLUCOSE: 92 mg/dL (ref 65–99)
POTASSIUM: 4.2 mmol/L (ref 3.5–5.1)
SODIUM: 135 mmol/L (ref 135–145)

## 2016-11-16 DIAGNOSIS — L570 Actinic keratosis: Secondary | ICD-10-CM | POA: Diagnosis not present

## 2016-11-16 DIAGNOSIS — L820 Inflamed seborrheic keratosis: Secondary | ICD-10-CM | POA: Diagnosis not present

## 2016-11-16 DIAGNOSIS — L812 Freckles: Secondary | ICD-10-CM | POA: Diagnosis not present

## 2016-11-16 DIAGNOSIS — D1800 Hemangioma unspecified site: Secondary | ICD-10-CM | POA: Diagnosis not present

## 2016-11-16 DIAGNOSIS — D229 Melanocytic nevi, unspecified: Secondary | ICD-10-CM | POA: Diagnosis not present

## 2016-11-25 ENCOUNTER — Other Ambulatory Visit: Payer: Self-pay | Admitting: Family Medicine

## 2017-01-28 ENCOUNTER — Other Ambulatory Visit: Payer: Self-pay | Admitting: Family Medicine

## 2017-01-29 NOTE — Telephone Encounter (Signed)
Electronic refill request.  Divalproex Last office visit:   08/03/16 Last Filled:    326 tablet 0 11/27/2016  Please advise.

## 2017-01-30 NOTE — Telephone Encounter (Signed)
Sent. Thanks.   

## 2017-04-03 ENCOUNTER — Other Ambulatory Visit: Payer: Self-pay | Admitting: Cardiovascular Disease

## 2017-04-03 DIAGNOSIS — I10 Essential (primary) hypertension: Secondary | ICD-10-CM

## 2017-04-17 ENCOUNTER — Other Ambulatory Visit: Payer: Self-pay | Admitting: Cardiovascular Disease

## 2017-04-17 ENCOUNTER — Other Ambulatory Visit: Payer: Self-pay | Admitting: Family Medicine

## 2017-04-17 NOTE — Telephone Encounter (Signed)
Electronic refill request. Last office visit:   08/03/16 Last Filled:   Divalproex  326 tablet 1 01/30/2017

## 2017-04-18 ENCOUNTER — Other Ambulatory Visit: Payer: Self-pay

## 2017-04-18 DIAGNOSIS — I10 Essential (primary) hypertension: Secondary | ICD-10-CM

## 2017-04-18 MED ORDER — ATORVASTATIN CALCIUM 40 MG PO TABS: 40.0000 mg | ORAL_TABLET | Freq: Every day | ORAL | 0 refills | Status: DC

## 2017-04-18 MED ORDER — CHLORTHALIDONE 25 MG PO TABS: 25.0000 mg | ORAL_TABLET | Freq: Every day | ORAL | 0 refills | Status: DC

## 2017-04-18 NOTE — Telephone Encounter (Signed)
Sent. Thanks.   

## 2017-05-13 ENCOUNTER — Other Ambulatory Visit: Payer: Self-pay | Admitting: Family Medicine

## 2017-05-13 DIAGNOSIS — G40909 Epilepsy, unspecified, not intractable, without status epilepticus: Secondary | ICD-10-CM

## 2017-05-13 DIAGNOSIS — E039 Hypothyroidism, unspecified: Secondary | ICD-10-CM

## 2017-05-13 DIAGNOSIS — E785 Hyperlipidemia, unspecified: Secondary | ICD-10-CM

## 2017-05-13 DIAGNOSIS — Z125 Encounter for screening for malignant neoplasm of prostate: Secondary | ICD-10-CM

## 2017-05-13 DIAGNOSIS — Z79899 Other long term (current) drug therapy: Secondary | ICD-10-CM

## 2017-05-17 ENCOUNTER — Ambulatory Visit (INDEPENDENT_AMBULATORY_CARE_PROVIDER_SITE_OTHER): Payer: Medicare Other

## 2017-05-17 VITALS — BP 120/82 | HR 68 | Temp 98.1°F | Ht 70.5 in | Wt 200.0 lb

## 2017-05-17 DIAGNOSIS — Z79899 Other long term (current) drug therapy: Secondary | ICD-10-CM | POA: Diagnosis not present

## 2017-05-17 DIAGNOSIS — E785 Hyperlipidemia, unspecified: Secondary | ICD-10-CM | POA: Diagnosis not present

## 2017-05-17 DIAGNOSIS — G40909 Epilepsy, unspecified, not intractable, without status epilepticus: Secondary | ICD-10-CM

## 2017-05-17 DIAGNOSIS — Z0000 Encounter for general adult medical examination without abnormal findings: Secondary | ICD-10-CM

## 2017-05-17 DIAGNOSIS — E039 Hypothyroidism, unspecified: Secondary | ICD-10-CM

## 2017-05-17 DIAGNOSIS — Z125 Encounter for screening for malignant neoplasm of prostate: Secondary | ICD-10-CM

## 2017-05-17 LAB — COMPREHENSIVE METABOLIC PANEL
ALT: 37 U/L (ref 0–53)
AST: 34 U/L (ref 0–37)
Albumin: 4.6 g/dL (ref 3.5–5.2)
Alkaline Phosphatase: 54 U/L (ref 39–117)
BUN: 21 mg/dL (ref 6–23)
CHLORIDE: 100 meq/L (ref 96–112)
CO2: 32 mEq/L (ref 19–32)
CREATININE: 0.89 mg/dL (ref 0.40–1.50)
Calcium: 10 mg/dL (ref 8.4–10.5)
GFR: 93.67 mL/min (ref >60.00)
GLUCOSE: 95 mg/dL (ref 70–99)
POTASSIUM: 4.3 meq/L (ref 3.5–5.1)
SODIUM: 139 meq/L (ref 135–145)
Total Bilirubin: 1.1 mg/dL (ref 0.2–1.2)
Total Protein: 7.1 g/dL (ref 6.0–8.3)

## 2017-05-17 LAB — TSH: TSH: 2.57 u[IU]/mL (ref 0.35–4.50)

## 2017-05-17 LAB — LIPID PANEL
CHOL/HDL RATIO: 3
Cholesterol: 110 mg/dL (ref 0–200)
HDL: 41.6 mg/dL (ref >39.00)
LDL CALC: 53 mg/dL (ref 0–99)
NONHDL: 68.53
Triglycerides: 80 mg/dL (ref 0.0–149.0)
VLDL: 16 mg/dL (ref 0.0–40.0)

## 2017-05-17 LAB — PSA, MEDICARE: PSA: 3.49 ng/ml (ref 0.10–4.00)

## 2017-05-17 NOTE — Progress Notes (Signed)
Pre visit review using our clinic review tool, if applicable. No additional management support is needed unless otherwise documented below in the visit note. 

## 2017-05-17 NOTE — Progress Notes (Signed)
PCP notes:   Health maintenance:   No gaps identified.  Abnormal screenings:   Hearing - failed  Hearing Screening   125Hz  250Hz  500Hz  1000Hz  2000Hz  3000Hz  4000Hz  6000Hz  8000Hz   Right ear:   40 40 0  0    Left ear:   40 0 0  0      Patient concerns:   Patient reports increased cramping in legs and feet since starting Chlorthalidone. PCP please address at next appt.  Nurse concerns:  None  Next PCP appt:   05/21/17 @ 0945  I reviewed health advisor's note, was available for consultation on the day of service listed in this note, and agree with documentation and plan. Elsie Stain, MD.

## 2017-05-17 NOTE — Patient Instructions (Signed)
Kevin Charles , Thank you for taking time to come for your Medicare Wellness Visit. I appreciate your ongoing commitment to your health goals. Please review the following plan we discussed and let me know if I can assist you in the future.   These are the goals we discussed: Goals    . Follow up with Primary Care Provider     Starting 05/17/2017, I will continue to take medications as prescribed and to follow up with primary care provider as directed.        This is a list of the screening recommended for you and due dates:  Health Maintenance  Topic Date Due  . Colon Cancer Screening  02/25/2018  . Tetanus Vaccine  02/04/2022  . Flu Shot  Completed  .  Hepatitis C: One time screening is recommended by Center for Disease Control  (CDC) for  adults born from [birth year] through 1965.   Completed  . HIV Screening  Completed   Preventive Care for Adults  A healthy lifestyle and preventive care can promote health and wellness. Preventive health guidelines for adults include the following key practices.  . A routine yearly physical is a good way to check with your health care provider about your health and preventive screening. It is a chance to share any concerns and updates on your health and to receive a thorough exam.  . Visit your dentist for a routine exam and preventive care every 6 months. Brush your teeth twice a day and floss once a day. Good oral hygiene prevents tooth decay and gum disease.  . The frequency of eye exams is based on your age, health, family medical history, use  of contact lenses, and other factors. Follow your health care provider's recommendations for frequency of eye exams.  . Eat a healthy diet. Foods like vegetables, fruits, whole grains, low-fat dairy products, and lean protein foods contain the nutrients you need without too many calories. Decrease your intake of foods high in solid fats, added sugars, and salt. Eat the right amount of calories for you. Get  information about a proper diet from your health care provider, if necessary.  . Regular physical exercise is one of the most important things you can do for your health. Most adults should get at least 150 minutes of moderate-intensity exercise (any activity that increases your heart rate and causes you to sweat) each week. In addition, most adults need muscle-strengthening exercises on 2 or more days a week.  Silver Sneakers may be a benefit available to you. To determine eligibility, you may visit the website: www.silversneakers.com or contact program at (531)638-2582 Mon-Fri between 8AM-8PM.   . Maintain a healthy weight. The body mass index (BMI) is a screening tool to identify possible weight problems. It provides an estimate of body fat based on height and weight. Your health care provider can find your BMI and can help you achieve or maintain a healthy weight.   For adults 20 years and older: ? A BMI below 18.5 is considered underweight. ? A BMI of 18.5 to 24.9 is normal. ? A BMI of 25 to 29.9 is considered overweight. ? A BMI of 30 and above is considered obese.   . Maintain normal blood lipids and cholesterol levels by exercising and minimizing your intake of saturated fat. Eat a balanced diet with plenty of fruit and vegetables. Blood tests for lipids and cholesterol should begin at age 87 and be repeated every 5 years. If your lipid or cholesterol  levels are high, you are over 50, or you are at high risk for heart disease, you may need your cholesterol levels checked more frequently. Ongoing high lipid and cholesterol levels should be treated with medicines if diet and exercise are not working.  . If you smoke, find out from your health care provider how to quit. If you do not use tobacco, please do not start.  . If you choose to drink alcohol, please do not consume more than 2 drinks per day. One drink is considered to be 12 ounces (355 mL) of beer, 5 ounces (148 mL) of wine, or 1.5  ounces (44 mL) of liquor.  . If you are 62-50 years old, ask your health care provider if you should take aspirin to prevent strokes.  . Use sunscreen. Apply sunscreen liberally and repeatedly throughout the day. You should seek shade when your shadow is shorter than you. Protect yourself by wearing long sleeves, pants, a wide-brimmed hat, and sunglasses year round, whenever you are outdoors.  . Once a month, do a whole body skin exam, using a mirror to look at the skin on your back. Tell your health care provider of new moles, moles that have irregular borders, moles that are larger than a pencil eraser, or moles that have changed in shape or color.
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
## 2017-05-18 LAB — VALPROIC ACID LEVEL: VALPROIC ACID LVL: 87.8 mg/L (ref 50.0–100.0)

## 2017-05-21 ENCOUNTER — Encounter: Payer: Self-pay | Admitting: Family Medicine

## 2017-05-21 ENCOUNTER — Ambulatory Visit (INDEPENDENT_AMBULATORY_CARE_PROVIDER_SITE_OTHER): Payer: Medicare Other | Admitting: Family Medicine

## 2017-05-21 VITALS — BP 120/82 | HR 68 | Temp 98.1°F | Ht 70.5 in | Wt 200.0 lb

## 2017-05-21 DIAGNOSIS — E785 Hyperlipidemia, unspecified: Secondary | ICD-10-CM | POA: Diagnosis not present

## 2017-05-21 DIAGNOSIS — G40909 Epilepsy, unspecified, not intractable, without status epilepticus: Secondary | ICD-10-CM | POA: Diagnosis not present

## 2017-05-21 DIAGNOSIS — E039 Hypothyroidism, unspecified: Secondary | ICD-10-CM | POA: Diagnosis not present

## 2017-05-21 DIAGNOSIS — I10 Essential (primary) hypertension: Secondary | ICD-10-CM | POA: Diagnosis not present

## 2017-05-21 DIAGNOSIS — K219 Gastro-esophageal reflux disease without esophagitis: Secondary | ICD-10-CM | POA: Diagnosis not present

## 2017-05-21 DIAGNOSIS — Z125 Encounter for screening for malignant neoplasm of prostate: Secondary | ICD-10-CM

## 2017-05-21 DIAGNOSIS — Z0000 Encounter for general adult medical examination without abnormal findings: Secondary | ICD-10-CM | POA: Insufficient documentation

## 2017-05-21 DIAGNOSIS — G43909 Migraine, unspecified, not intractable, without status migrainosus: Secondary | ICD-10-CM | POA: Diagnosis not present

## 2017-05-21 DIAGNOSIS — Z8042 Family history of malignant neoplasm of prostate: Secondary | ICD-10-CM

## 2017-05-21 MED ORDER — CHLORTHALIDONE 25 MG PO TABS: 25.0000 mg | ORAL_TABLET | ORAL | Status: DC

## 2017-05-21 NOTE — Progress Notes (Signed)
Hearing - failed, d/w pt.  Declined hearing aids.   PSA not elevated >4 but higher then prev, has been in the 3s prev, d/w pt. Variable in the last few years.  He has some LUTS at baseline.  Some stress incontinence at baseline.   Advance directive- Jobie Quaker designated if patient were incapacitated.  Colonoscopy 2014.   Elevated Cholesterol: Using medications without problems: yes, able to tolerate Muscle aches: not from statin.   Diet compliance: yes Exercise:yes Weight is down, intentional loss.  He feels better with the weight loss.    HTN. He reports increased but not daily cramping in legs and feet since starting chlorthalidone.  Not exertional.  D/w pt.  Taking 25mg  a day.  Not SOB. No exertional chest pain.  No BLE edema.    SZ d/o.  No ADE on med.  No SZ activity in 10+ years.  Compliant.  Labs d/w pt.    Hypothyroidism.  TSH wnl.  D/w pt.  No ADE on med.  Compliant.  No neck mass or pain.    Migraines.  Using maxalt prn.  No ADE on med.  A few migraines in the summer.  Tylenol usually helps with some occ use of maxalt.    GERD.  Still on PPI, w/o ADE.  Compliant.   With relief from med.    PMH and SH reviewed  Meds, vitals, and allergies reviewed.   ROS: Per HPI unless specifically indicated in ROS section   GEN: nad, alert and oriented HEENT: mucous membranes moist NECK: supple w/o LA CV: rrr. PULM: ctab, no inc wob ABD: soft, +bs EXT: no edema SKIN: no acute rash CN 2-12 wnl B, S/S wnl x4

## 2017-05-21 NOTE — Assessment & Plan Note (Signed)
Still on PPI, w/o ADE.  Compliant.

## 2017-05-21 NOTE — Assessment & Plan Note (Signed)
PSA not elevated >4 but higher then prev, has been in the 3s prev, d/w pt. Variable in the last few years.  He has some LUTS at baseline.  Some stress incontinence at baseline.  Okay to recheck PSA in 6 months.   D/w pt.

## 2017-05-21 NOTE — Assessment & Plan Note (Signed)
TSH wnl.  D/w pt.  No ADE on med.  Compliant.  No neck mass or pain.

## 2017-05-21 NOTE — Assessment & Plan Note (Signed)
No ADE on med.  No SZ activity in 10+ years.  Compliant.  Labs d/w pt, drug level at goal, continue as is.

## 2017-05-21 NOTE — Patient Instructions (Addendum)
Call your insurance and tell them you had a medicare wellness visit.   Recheck PSA in about 6 months at a lab visit.  Try taking the chlorthalidone every other day and see if that helps with the cramps.  Update [place] as needed.  Take care.  Glad to see you.

## 2017-05-21 NOTE — Assessment & Plan Note (Signed)
D/w pt about chlorthalidone QOD to see if that helps with cramping.  Update me as needed.  He agrees.

## 2017-05-21 NOTE — Assessment & Plan Note (Signed)
Hearing - failed, d/w pt.  Declined hearing aids.   Advance directive- Jobie Quaker designated if patient were incapacitated.  Colonoscopy 2014.

## 2017-05-21 NOTE — Assessment & Plan Note (Signed)
Using maxalt prn.  No ADE on med.  A few migraines in the summer.  Tylenol usually helps with some occ use of maxalt.  Continue as is.  He agrees.

## 2017-05-21 NOTE — Assessment & Plan Note (Signed)
Weight is down, intentional loss with diet and exercise, discussed.  He feels better with the weight loss. Continue statin, labs d/w pt.  Lipids at goal.  >25 minutes spent in face to face time with patient, >50% spent in counselling or coordination of care.

## 2017-07-10 ENCOUNTER — Telehealth: Payer: Self-pay | Admitting: Family Medicine

## 2017-07-10 NOTE — Telephone Encounter (Signed)
I spoke with pt and he wanted to go over med list on his chart at [facility] since talking with [facility] rep about med list. Reviewed med list with pt and the med list on pts chart is correct per pt. Nothing further needed.

## 2017-07-10 NOTE — Telephone Encounter (Signed)
Copied from [place] [id number]. Topic: Quick Communication - See Telephone Encounter >> Jul 10, 2017  9:23 AM Synthia Innocent wrote: CRM for notification. See Telephone encounter for: [facility] is calling requesting we call and speak to patient to go over meds currently taking.   07/10/17.

## 2017-07-10 NOTE — Telephone Encounter (Signed)
Pt called to go over med list with nurse, requesting call  On his mobile number - [phone]

## 2017-08-10 ENCOUNTER — Encounter: Payer: Self-pay | Admitting: Family Medicine

## 2017-08-10 ENCOUNTER — Ambulatory Visit (INDEPENDENT_AMBULATORY_CARE_PROVIDER_SITE_OTHER): Payer: Medicare Other | Admitting: Family Medicine

## 2017-08-10 ENCOUNTER — Other Ambulatory Visit: Payer: Self-pay

## 2017-08-10 DIAGNOSIS — J029 Acute pharyngitis, unspecified: Secondary | ICD-10-CM | POA: Insufficient documentation

## 2017-08-10 DIAGNOSIS — J028 Acute pharyngitis due to other specified organisms: Secondary | ICD-10-CM | POA: Diagnosis not present

## 2017-08-10 DIAGNOSIS — B9789 Other viral agents as the cause of diseases classified elsewhere: Secondary | ICD-10-CM | POA: Diagnosis not present

## 2017-08-10 MED ORDER — MAGIC MOUTHWASH W/LIDOCAINE: 5.0000 mL | Freq: Four times a day (QID) | ORAL | 0 refills | Status: DC

## 2017-08-10 NOTE — Progress Notes (Signed)
   Subjective:    Patient ID: Kevin Charles, male    DOB: 21-Oct-1960, 57 y.o.   MRN: [id number]  Sore Throat   Associated symptoms include coughing. Pertinent negatives include no ear pain or shortness of breath.  Cough  This is a new problem. The current episode started in the past 7 days ( 1 week of congestion and cough, now today sore thraot pain severe). The cough is productive of sputum. Associated symptoms include ear congestion, nasal congestion, postnasal drip and a sore throat. Pertinent negatives include no chills, ear pain, fever, myalgias, shortness of breath or wheezing. Associated symptoms comments:  Gums sore. The symptoms are aggravated by lying down. Risk factors: nonsmoker. Treatments tried:  gargle with salt water, alkaseltzer, tylenol. There is no history of asthma, COPD or environmental allergies.  Oral Pain   Pertinent negatives include no fever.     Social History /Family History/Past Medical History reviewed in detail and updated in EMR if needed. Blood pressure 120/80, pulse 63, temperature 98.4 F (36.9 C), temperature source Oral, height 5' 10.5" (1.791 m), weight 217 lb 12 oz (98.8 kg), SpO2 97 %.  Review of Systems  Constitutional: Negative for chills and fever.  HENT: Positive for postnasal drip and sore throat. Negative for ear pain.   Respiratory: Positive for cough. Negative for shortness of breath and wheezing.   Musculoskeletal: Negative for myalgias.  Allergic/Immunologic: Negative for environmental allergies.       Objective:   Physical Exam  Constitutional: Vital signs are normal. He appears well-developed and well-nourished.  Non-toxic appearance. He does not appear ill. No distress.  HENT:  Head: Normocephalic and atraumatic.  Right Ear: Hearing, tympanic membrane, external ear and ear canal normal. No tenderness. No foreign bodies. Tympanic membrane is not retracted and not bulging.  Left Ear: Hearing, tympanic membrane, external ear and  ear canal normal. No tenderness. No foreign bodies. Tympanic membrane is not retracted and not bulging.  Nose: Nose normal. No mucosal edema or rhinorrhea. Right sinus exhibits no maxillary sinus tenderness and no frontal sinus tenderness. Left sinus exhibits no maxillary sinus tenderness and no frontal sinus tenderness.  Mouth/Throat: Uvula is midline and mucous membranes are normal. Normal dentition. No dental caries. Posterior oropharyngeal erythema present. No oropharyngeal exudate or tonsillar abscesses.  Eyes: Conjunctivae, EOM and lids are normal. Pupils are equal, round, and reactive to light. Lids are everted and swept, no foreign bodies found.  Neck: Trachea normal, normal range of motion and phonation normal. Neck supple. Carotid bruit is not present. No thyroid mass and no thyromegaly present.  Cardiovascular: Normal rate, regular rhythm, S1 normal, S2 normal, normal heart sounds, intact distal pulses and normal pulses. Exam reveals no gallop.  No murmur heard. Pulmonary/Chest: Effort normal and breath sounds normal. No respiratory distress. He has no wheezes. He has no rhonchi. He has no rales.  Abdominal: Soft. Normal appearance and bowel sounds are normal. There is no hepatosplenomegaly. There is no tenderness. There is no rebound, no guarding and no CVA tenderness. No hernia.  Neurological: He is alert. He has normal reflexes.  Skin: Skin is warm, dry and intact. No rash noted.  Psychiatric: He has a normal mood and affect. His speech is normal and behavior is normal. Judgment normal.          Assessment & Plan:

## 2017-08-10 NOTE — Assessment & Plan Note (Signed)
No clear sign of bacterial infection.. Treat with symptomatic care

## 2017-08-10 NOTE — Patient Instructions (Signed)
Rest, fluids.   Can use magic mouth wash to numb throat. Tylenol as needed for pain in throat. Can use guafenesin twice daily.  Not improving in next 4-5 days.. Can to let us know.  If  shortness breath, call  Or if severe go to ER.

## 2017-08-15 ENCOUNTER — Other Ambulatory Visit: Payer: Self-pay | Admitting: Family Medicine

## 2017-08-15 NOTE — Telephone Encounter (Signed)
Copied from [place] [id number]. Topic: Quick Communication - Rx Refill/Question >> Aug 15, 2017  1:20 PM Synthia Innocent wrote: Medication: rizatriptan (MAXALT-MLT) 5 MG disintegrating tablet   Has the patient contacted their pharmacy? Yes.     (Agent: If no, request that the patient contact the pharmacy for the refill.)   Preferred Pharmacy (with phone number or street name): [facility] Fax [phone]  Agent: Please be advised that RX refills may take up to 3 business days. We ask that you follow-up with your pharmacy.

## 2017-08-16 ENCOUNTER — Telehealth: Payer: Self-pay | Admitting: Family Medicine

## 2017-08-16 NOTE — Telephone Encounter (Signed)
Copied from [place]. Topic: Quick Communication - Rx Refill/Question >> Aug 16, 2017  3:24 PM Cecelia Byars, NT wrote: Medication:  rizatriptan (MAXALT-MLT) 5 MG disintegrating tablet  Has the patient contacted their pharmacy? yes  (Agent: If no, request that the patient contact the pharmacy for the refill Preferred Pharmacy (with phone number or street name): [place], [place] - [place] [phone] (Phone) [phone] (Fax)   Agent: Please be advised that RX refills may take up to 3 business days. We ask that you follow-up with your pharmacy.

## 2017-08-16 NOTE — Telephone Encounter (Signed)
Last refill 02/23/16 #4/2

## 2017-08-16 NOTE — Telephone Encounter (Signed)
Rizatriptan (Maxalt-MLT) LOV: 02/23/16 PCP: Dr Damita Dunnings Pharmacy: [facility]

## 2017-08-17 MED ORDER — RIZATRIPTAN BENZOATE 5 MG PO TBDP: ORAL_TABLET | ORAL | 2 refills | Status: DC

## 2017-08-17 NOTE — Telephone Encounter (Signed)
Med called in by provider

## 2017-08-17 NOTE — Telephone Encounter (Signed)
Sent. Thanks.   

## 2017-08-28 ENCOUNTER — Ambulatory Visit: Payer: Medicare Other | Admitting: Cardiovascular Disease

## 2017-08-28 ENCOUNTER — Encounter: Payer: Self-pay | Admitting: Cardiovascular Disease

## 2017-08-28 VITALS — BP 140/80 | HR 74 | Ht 70.5 in | Wt 206.0 lb

## 2017-08-28 DIAGNOSIS — I10 Essential (primary) hypertension: Secondary | ICD-10-CM

## 2017-08-28 DIAGNOSIS — I2510 Atherosclerotic heart disease of native coronary artery without angina pectoris: Secondary | ICD-10-CM | POA: Diagnosis not present

## 2017-08-28 DIAGNOSIS — E785 Hyperlipidemia, unspecified: Secondary | ICD-10-CM | POA: Diagnosis not present
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
## 2017-08-28 NOTE — Patient Instructions (Signed)
Medication Instructions: Continue same medications.   Labwork: None.   Procedures/Testing: None.   Follow-Up: 1 year with Dr. Wylie Russon.   Any Additional Special Instructions Will Be Listed Below (If Applicable).     If you need a refill on your cardiac medications before your next appointment, please call your pharmacy.   

## 2017-09-18 ENCOUNTER — Ambulatory Visit (INDEPENDENT_AMBULATORY_CARE_PROVIDER_SITE_OTHER): Payer: Medicare Other | Admitting: Internal Medicine

## 2017-09-18 ENCOUNTER — Encounter: Payer: Self-pay | Admitting: Internal Medicine

## 2017-09-18 VITALS — BP 128/82 | HR 71 | Temp 97.9°F | Wt 208.0 lb

## 2017-09-18 DIAGNOSIS — R112 Nausea with vomiting, unspecified: Secondary | ICD-10-CM | POA: Diagnosis not present

## 2017-09-18 DIAGNOSIS — R6883 Chills (without fever): Secondary | ICD-10-CM | POA: Diagnosis not present

## 2017-09-18 DIAGNOSIS — R52 Pain, unspecified: Secondary | ICD-10-CM

## 2017-09-18 NOTE — Progress Notes (Signed)
Subjective:    Patient ID: Kevin Charles, male    DOB: 1960-09-23, 57 y.o.   MRN: [id number]  HPI  Pt presents to the clinic today with c/o body aches, chills, nausea and vomiting. This started 4 days ago. He reports he was nauseated and vomiting all weekend, but that has since resolved. He denies diarrhea, constipation or blood in his stool. He denies runny nose, nasal congestion, ear pain, sore throat, cough or shortness of breath. He is not sure if he was running a fever but has been having cold sweats and chills. The biggest issue he has now is the persistent body aches, which do seem to be improving. He has tried Tylenol with some relief. He denies recent changes in diet or medications. He has not had sick contacts that he is aware of.    Review of Systems      Past Medical History:  Diagnosis Date  . 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 Wallace Going  . BPH (benign prostatic hyperplasia)   . CAD (coronary artery disease)    a. cath 04/20/2014: LM: nl, mLAD 40%, LCx minor luminal irregs, pRCA 10%  . Family history of premature CAD    a. father died of MI 81-49  . GERD (gastroesophageal reflux disease)   . History of echocardiogram    a. 04/18/2014: EF 60-65%, borderline LVH, PA pressures not assessed  . Hyperlipidemia   . Hypertension   . Hypothyroidism   . Migraines   . Seizure disorder ([place]) none since 2004   [facility]    Current Outpatient Medications  Medication Sig Dispense Refill  . aspirin 81 MG tablet Take 81 mg by mouth daily.    Marland Kitchen atorvastatin (LIPITOR) 40 MG tablet Take 1 tablet (40 mg total) by mouth daily. (Patient taking differently: Take 40 mg by mouth every other day. ) 90 tablet 0  . Benzoyl Peroxide (DEL-AQUA) 10 % CREA Apply to face at night.     . chlorthalidone (HYGROTON) 25 MG tablet Take 1 tablet (25 mg total) by mouth every other day.    . divalproex (DEPAKOTE ER) 500 MG 24 hr tablet TAKE 2 TABLETS BY MOUTH  TWICE DAILY, EXCEPT  SUNDAY  AND WEDNESDAY ONLY TAKE 1  TABLET IN THE MORNING AND 2 TABLETS IN THE EVENING 360 tablet 1  . levothyroxine (SYNTHROID, LEVOTHROID) 150 MCG tablet TAKE 1 TABLET BY MOUTH ONCE A DAY EXCEPT TAKE 1 AND 1/2 TABLETS ON SUNDAY 94 tablet 1  . loratadine (CLARITIN) 10 MG tablet Take 10 mg by mouth daily as needed for allergies.    . metoprolol tartrate (LOPRESSOR) 25 MG tablet TAKE 1 TABLET BY MOUTH  DAILY 90 tablet 1  . pantoprazole (PROTONIX) 40 MG tablet TAKE 1 TABLET BY MOUTH  DAILY 90 tablet 3  . rizatriptan (MAXALT-MLT) 5 MG disintegrating tablet TAKE ONE (1) TABLET BY MOUTH AS NEEDED FOR MIGRAINE; MAY REPEAT IN 2 HOURS IF NEEDED 4 tablet 2  . SAW PALMETTO, SERENOA REPENS, PO Take 1 or 2 tablets by mouth daily.     No current facility-administered medications for this visit.     Allergies  Allergen Reactions  . Pravastatin Sodium     Aches with pravastatin but able to tolerate lipitor    Family History  Problem Relation Age of Onset  . Stroke Mother        Mini strokes  . Hyperlipidemia Mother   . Hypertension Mother   . Heart disease  Father 80       Heart Attack  . Heart attack Father   . Parkinsonism Other   . Hypertension Other   . Diabetes Other   . Heart disease Maternal Aunt        Irregular heartbeat  . Heart disease Maternal Uncle   . Prostate cancer Maternal Uncle   . Heart disease Maternal Grandfather   . Prostate cancer Cousin   . Alcohol abuse Neg Hx   . Drug abuse Neg Hx   . Colon cancer Neg Hx     Social History   Socioeconomic History  . Marital status: Single    Spouse name: Not on file  . Number of children: 0  . Years of education: Not on file  . Highest education level: Not on file  Occupational History  . 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 Financial resource strain: Not on file  . Food insecurity:    Worry: Not on file    Inability: Not on file  . Transportation needs:     Medical: Not on file    Non-medical: Not on file  Tobacco Use  . Smoking status: Former Research scientist (life sciences)  . Smokeless tobacco: Former Systems developer    Types: Chew  . Tobacco comment: quit 2009, quit smoking 1995  Substance and Sexual Activity  . Alcohol use: No    Alcohol/week: 0.0 oz  . Drug use: No  . Sexual activity: Never  Lifestyle  . Physical activity:    Days per week: Not on file    Minutes per session: Not on file  . Stress: Not on file  Relationships  . Social connections:    Talks on phone: Not on file    Gets together: Not on file    Attends religious service: Not on file    Active member of club or organization: Not on file    Attends meetings of clubs or organizations: Not on file    Relationship status: Not on file  . Intimate partner violence:    Fear of current or ex partner: Not on file    Emotionally abused: Not on file    Physically abused: Not on file    Forced sexual activity: Not on file  Other Topics Concern  . Not on file  Social History Narrative   Divorced from his 2nd marriage in 2005.   No children.   Enjoys working at home, church.   Exercises at home.   Lives alone.       Constitutional: Pt reports chills. Denies fever, malaise, fatigue, headache or abrupt weight changes.  HEENT: Denies eye pain, eye redness, ear pain, ringing in the ears, wax buildup, runny nose, nasal congestion, bloody nose, or sore throat. Respiratory: Denies difficulty breathing, shortness of breath, cough or sputum production.   Cardiovascular: Denies chest pain, chest tightness, palpitations or swelling in the hands or feet.  Gastrointestinal: Pt reports nausea and vomiting. Denies abdominal pain, bloating, constipation, diarrhea or blood in the stool.  GU: Denies urgency, frequency, pain with urination, burning sensation, blood in urine, odor or discharge. Musculoskeletal: Pt reports body aches. Denies decrease in range of motion, difficulty with gait,or joint pain and swelling.     No other specific complaints in a complete review of systems (except as listed in HPI above).  Objective:   Physical Exam  BP 128/82   Pulse 71   Temp 97.9 F (36.6 C) (Oral)  Wt 208 lb (94.3 kg)   SpO2 98%   BMI 29.42 kg/m  Wt Readings from Last 3 Encounters:  09/18/17 208 lb (94.3 kg)  08/28/17 206 lb (93.4 kg)  08/10/17 217 lb 12 oz (98.8 kg)    General: Appears his stated age, well developed, well nourished in NAD. Neck:  No adenopathy noted. Cardiovascular: Normal rate and rhythm.  Pulmonary/Chest: Normal effort and positive vesicular breath sounds. No respiratory distress. No wheezes, rales or ronchi noted.  Abdomen: Soft and nontender. Normal bowel sounds. No distention or masses noted.    BMET    Component Value Date/Time   NA 139 05/17/2017 0956   NA 143 04/18/2014 0532   K 4.3 05/17/2017 0956   K 4.6 04/18/2014 0532   CL 100 05/17/2017 0956   CL 111 (H) 04/18/2014 0532   CO2 32 05/17/2017 0956   CO2 23 04/18/2014 0532   GLUCOSE 95 05/17/2017 0956   GLUCOSE 99 04/18/2014 0532   BUN 21 05/17/2017 0956   BUN 13 04/18/2014 0532   CREATININE 0.89 05/17/2017 0956   CREATININE 0.78 04/18/2014 0532   CALCIUM 10.0 05/17/2017 0956   CALCIUM 8.6 04/18/2014 0532   GFRNONAA >60 08/16/2016 1114   GFRNONAA >60 04/18/2014 0532   GFRAA >60 08/16/2016 1114   GFRAA >60 04/18/2014 0532    Lipid Panel     Component Value Date/Time   CHOL 110 05/17/2017 0956   CHOL 149 12/30/2015 0827   CHOL 200 04/18/2014 0532   CHOL 200 04/13/2014   TRIG 80.0 05/17/2017 0956   TRIG 217 (H) 04/18/2014 0532   TRIG 217 04/13/2014   HDL 41.60 05/17/2017 0956   HDL 33 (L) 12/30/2015 0827   HDL 28 (L) 04/18/2014 0532   CHOLHDL 3 05/17/2017 0956   VLDL 16.0 05/17/2017 0956   VLDL 43 (H) 04/18/2014 0532   LDLCALC 53 05/17/2017 0956   LDLCALC 80 12/30/2015 0827   LDLCALC 129 (H) 04/18/2014 0532    CBC    Component Value Date/Time   WBC 5.2 04/20/2014 0412   WBC 4.7  03/30/2009 0851   RBC 4.86 04/20/2014 0412   RBC 5.03 03/30/2009 0851   HGB 15.9 04/20/2014 0412   HCT 46.3 04/20/2014 0412   PLT 154 04/20/2014 0412   MCV 96 04/20/2014 0412   MCH 32.8 04/20/2014 0412   MCHC 34.3 04/20/2014 0412   MCHC 34.8 03/30/2009 0851   RDW 13.0 04/20/2014 0412   LYMPHSABS 2.0 04/20/2014 0412   MONOABS 0.8 04/20/2014 0412   EOSABS 0.2 04/20/2014 0412   BASOSABS 0.1 04/20/2014 0412    Hgb A1C No results found for: HGBA1C          Assessment & Plan:   Chills, Body Aches, Nausea and Vomiting:  Most symptoms have resolved except body aches Encouraged him to get some rest and drink plenty of fluids Continue Ibuprofen, avoid NSAID's Likely viral No point in testing for flu as it is too far out for treatment  Return precautions discussed Webb Silversmith, NP

## 2017-09-18 NOTE — Patient Instructions (Signed)
Viral Illness, Adult Viruses are tiny germs that can get into a person's body and cause illness. There are many different types of viruses, and they cause many types of illness. Viral illnesses can range from mild to severe. They can affect various parts of the body. Common illnesses that are caused by a virus include colds and the flu. Viral illnesses also include serious conditions such as HIV/AIDS (human immunodeficiency virus/acquired immunodeficiency syndrome). A few viruses have been linked to certain cancers. What are the causes? Many types of viruses can cause illness. Viruses invade cells in your body, multiply, and cause the infected cells to malfunction or die. When the cell dies, it releases more of the virus. When this happens, you develop symptoms of the illness, and the virus continues to spread to other cells. If the virus takes over the function of the cell, it can cause the cell to divide and grow out of control, as is the case when a virus causes cancer. Different viruses get into the body in different ways. You can get a virus by:  Swallowing food or water that is contaminated with the virus.  Breathing in droplets that have been coughed or sneezed into the air by an infected person.  Touching a surface that has been contaminated with the virus and then touching your eyes, nose, or mouth.  Being bitten by an insect or animal that carries the virus.  Having sexual contact with a person who is infected with the virus.  Being exposed to blood or fluids that contain the virus, either through an open cut or during a transfusion.  If a virus enters your body, your body's defense system (immune system) will try to fight the virus. You may be at higher risk for a viral illness if your immune system is weak. What are the signs or symptoms? Symptoms vary depending on the type of virus and the location of the cells that it invades. Common symptoms of the main types of viral illnesses  include: Cold and flu viruses  Fever.  Headache.  Sore throat.  Muscle aches.  Nasal congestion.  Cough. Digestive system (gastrointestinal) viruses  Fever.  Abdominal pain.  Nausea.  Diarrhea. Liver viruses (hepatitis)  Loss of appetite.  Tiredness.  Yellowing of the skin (jaundice). Brain and spinal cord viruses  Fever.  Headache.  Stiff neck.  Nausea and vomiting.  Confusion or sleepiness. Skin viruses  Warts.  Itching.  Rash. Sexually transmitted viruses  Discharge.  Swelling.  Redness.  Rash. How is this treated? Viruses can be difficult to treat because they live within cells. Antibiotic medicines do not treat viruses because these drugs do not get inside cells. Treatment for a viral illness may include:  Resting and drinking plenty of fluids.  Medicines to relieve symptoms. These can include over-the-counter medicine for pain and fever, medicines for cough or congestion, and medicines to relieve diarrhea.  Antiviral medicines. These drugs are available only for certain types of viruses. They may help reduce flu symptoms if taken early. There are also many antiviral medicines for hepatitis and HIV/AIDS.  Some viral illnesses can be prevented with vaccinations. A common example is the flu shot. Follow these instructions at home: Medicines   Take over-the-counter and prescription medicines only as told by your health care provider.  If you were prescribed an antiviral medicine, take it as told by your health care provider. Do not stop taking the medicine even if you start to feel better.  Be aware   of when antibiotics are needed and when they are not needed. Antibiotics do not treat viruses. If your health care provider thinks that you may have a bacterial infection as well as a viral infection, you may get an antibiotic. ? Do not ask for an antibiotic prescription if you have been diagnosed with a viral illness. That will not make your  illness go away faster. ? Frequently taking antibiotics when they are not needed can lead to antibiotic resistance. When this develops, the medicine no longer works against the bacteria that it normally fights. General instructions  Drink enough fluids to keep your urine clear or pale yellow.  Rest as much as possible.  Return to your normal activities as told by your health care provider. Ask your health care provider what activities are safe for you.  Keep all follow-up visits as told by your health care provider. This is important. How is this prevented? Take these actions to reduce your risk of viral infection:  Eat a healthy diet and get enough rest.  Wash your hands often with soap and water. This is especially important when you are in public places. If soap and water are not available, use hand sanitizer.  Avoid close contact with friends and family who have a viral illness.  If you travel to areas where viral gastrointestinal infection is common, avoid drinking water or eating raw food.  Keep your immunizations up to date. Get a flu shot every year as told by your health care provider.  Do not share toothbrushes, nail clippers, razors, or needles with other people.  Always practice safe sex.  Contact a health care provider if:  You have symptoms of a viral illness that do not go away.  Your symptoms come back after going away.  Your symptoms get worse. Get help right away if:  You have trouble breathing.  You have a severe headache or a stiff neck.  You have severe vomiting or abdominal pain. This information is not intended to replace advice given to you by your health care provider. Make sure you discuss any questions you have with your health care provider. Document Released: 10/15/2015 Document Revised: 11/17/2015 Document Reviewed: 10/15/2015 Elsevier Interactive Patient Education  2018 Elsevier Inc.  

## 2017-09-20 ENCOUNTER — Encounter: Payer: Self-pay | Admitting: Family Medicine

## 2017-09-20 ENCOUNTER — Other Ambulatory Visit: Payer: Self-pay | Admitting: Cardiovascular Disease

## 2017-09-20 ENCOUNTER — Other Ambulatory Visit: Payer: Self-pay | Admitting: Family Medicine

## 2017-09-20 ENCOUNTER — Ambulatory Visit (INDEPENDENT_AMBULATORY_CARE_PROVIDER_SITE_OTHER): Payer: Medicare Other | Admitting: Family Medicine

## 2017-09-20 VITALS — BP 126/78 | HR 62 | Temp 98.0°F | Wt 208.0 lb

## 2017-09-20 DIAGNOSIS — R300 Dysuria: Secondary | ICD-10-CM

## 2017-09-20 DIAGNOSIS — N410 Acute prostatitis: Secondary | ICD-10-CM | POA: Diagnosis not present

## 2017-09-20 DIAGNOSIS — I10 Essential (primary) hypertension: Secondary | ICD-10-CM

## 2017-09-20 LAB — POC URINALSYSI DIPSTICK (AUTOMATED)
Bilirubin, UA: NEGATIVE
Blood, UA: NEGATIVE
GLUCOSE UA: NEGATIVE
NITRITE UA: NEGATIVE
PH UA: 6 (ref 5.0–8.0)
Protein, UA: NEGATIVE
Spec Grav, UA: >=1.03 — AB (ref 1.010–1.025)
UROBILINOGEN UA: 0.2 U/dL

## 2017-09-20 MED ORDER — CIPROFLOXACIN HCL 500 MG PO TABS: 500.0000 mg | ORAL_TABLET | Freq: Two times a day (BID) | ORAL | 0 refills | Status: DC

## 2017-09-20 NOTE — Telephone Encounter (Signed)
Please advise if ok to refill . Pt takes Atorvastatin 40 mg qod originally prescribed qd. Please advise if ok to refill Chlorthalidone.

## 2017-09-20 NOTE — Progress Notes (Signed)
He had a fever with vomiting over the weekend. The vomiting resolved.  Leg aches are better.  No fevers >100 in the last 2 days.    Urinary sx.  Started about 2-3 days ago.  Burning with urination.  Slow stream.  Slower to start urination.    Meds, vitals, and allergies reviewed.   ROS: Per HPI unless specifically indicated in ROS section   GEN: nad, alert and oriented NECK: supple w/o LA CV: rrr PULM: ctab, no inc wob ABD: soft, +bs EXT: no edema Prostate ttp with minimal pressure.

## 2017-09-20 NOTE — Telephone Encounter (Signed)
According to Dr. Tyrell Antonio last office note, the patient is taking atorvastatin every other day. OK to fill for lipitor 40 mg - take 1 tablet every other day.  OK to fill chlorthalidone as well.

## 2017-09-20 NOTE — Patient Instructions (Signed)
Drink plenty of water and start the antibiotics today.  We'll contact you with your lab report.  Take care.   If not better then let me know.  Drink plenty of water in the meantime.

## 2017-09-21 LAB — URINE CULTURE
MICRO NUMBER:: 90417875
Result:: NO GROWTH
SPECIMEN QUALITY: ADEQUATE

## 2017-09-22 NOTE — Assessment & Plan Note (Signed)
Likely diagnosis.  Discussed with patient about options.  Check urine culture.  See notes on labs.  Start Cipro.  Plan to take for extended course given his exam.  Update me as needed.  Okay for outpatient follow-up.

## 2017-11-07 DIAGNOSIS — H25813 Combined forms of age-related cataract, bilateral: Secondary | ICD-10-CM | POA: Diagnosis not present

## 2017-11-19 ENCOUNTER — Other Ambulatory Visit (INDEPENDENT_AMBULATORY_CARE_PROVIDER_SITE_OTHER): Payer: Medicare Other

## 2017-11-19 DIAGNOSIS — Z125 Encounter for screening for malignant neoplasm of prostate: Secondary | ICD-10-CM

## 2017-11-19 LAB — PSA, MEDICARE: PSA: 3.06 ng/ml (ref 0.10–4.00)

## 2017-11-21 ENCOUNTER — Other Ambulatory Visit: Payer: Self-pay | Admitting: Family Medicine

## 2017-11-21 ENCOUNTER — Telehealth: Payer: Self-pay | Admitting: Family Medicine

## 2017-11-21 DIAGNOSIS — D1800 Hemangioma unspecified site: Secondary | ICD-10-CM | POA: Diagnosis not present

## 2017-11-21 DIAGNOSIS — Z125 Encounter for screening for malignant neoplasm of prostate: Secondary | ICD-10-CM

## 2017-11-21 DIAGNOSIS — Z85828 Personal history of other malignant neoplasm of skin: Secondary | ICD-10-CM | POA: Diagnosis not present

## 2017-11-21 DIAGNOSIS — D2230 Melanocytic nevi of unspecified part of face: Secondary | ICD-10-CM | POA: Diagnosis not present

## 2017-11-21 DIAGNOSIS — L570 Actinic keratosis: Secondary | ICD-10-CM | POA: Diagnosis not present

## 2017-11-21 DIAGNOSIS — D225 Melanocytic nevi of trunk: Secondary | ICD-10-CM | POA: Diagnosis not present

## 2017-11-21 DIAGNOSIS — D229 Melanocytic nevi, unspecified: Secondary | ICD-10-CM | POA: Diagnosis not present

## 2017-11-21 NOTE — Telephone Encounter (Signed)
Copied from [place] [id number]. Topic: Quick Communication - See Telephone Encounter >> Nov 21, 2017 10:00 AM Keene Breath wrote: CRM for notification. See Telephone encounter for: 11/21/17.  Patient called to check on his PSA lab work.  If you call him back today, please call cell number.

## 2017-11-21 NOTE — Telephone Encounter (Signed)
Called patient and gave results to him and he verbalized understanding.

## 2017-11-21 NOTE — Telephone Encounter (Signed)
See result note.  Thanks!

## 2017-12-11 ENCOUNTER — Other Ambulatory Visit: Payer: Self-pay | Admitting: Family Medicine

## 2017-12-11 NOTE — Telephone Encounter (Signed)
Electronic refill request Last office visit 09/20/17 Last refill 04/18/17 #360/1 Upcoming appointment 05/28/18

## 2017-12-13 ENCOUNTER — Other Ambulatory Visit: Payer: Self-pay | Admitting: *Deleted

## 2017-12-13 MED ORDER — DIVALPROEX SODIUM ER 500 MG PO TB24: ORAL_TABLET | ORAL | 1 refills | Status: DC

## 2017-12-13 NOTE — Telephone Encounter (Signed)
Sent. Thanks.   

## 2017-12-13 NOTE — Telephone Encounter (Signed)
Faxed refill request. Divalproex Last office visit:   09/20/17 Last Filled:    360 tablet 1 12/13/2017  Please advise.

## 2018-01-25 ENCOUNTER — Encounter: Payer: Self-pay | Admitting: Gastroenterology

## 2018-04-16 ENCOUNTER — Encounter: Payer: Medicare Other | Admitting: Gastroenterology

## 2018-05-18 ENCOUNTER — Other Ambulatory Visit: Payer: Self-pay | Admitting: Family Medicine

## 2018-05-19 ENCOUNTER — Other Ambulatory Visit: Payer: Self-pay | Admitting: Family Medicine

## 2018-05-19 DIAGNOSIS — Z125 Encounter for screening for malignant neoplasm of prostate: Secondary | ICD-10-CM

## 2018-05-19 DIAGNOSIS — I10 Essential (primary) hypertension: Secondary | ICD-10-CM

## 2018-05-19 DIAGNOSIS — Z79899 Other long term (current) drug therapy: Secondary | ICD-10-CM

## 2018-05-20 ENCOUNTER — Other Ambulatory Visit: Payer: Self-pay | Admitting: *Deleted

## 2018-05-20 MED ORDER — PANTOPRAZOLE SODIUM 40 MG PO TBEC: 40.0000 mg | DELAYED_RELEASE_TABLET | Freq: Every day | ORAL | 3 refills | Status: DC

## 2018-05-24 ENCOUNTER — Ambulatory Visit (INDEPENDENT_AMBULATORY_CARE_PROVIDER_SITE_OTHER): Payer: Medicare Other

## 2018-05-24 ENCOUNTER — Ambulatory Visit: Payer: Medicare Other

## 2018-05-24 VITALS — BP 130/82 | HR 69 | Temp 98.2°F | Ht 71.0 in | Wt 213.8 lb

## 2018-05-24 DIAGNOSIS — Z79899 Other long term (current) drug therapy: Secondary | ICD-10-CM

## 2018-05-24 DIAGNOSIS — Z125 Encounter for screening for malignant neoplasm of prostate: Secondary | ICD-10-CM

## 2018-05-24 DIAGNOSIS — I10 Essential (primary) hypertension: Secondary | ICD-10-CM

## 2018-05-24 DIAGNOSIS — Z0000 Encounter for general adult medical examination without abnormal findings: Secondary | ICD-10-CM

## 2018-05-24 LAB — COMPREHENSIVE METABOLIC PANEL
ALK PHOS: 79 U/L (ref 39–117)
ALT: 40 U/L (ref 0–53)
AST: 23 U/L (ref 0–37)
Albumin: 4.5 g/dL (ref 3.5–5.2)
BILIRUBIN TOTAL: 0.8 mg/dL (ref 0.2–1.2)
BUN: 17 mg/dL (ref 6–23)
CO2: 30 mEq/L (ref 19–32)
Calcium: 10.1 mg/dL (ref 8.4–10.5)
Chloride: 100 mEq/L (ref 96–112)
Creatinine, Ser: 0.81 mg/dL (ref 0.40–1.50)
GFR: 104.05 mL/min (ref >60.00)
Glucose, Bld: 90 mg/dL (ref 70–99)
Potassium: 4.6 mEq/L (ref 3.5–5.1)
Sodium: 139 mEq/L (ref 135–145)
TOTAL PROTEIN: 7.2 g/dL (ref 6.0–8.3)

## 2018-05-24 LAB — LIPID PANEL
Cholesterol: 129 mg/dL (ref 0–200)
HDL: 36.6 mg/dL — ABNORMAL LOW (ref >39.00)
LDL Cholesterol: 64 mg/dL (ref 0–99)
NONHDL: 92.42
Total CHOL/HDL Ratio: 4
Triglycerides: 142 mg/dL (ref 0.0–149.0)
VLDL: 28.4 mg/dL (ref 0.0–40.0)

## 2018-05-24 LAB — PSA, MEDICARE: PSA: 3.46 ng/mL (ref 0.10–4.00)

## 2018-05-24 LAB — TSH: TSH: 0.3 u[IU]/mL — ABNORMAL LOW (ref 0.35–4.50)

## 2018-05-24 NOTE — Progress Notes (Signed)
PCP notes:   Health maintenance:  Colonoscopy - addressed/due to insurance, pt will complete in 2020  Abnormal screenings:   Hearing - failed  Hearing Screening   125Hz  250Hz  500Hz  1000Hz  2000Hz  3000Hz  4000Hz  6000Hz  8000Hz   Right ear:   40 40 40  0    Left ear:   40 40 0  0     Patient concerns:   None  Nurse concerns:  None  Next PCP appt:   TBD  I reviewed health advisor's note, was available for consultation on the day of service listed in this note, and agree with documentation and plan. Elsie Stain, MD.
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
## 2018-05-24 NOTE — Patient Instructions (Signed)
Kevin Charles , Thank you for taking time to come for your Medicare Wellness Visit. I appreciate your ongoing commitment to your health goals. Please review the following plan we discussed and let me know if I can assist you in the future.   These are the goals we discussed: Goals    . Follow up with Primary Care Provider     Starting 05/24/2018, I will continue to take medications as prescribed and to follow up with primary care provider as directed.     . Increase physical activity     Starting 05/24/2018, I will continue to exercise for 60 minutes 3 days per week.        This is a list of the screening recommended for you and due dates:  Health Maintenance  Topic Date Due  . Colon Cancer Screening  06/19/2019*  . Tetanus Vaccine  02/04/2022  . Flu Shot  Completed  .  Hepatitis C: One time screening is recommended by Center for Disease Control  (CDC) for  adults born from [birth year] through 1966.   Completed  . HIV Screening  Completed  *Topic was postponed. The date shown is not the original due date.   Preventive Care for Adults  A healthy lifestyle and preventive care can promote health and wellness. Preventive health guidelines for adults include the following key practices.  . A routine yearly physical is a good way to check with your health care provider about your health and preventive screening. It is a chance to share any concerns and updates on your health and to receive a thorough exam.  . Visit your dentist for a routine exam and preventive care every 6 months. Brush your teeth twice a day and floss once a day. Good oral hygiene prevents tooth decay and gum disease.  . The frequency of eye exams is based on your age, health, family medical history, use  of contact lenses, and other factors. Follow your health care provider's recommendations for frequency of eye exams.  . Eat a healthy diet. Foods like vegetables, fruits, whole grains, low-fat dairy products, and lean protein  foods contain the nutrients you need without too many calories. Decrease your intake of foods high in solid fats, added sugars, and salt. Eat the right amount of calories for you. Get information about a proper diet from your health care provider, if necessary.  . Regular physical exercise is one of the most important things you can do for your health. Most adults should get at least 150 minutes of moderate-intensity exercise (any activity that increases your heart rate and causes you to sweat) each week. In addition, most adults need muscle-strengthening exercises on 2 or more days a week.  Silver Sneakers may be a benefit available to you. To determine eligibility, you may visit the website: www.silversneakers.com or contact program at (534) 611-4262 Mon-Fri between 8AM-8PM.   . Maintain a healthy weight. The body mass index (BMI) is a screening tool to identify possible weight problems. It provides an estimate of body fat based on height and weight. Your health care provider can find your BMI and can help you achieve or maintain a healthy weight.   For adults 20 years and older: ? A BMI below 18.5 is considered underweight. ? A BMI of 18.5 to 24.9 is normal. ? A BMI of 25 to 29.9 is considered overweight. ? A BMI of 30 and above is considered obese.   . Maintain normal blood lipids and cholesterol levels by exercising  and minimizing your intake of saturated fat. Eat a balanced diet with plenty of fruit and vegetables. Blood tests for lipids and cholesterol should begin at age 35 and be repeated every 5 years. If your lipid or cholesterol levels are high, you are over 50, or you are at high risk for heart disease, you may need your cholesterol levels checked more frequently. Ongoing high lipid and cholesterol levels should be treated with medicines if diet and exercise are not working.  . If you smoke, find out from your health care provider how to quit. If you do not use tobacco, please do not  start.  . If you choose to drink alcohol, please do not consume more than 2 drinks per day. One drink is considered to be 12 ounces (355 mL) of beer, 5 ounces (148 mL) of wine, or 1.5 ounces (44 mL) of liquor.  . If you are 29-37 years old, ask your health care provider if you should take aspirin to prevent strokes.  . Use sunscreen. Apply sunscreen liberally and repeatedly throughout the day. You should seek shade when your shadow is shorter than you. Protect yourself by wearing long sleeves, pants, a wide-brimmed hat, and sunglasses year round, whenever you are outdoors.  . Once a month, do a whole body skin exam, using a mirror to look at the skin on your back. Tell your health care provider of new moles, moles that have irregular borders, moles that are larger than a pencil eraser, or moles that have changed in shape or color.

## 2018-05-25 LAB — VALPROIC ACID LEVEL: Valproic Acid Lvl: 125.5 mg/L — ABNORMAL HIGH (ref 50.0–100.0)

## 2018-05-28 ENCOUNTER — Encounter: Payer: Self-pay | Admitting: Family Medicine

## 2018-05-28 ENCOUNTER — Encounter: Payer: Medicare Other | Admitting: Family Medicine

## 2018-05-28 ENCOUNTER — Ambulatory Visit (INDEPENDENT_AMBULATORY_CARE_PROVIDER_SITE_OTHER): Payer: Medicare Other | Admitting: Family Medicine

## 2018-05-28 VITALS — BP 130/82 | HR 69 | Temp 98.2°F | Ht 71.0 in | Wt 213.8 lb

## 2018-05-28 DIAGNOSIS — G43909 Migraine, unspecified, not intractable, without status migrainosus: Secondary | ICD-10-CM | POA: Diagnosis not present

## 2018-05-28 DIAGNOSIS — Z7189 Other specified counseling: Secondary | ICD-10-CM

## 2018-05-28 DIAGNOSIS — E039 Hypothyroidism, unspecified: Secondary | ICD-10-CM | POA: Diagnosis not present

## 2018-05-28 DIAGNOSIS — K4090 Unilateral inguinal hernia, without obstruction or gangrene, not specified as recurrent: Secondary | ICD-10-CM

## 2018-05-28 DIAGNOSIS — E785 Hyperlipidemia, unspecified: Secondary | ICD-10-CM | POA: Diagnosis not present

## 2018-05-28 DIAGNOSIS — G40209 Localization-related (focal) (partial) symptomatic epilepsy and epileptic syndromes with complex partial seizures, not intractable, without status epilepticus: Secondary | ICD-10-CM

## 2018-05-28 DIAGNOSIS — I10 Essential (primary) hypertension: Secondary | ICD-10-CM

## 2018-05-28 DIAGNOSIS — Z79899 Other long term (current) drug therapy: Secondary | ICD-10-CM

## 2018-05-28 DIAGNOSIS — Z0000 Encounter for general adult medical examination without abnormal findings: Secondary | ICD-10-CM

## 2018-05-28 MED ORDER — RIZATRIPTAN BENZOATE 5 MG PO TBDP: ORAL_TABLET | ORAL | 5 refills | Status: DC

## 2018-05-28 MED ORDER — LEVOTHYROXINE SODIUM 150 MCG PO TABS: ORAL_TABLET | ORAL | 3 refills | Status: DC

## 2018-05-28 NOTE — Patient Instructions (Addendum)
Cut the thyroid medicine back to 1 tab a day.  Recheck labs in about 2 months.   Take thyroid medicine that AM but not the depakote.  Okay to take depakote after the labs are collected.   Take care.  Glad to see you.  Update me as needed.

## 2018-05-28 NOTE — Progress Notes (Signed)
Elevated Cholesterol: Using medications without problems: yes Muscle aches: no Diet compliance: yes Exercise:yes  Hypertension:    Using medication without problems or lightheadedness: yes Chest pain with exertion:no Edema:no Short of breath:no  Hypothyroidism. Compliant.  TSH slightly low.  D/w pt.  No ADE on med.    He had recent URI and last week he had a migraine.  That was a recent issue- he hasn't had migraines like that frequently.  He has previously used Maxalt with relief.  He had an episode of vertigo that resolved.   No symptoms currently.  SZ hx.  No recently SZ, none in years.  Compliant.  Labs d/w pt.  His level wasn't a trough level so that would explain the mild elevation.    He has BIH.  Recheck today, minimal sx, occ.  D/w pt.  He wanted to put off surgery if possible.  Cautions d/w pt.   Hearing - failed, d/w pt.  Declined hearing aids.   Advance directive- Jobie Quaker designated if patient were incapacitated.  Colonoscopy to be done 2020, d/w pt.    PSA still normal.  D/w pt.  tdap 2013 Flu shot 2019 Shingles and PNA not due yet.    PMH and SH reviewed  ROS: Per HPI unless specifically indicated in ROS section   Meds, vitals, and allergies reviewed.   GEN: nad, alert and oriented HEENT: mucous membranes moist NECK: supple w/o LA CV: rrr.  no murmur PULM: ctab, no inc wob ABD: soft, +bs EXT: no edema SKIN: no acute rash Not TMG on exam.  BIH noted, soft, reduced easily.

## 2018-05-30 NOTE — Assessment & Plan Note (Signed)
Routine cautions noted.  He wanted to avoid surgery if possible.  No clear indication for surgery at this point.  Update me as needed.  We can refer if needed.  He agrees.

## 2018-05-30 NOTE — Assessment & Plan Note (Addendum)
Reasonable control.  No change in meds.  Continue work on diet and exercise.  He agrees.  >25 minutes spent in face to face time with patient, >50% spent in counselling or coordination of care.

## 2018-05-30 NOTE — Assessment & Plan Note (Signed)
Hearing - failed, d/w pt.  Declined hearing aids.   Advance directive- Jobie Quaker designated if patient were incapacitated.  Colonoscopy to be done 2020, d/w pt.    PSA still normal.  D/w pt.  tdap 2013 Flu shot 2019 Shingles and PNA not due yet.

## 2018-05-30 NOTE — Assessment & Plan Note (Signed)
Decrease levothyroxine replacement to 1 tab a day.  Recheck TSH in a few months.  He agrees.

## 2018-05-30 NOTE — Assessment & Plan Note (Signed)
Continue statin.  Continue work on diet and exercise.  Update me as needed.  He agrees. 

## 2018-05-30 NOTE — Assessment & Plan Note (Signed)
Advance directive- Kevin Charles designated if patient were incapacitated.  

## 2018-05-30 NOTE — Assessment & Plan Note (Signed)
He can use Maxalt as needed.  If he continues to have frequent symptoms then he will let me know.  Okay for outpatient follow-up.

## 2018-05-30 NOTE — Assessment & Plan Note (Signed)
No recently SZ, none in years.  Compliant.  Labs d/w pt.  His level wasn't a trough level so that would explain the mild elevation.    We can recheck a trough Depakote level later on.  He agrees.

## 2018-05-31 ENCOUNTER — Telehealth: Payer: Self-pay | Admitting: Family Medicine

## 2018-05-31 NOTE — Telephone Encounter (Signed)
Pt called office stating he received a call on his cell phone and house phone. Pt was concerned, but he said he never got a voicemail.

## 2018-08-01 ENCOUNTER — Other Ambulatory Visit (INDEPENDENT_AMBULATORY_CARE_PROVIDER_SITE_OTHER): Payer: Medicare Other

## 2018-08-01 DIAGNOSIS — E039 Hypothyroidism, unspecified: Secondary | ICD-10-CM

## 2018-08-01 DIAGNOSIS — Z79899 Other long term (current) drug therapy: Secondary | ICD-10-CM | POA: Diagnosis not present

## 2018-08-01 LAB — TSH: TSH: 0.54 u[IU]/mL (ref 0.35–4.50)

## 2018-08-02 LAB — VALPROIC ACID LEVEL: Valproic Acid Lvl: 98.5 mg/L (ref 50.0–100.0)

## 2018-08-06 ENCOUNTER — Telehealth: Payer: Self-pay

## 2018-08-06 NOTE — Telephone Encounter (Signed)
Pt request cb with lab results from 08/01/18.

## 2018-08-06 NOTE — Telephone Encounter (Signed)
See notes on labs. Thanks.   

## 2018-08-23 ENCOUNTER — Encounter: Payer: Self-pay | Admitting: Gastroenterology

## 2018-08-24 ENCOUNTER — Other Ambulatory Visit: Payer: Self-pay | Admitting: Family Medicine

## 2018-08-26 ENCOUNTER — Telehealth: Payer: Self-pay | Admitting: Cardiovascular Disease

## 2018-08-26 DIAGNOSIS — I10 Essential (primary) hypertension: Secondary | ICD-10-CM

## 2018-08-26 MED ORDER — CHLORTHALIDONE 25 MG PO TABS: 25.0000 mg | ORAL_TABLET | ORAL | 0 refills | Status: DC

## 2018-08-26 MED ORDER — ATORVASTATIN CALCIUM 40 MG PO TABS: 40.0000 mg | ORAL_TABLET | Freq: Every day | ORAL | 0 refills | Status: DC

## 2018-08-26 NOTE — Telephone Encounter (Signed)
Atorvastatin 40 mg daily and chlorthalidone 25 mg every other day is fine

## 2018-08-26 NOTE — Telephone Encounter (Signed)
Refill sent to [facility]. Atorvastatin 40mg  daily #90 R-0 Chlorthalidone 25mg  every other day #45 R-0.

## 2018-08-26 NOTE — Telephone Encounter (Signed)
Spoke with the pt. Pt sts that he noticed that there are 2 discrepancies ion his medication list. He had not noticed it until recently. He contacted [facility] an was instructed to contact his Cardiologist office for  [place] to send in new prescriptions. Pt sts that he is taking Atorvastatin 40mg  daily. There are 2 sigs associated with the medication. Adv the pt that Dr.Arida documented that he was taking Atorvastatin 40mg  every other day due to myalgia at his March 2019 o/v. He is taking his Chlorthalidone 25mg  every other day. Adv him that we have it listed the same.  I asked the pt to confirm that Dr.Arida was the physician listed on his bottle, since I was unable to fine documentation of the last refills. Pt confirms that Dr.Arida was listed and that last refill was auth on 07/23/18.  Adv the pt that he is due for f/u with Dr.Arida. pt yearly appt scheduled with Dr.Arida for 10/31/18 @ 8:40am. Pt aware of the appt date, time, and location. Adv pt since Dr.rida has he Atorvastatin documented differently I will need to send him an update for approval. Adv the pt that once we receive the ok from [place] We can refill the Atorvastatin for 40mg  daily and Chlorthalidone for 25mg  every other day. Both Rx to be sent to [facility]. Pt verbalized understanding and voiced appreciation for the assistance.

## 2018-08-26 NOTE — Telephone Encounter (Signed)
Pt asks that we call before 11 am.

## 2018-08-26 NOTE — Telephone Encounter (Signed)
Please call to discuss his Atorvastatin. States he has a question regarding the label that is on his medicine. Also he has a question regarding Chlorthalidone doseage, he also has a question regarding the labeling. States he called [facility] and they asked [place] to call in new rx.

## 2018-08-27 ENCOUNTER — Other Ambulatory Visit: Payer: Self-pay | Admitting: Cardiovascular Disease

## 2018-09-24 ENCOUNTER — Encounter: Payer: Medicare Other | Admitting: Gastroenterology

## 2018-09-28 ENCOUNTER — Other Ambulatory Visit: Payer: Self-pay | Admitting: Family Medicine

## 2018-10-27 ENCOUNTER — Other Ambulatory Visit: Payer: Self-pay | Admitting: Cardiovascular Disease

## 2018-10-28 ENCOUNTER — Telehealth: Payer: Self-pay

## 2018-10-28 NOTE — Telephone Encounter (Signed)
Virtual Visit Pre-Appointment Phone Call  "(Name), I am calling you today to discuss your upcoming appointment. We are currently trying to limit exposure to the virus that causes COVID-19 by seeing patients at home rather than in the office."  1. "What is the BEST phone number to call the day of the visit?" - include this in appointment notes  2. "Do you have or have access to (through a family member/friend) a smartphone with video capability that we can use for your visit?" a. If yes - list this number in appt notes as "cell" (if different from BEST phone #) and list the appointment type as a VIDEO visit in appointment notes b. If no - list the appointment type as a PHONE visit in appointment notes  3. Confirm consent - "In the setting of the current Covid19 crisis, you are scheduled for a (phone or video) visit with your provider on (date) at (time).  Just as we do with many in-office visits, in order for you to participate in this visit, we must obtain consent.  If you'd like, I can send this to your mychart (if signed up) or email for you to review.  Otherwise, I can obtain your verbal consent now.  All virtual visits are billed to your insurance company just like a normal visit would be.  By agreeing to a virtual visit, we'd like you to understand that the technology does not allow for your provider to perform an examination, and thus may limit your provider's ability to fully assess your condition. If your provider identifies any concerns that need to be evaluated in person, we will make arrangements to do so.  Finally, though the technology is pretty good, we cannot assure that it will always work on either your or our end, and in the setting of a video visit, we may have to convert it to a phone-only visit.  In either situation, we cannot ensure that we have a secure connection.  Are you willing to proceed?" STAFF: Did the patient verbally acknowledge consent to telehealth visit? Document  YES/NO here: YES  4.   5. Advise patient to be prepared - "Two hours prior to your appointment, go ahead and check your blood pressure, pulse, oxygen saturation, and your weight (if you have the equipment to check those) and write them all down. When your visit starts, your provider will ask you for this information. If you have an Apple Watch or Kardia device, please plan to have heart rate information ready on the day of your appointment. Please have a pen and paper handy nearby the day of the visit as well."  6. Give patient instructions for MyChart download to smartphone OR Doximity/Doxy.me as below if video visit (depending on what platform provider is using)  7. Inform patient they will receive a phone call 15 minutes prior to their appointment time (may be from unknown caller ID) so they should be prepared to answer    TELEPHONE CALL NOTE  Kevin Charles has been deemed a candidate for a follow-up tele-health visit to limit community exposure during the Covid-19 pandemic. I spoke with the patient via phone to ensure availability of phone/video source, confirm preferred email & phone number, and discuss instructions and expectations.  I reminded Kevin Charles to be prepared with any vital sign and/or heart rhythm information that could potentially be obtained via home monitoring, at the time of his visit. I reminded Kevin Charles to expect a phone  call prior to his visit.  Dolores Lory, [facility] 10/28/2018 10:47 AM   INSTRUCTIONS FOR DOWNLOADING THE MYCHART APP TO SMARTPHONE  - The patient must first make sure to have activated MyChart and know their login information - If Apple, go to [employer] and type in MyChart in the search bar and download the app. If Android, ask patient to go to [employer] and type in [place] in the search bar and download the app. The app is free but as with any other app downloads, their phone may require them to verify saved payment information or  Apple/Android password.  - The patient will need to then log into the app with their MyChart username and password, and select [facility] as their healthcare provider to link the account. When it is time for your visit, go to the MyChart app, find appointments, and click Begin Video Visit. Be sure to Select Allow for your device to access the Microphone and Camera for your visit. You will then be connected, and your provider will be with you shortly.  **If they have any issues connecting, or need assistance please contact MyChart service desk [phone])**  **If using a computer, in order to ensure the best quality for their visit they will need to use either of the following Internet Browsers: [employer], or Google Chrome**  IF USING DOXIMITY or DOXY.ME - The patient will receive a link just prior to their visit by text.     FULL LENGTH CONSENT FOR TELE-HEALTH VISIT   I hereby voluntarily request, consent and authorize [facility] and its employed or contracted physicians, physician assistants, nurse practitioners or other licensed health care professionals (the Practitioner), to provide me with telemedicine health care services (the "Services") as deemed necessary by the treating Practitioner. I acknowledge and consent to receive the Services by the Practitioner via telemedicine. I understand that the telemedicine visit will involve communicating with the Practitioner through live audiovisual communication technology and the disclosure of certain medical information by electronic transmission. I acknowledge that I have been given the opportunity to request an in-person assessment or other available alternative prior to the telemedicine visit and am voluntarily participating in the telemedicine visit.  I understand that I have the right to withhold or withdraw my consent to the use of telemedicine in the course of my care at any time, without affecting my right to future care  or treatment, and that the Practitioner or I may terminate the telemedicine visit at any time. I understand that I have the right to inspect all information obtained and/or recorded in the course of the telemedicine visit and may receive copies of available information for a reasonable fee.  I understand that some of the potential risks of receiving the Services via telemedicine include:  Marland Kitchen Delay or interruption in medical evaluation due to technological equipment failure or disruption; . Information transmitted may not be sufficient (e.g. poor resolution of images) to allow for appropriate medical decision making by the Practitioner; and/or  . In rare instances, security protocols could fail, causing a breach of personal health information.  Furthermore, I acknowledge that it is my responsibility to provide information about my medical history, conditions and care that is complete and accurate to the best of my ability. I acknowledge that Practitioner's advice, recommendations, and/or decision may be based on factors not within their control, such as incomplete or inaccurate data provided by me or distortions of diagnostic images or specimens that may result from  electronic transmissions. I understand that the practice of medicine is not an exact science and that Practitioner makes no warranties or guarantees regarding treatment outcomes. I acknowledge that I will receive a copy of this consent concurrently upon execution via email to the email address I last provided but may also request a printed copy by calling the office of [facility].    I understand that my insurance will be billed for this visit.   I have read or had this consent read to me. . I understand the contents of this consent, which adequately explains the benefits and risks of the Services being provided via telemedicine.  . I have been provided ample opportunity to ask questions regarding this consent and the Services and have had  my questions answered to my satisfaction. . I give my informed consent for the services to be provided through the use of telemedicine in my medical care  By participating in this telemedicine visit I agree to the above.

## 2018-10-29 ENCOUNTER — Telehealth: Payer: Self-pay | Admitting: Cardiovascular Disease

## 2018-10-29 NOTE — Telephone Encounter (Signed)
Please ignore msg.  Patient busy tomorrow and doesn't want a call. He thinks it will work now and will change to phone only as discussed if not .

## 2018-10-29 NOTE — Telephone Encounter (Signed)
Patient wants to test  Virtual with [name] for upcoming visit.  Please call.

## 2018-10-31 ENCOUNTER — Encounter: Payer: Self-pay | Admitting: Cardiovascular Disease

## 2018-10-31 ENCOUNTER — Other Ambulatory Visit: Payer: Self-pay

## 2018-10-31 ENCOUNTER — Telehealth (INDEPENDENT_AMBULATORY_CARE_PROVIDER_SITE_OTHER): Payer: Medicare Other | Admitting: Cardiovascular Disease

## 2018-10-31 VITALS — BP 126/82 | HR 57 | Ht 71.0 in | Wt 213.4 lb

## 2018-10-31 DIAGNOSIS — I2510 Atherosclerotic heart disease of native coronary artery without angina pectoris: Secondary | ICD-10-CM | POA: Diagnosis not present

## 2018-10-31 NOTE — Patient Instructions (Signed)
Medication Instructions:  Continue same medications If you need a refill on your cardiac medications before your next appointment, please call your pharmacy.   Lab work: None If you have labs (blood work) drawn today and your tests are completely normal, you will receive your results only by: . MyChart Message (if you have MyChart) OR . A paper copy in the mail If you have any lab test that is abnormal or we need to change your treatment, we will call you to review the results.  Testing/Procedures: None  Follow-Up: At [facility], you and your health needs are our priority.  As part of our continuing mission to provide you with exceptional heart care, we have created designated Provider Care Teams.  These Care Teams include your primary Cardiologist (physician) and Advanced Practice Providers (APPs -  Physician Assistants and Nurse Practitioners) who all work together to provide you with the care you need, when you need it. You will need a follow up appointment in 6 months.  Please call our office 2 months in advance to schedule this appointment.  You may see Maceo Hernan, MD or one of the following Advanced Practice Providers on your designated Care Team:   Christopher Berge, NP Ryan Dunn, PA-C . Jacquelyn Visser, PA-C   
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
## 2018-11-17 ENCOUNTER — Other Ambulatory Visit: Payer: Self-pay | Admitting: Cardiovascular Disease

## 2018-11-17 DIAGNOSIS — I10 Essential (primary) hypertension: Secondary | ICD-10-CM

## 2018-11-27 DIAGNOSIS — L820 Inflamed seborrheic keratosis: Secondary | ICD-10-CM | POA: Diagnosis not present

## 2018-11-27 DIAGNOSIS — Z1283 Encounter for screening for malignant neoplasm of skin: Secondary | ICD-10-CM | POA: Diagnosis not present

## 2018-11-27 DIAGNOSIS — L578 Other skin changes due to chronic exposure to nonionizing radiation: Secondary | ICD-10-CM | POA: Diagnosis not present

## 2018-11-27 DIAGNOSIS — L700 Acne vulgaris: Secondary | ICD-10-CM | POA: Diagnosis not present

## 2018-11-27 DIAGNOSIS — L570 Actinic keratosis: Secondary | ICD-10-CM | POA: Diagnosis not present

## 2018-11-27 DIAGNOSIS — Z85828 Personal history of other malignant neoplasm of skin: Secondary | ICD-10-CM | POA: Diagnosis not present

## 2019-01-07 ENCOUNTER — Other Ambulatory Visit: Payer: Self-pay | Admitting: Cardiovascular Disease

## 2019-01-12 ENCOUNTER — Emergency Department
Admission: EM | Admit: 2019-01-12 | Discharge: 2019-01-12 | Disposition: A | Discharge status: Home / Self Care | Payer: Medicare Other | Attending: Emergency Medicine | Admitting: Emergency Medicine

## 2019-01-12 ENCOUNTER — Encounter: Payer: Self-pay | Admitting: Emergency Medicine

## 2019-01-12 ENCOUNTER — Other Ambulatory Visit: Payer: Self-pay

## 2019-01-12 DIAGNOSIS — Z87891 Personal history of nicotine dependence: Secondary | ICD-10-CM | POA: Insufficient documentation

## 2019-01-12 DIAGNOSIS — I2510 Atherosclerotic heart disease of native coronary artery without angina pectoris: Secondary | ICD-10-CM | POA: Diagnosis not present

## 2019-01-12 DIAGNOSIS — N3090 Cystitis, unspecified without hematuria: Secondary | ICD-10-CM | POA: Insufficient documentation

## 2019-01-12 DIAGNOSIS — E039 Hypothyroidism, unspecified: Secondary | ICD-10-CM | POA: Insufficient documentation

## 2019-01-12 DIAGNOSIS — I10 Essential (primary) hypertension: Secondary | ICD-10-CM | POA: Insufficient documentation

## 2019-01-12 DIAGNOSIS — N390 Urinary tract infection, site not specified: Secondary | ICD-10-CM | POA: Diagnosis not present

## 2019-01-12 DIAGNOSIS — R300 Dysuria: Secondary | ICD-10-CM | POA: Diagnosis present

## 2019-01-12 LAB — CBC
HCT: 45.7 % (ref 39.0–52.0)
Hemoglobin: 15.9 g/dL (ref 13.0–17.0)
MCH: 32.6 pg (ref 26.0–34.0)
MCHC: 34.8 g/dL (ref 30.0–36.0)
MCV: 93.8 fL (ref 80.0–100.0)
Platelets: 143 10*3/uL — ABNORMAL LOW (ref 150–400)
RBC: 4.87 MIL/uL (ref 4.22–5.81)
RDW: 12.1 % (ref 11.5–15.5)
WBC: 13.7 10*3/uL — ABNORMAL HIGH (ref 4.0–10.5)
nRBC: 0 % (ref 0.0–0.2)

## 2019-01-12 LAB — URINALYSIS, COMPLETE (UACMP) WITH MICROSCOPIC
Bacteria, UA: NONE SEEN
Bilirubin Urine: NEGATIVE
Glucose, UA: NEGATIVE mg/dL
Ketones, ur: 20 mg/dL — AB
Nitrite: NEGATIVE
Protein, ur: 100 mg/dL — AB
RBC / HPF: >50 RBC/hpf — ABNORMAL HIGH (ref 0–5)
Specific Gravity, Urine: 1.027 (ref 1.005–1.030)
WBC, UA: >50 WBC/hpf — ABNORMAL HIGH (ref 0–5)
pH: 6 (ref 5.0–8.0)

## 2019-01-12 LAB — BASIC METABOLIC PANEL
Anion gap: 8 (ref 5–15)
BUN: 21 mg/dL — ABNORMAL HIGH (ref 6–20)
CO2: 28 mmol/L (ref 22–32)
Calcium: 9.4 mg/dL (ref 8.9–10.3)
Chloride: 104 mmol/L (ref 98–111)
Creatinine, Ser: 0.87 mg/dL (ref 0.61–1.24)
GFR calc Af Amer: >60 mL/min (ref >60)
GFR calc non Af Amer: >60 mL/min (ref >60)
Glucose, Bld: 107 mg/dL — ABNORMAL HIGH (ref 70–99)
Potassium: 4.1 mmol/L (ref 3.5–5.1)
Sodium: 140 mmol/L (ref 135–145)

## 2019-01-12 LAB — LACTIC ACID, PLASMA: Lactic Acid, Venous: 1.6 mmol/L (ref 0.5–1.9)

## 2019-01-12 MED ORDER — CIPROFLOXACIN HCL 500 MG PO TABS: 500.0000 mg | ORAL_TABLET | Freq: Two times a day (BID) | ORAL | 0 refills | Status: AC

## 2019-01-12 MED ORDER — SODIUM CHLORIDE 0.9 % IV SOLN
1.0000 g | Freq: Once | INTRAVENOUS | Status: AC
  Administered 2019-01-12: 19:00:00 1 g via INTRAVENOUS
  Filled 2019-01-12: qty 10

## 2019-01-12 MED ORDER — ONDANSETRON HCL 4 MG/2ML IJ SOLN
4.0000 mg | Freq: Once | INTRAMUSCULAR | Status: AC
  Administered 2019-01-12: 20:00:00 4 mg via INTRAVENOUS
  Filled 2019-01-12: qty 2

## 2019-01-12 MED ORDER — SODIUM CHLORIDE 0.9 % IV SOLN
Freq: Once | INTRAVENOUS | Status: AC
  Administered 2019-01-12: 18:00:00 1000 mL via INTRAVENOUS

## 2019-01-12 MED ORDER — KETOROLAC TROMETHAMINE 30 MG/ML IJ SOLN
15.0000 mg | Freq: Once | INTRAMUSCULAR | Status: AC
  Administered 2019-01-12: 20:00:00 15 mg via INTRAVENOUS
  Filled 2019-01-12: qty 1

## 2019-01-12 NOTE — ED Triage Notes (Signed)
Pt presents to ED via POV with c/o "feeling bad" since Friday. Pt states has not checked his temp, however has been taking Tylenol and Ibuprofen since Friday due to feeling like he has a fever. Pt c/o generalized body aches, and dysuria and frequency and urgency. Pt also c/o feeling nauseated after eating today.
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
## 2019-01-13 ENCOUNTER — Telehealth: Payer: Self-pay

## 2019-01-13 LAB — URINE CULTURE
Culture: <10000 — AB
Special Requests: NORMAL

## 2019-01-13 NOTE — Telephone Encounter (Signed)
Patient seen in the ER yesterday for cystitis.  Is on treatment, just needs to be called back to schedule ER follow up with Dr. Damita Dunnings.  FYI to Bridgett to please call patient and schedule.   Thank you!

## 2019-01-13 NOTE — Telephone Encounter (Signed)
Pt is already scheduled for 01/14/19 @ 11:30.

## 2019-01-13 NOTE — Telephone Encounter (Signed)
[place] Night - Client Nonclinical Telephone Record AccessNurse Client [place] Night - Client Client Site [facility] Physician Renford Dills - MD Contact Type Call Who Is Calling Patient / Member / Family / Caregiver Caller Name Burtis Imhoff Caller Phone Number [phone] home or [phone] cell Patient Name Kevin Charles Patient DOB [birth year] Call Type Message Only Information Provided Reason for Call Request to Schedule Office Appointment Initial Comment Caller states that he has a UTI and a prescription for Cipro. He needs to make a follow up appointment. Declined triage.

## 2019-01-14 ENCOUNTER — Ambulatory Visit (INDEPENDENT_AMBULATORY_CARE_PROVIDER_SITE_OTHER): Payer: Medicare Other | Admitting: Family Medicine

## 2019-01-14 DIAGNOSIS — R300 Dysuria: Secondary | ICD-10-CM | POA: Diagnosis not present

## 2019-01-14 NOTE — Progress Notes (Signed)
Interactive audio and video telecommunications were attempted between this provider and patient, however failed, due to patient having technical difficulties OR patient did not have access to video capability.  We continued and completed visit with audio only.   Virtual Visit via Telephone Note  I connected with patient on 01/14/19  at 12:03 PM  by telephone and verified that I am speaking with the correct person using two identifiers.  Location of patient: home.   Location of MD: [facility] Name of referring provider (if blank then none associated): Names per persons and role in encounter:  MD: Earlyne Iba, Patient: name listed above.    I discussed the limitations, risks, security and privacy concerns of performing an evaluation and management service by telephone and the availability of in person appointments. I also discussed with the patient that there may be a patient responsible charge related to this service. The patient expressed understanding and agreed to proceed.  CC ER f/u.   History of Present Illness: he was clearly feeling worse than normal and had burning with urination. He went to ER, ER course d/w pt.  Still on abx.  Ucx and Bcx neg.  D/w pt.  He clearly feels better in the meantime.  Still on antibiotics currently.  No adverse effect on medication.  Pandemic considerations d/w pt.     Observations/Objective: nad Speech wnl.   Assessment and Plan: Cystitis.  Improved in the meantime.  Continue antibiotics. recheck labs in 2 weeks, d/w pt.  He'll update me as needed.  See orders.  Follow Up Instructions: See above   I discussed the assessment and treatment plan with the patient. The patient was provided an opportunity to ask questions and all were answered. The patient agreed with the plan and demonstrated an understanding of the instructions.   The patient was advised to call back or seek an in-person evaluation if the symptoms worsen or if the condition  fails to improve as anticipated.  I provided 21 minutes of non-face-to-face time during this encounter.   Elsie Stain, MD

## 2019-01-14 NOTE — Telephone Encounter (Signed)
Noted. Thanks.

## 2019-01-17 LAB — CULTURE, BLOOD (ROUTINE X 2)
Culture: NO GROWTH
Culture: NO GROWTH

## 2019-01-28 ENCOUNTER — Other Ambulatory Visit: Payer: Self-pay

## 2019-01-28 ENCOUNTER — Other Ambulatory Visit (INDEPENDENT_AMBULATORY_CARE_PROVIDER_SITE_OTHER): Payer: Medicare Other

## 2019-01-28 DIAGNOSIS — R300 Dysuria: Secondary | ICD-10-CM | POA: Diagnosis not present

## 2019-01-28 LAB — CBC WITH DIFFERENTIAL/PLATELET
Basophils Absolute: 0.1 10*3/uL (ref 0.0–0.1)
Basophils Relative: 1.5 % (ref 0.0–3.0)
Eosinophils Absolute: 0.2 10*3/uL (ref 0.0–0.7)
Eosinophils Relative: 3 % (ref 0.0–5.0)
HCT: 45.8 % (ref 39.0–52.0)
Hemoglobin: 15.7 g/dL (ref 13.0–17.0)
Lymphocytes Relative: 32 % (ref 12.0–46.0)
Lymphs Abs: 1.9 10*3/uL (ref 0.7–4.0)
MCHC: 34.3 g/dL (ref 30.0–36.0)
MCV: 96.3 fl (ref 78.0–100.0)
Monocytes Absolute: 0.6 10*3/uL (ref 0.1–1.0)
Monocytes Relative: 10.5 % (ref 3.0–12.0)
Neutro Abs: 3.2 10*3/uL (ref 1.4–7.7)
Neutrophils Relative %: 53 % (ref 43.0–77.0)
Platelets: 209 10*3/uL (ref 150.0–400.0)
RBC: 4.76 Mil/uL (ref 4.22–5.81)
RDW: 12.8 % (ref 11.5–15.5)
WBC: 6 10*3/uL (ref 4.0–10.5)

## 2019-01-28 LAB — URINALYSIS, ROUTINE W REFLEX MICROSCOPIC
Bilirubin Urine: NEGATIVE
Hgb urine dipstick: NEGATIVE
Leukocytes,Ua: NEGATIVE
Nitrite: NEGATIVE
RBC / HPF: NONE SEEN (ref 0–?)
Specific Gravity, Urine: 1.025 (ref 1.000–1.030)
Total Protein, Urine: NEGATIVE
Urine Glucose: NEGATIVE
Urobilinogen, UA: 0.2 (ref 0.0–1.0)
pH: 7 (ref 5.0–8.0)

## 2019-01-30 ENCOUNTER — Telehealth: Payer: Self-pay

## 2019-01-30 NOTE — Telephone Encounter (Signed)
Copied from [place] [id number]. Topic: General - Call Back - No Documentation >> Jan 30, 2019 11:21 AM Erick Blinks wrote: Pt called requesting nurse call back to discuss Labs. Please advise  [phone]

## 2019-01-30 NOTE — Telephone Encounter (Signed)
Spoke with patient. Lab results are not dictated yet. Sending for review to Dr Damita Dunnings.

## 2019-01-31 NOTE — Telephone Encounter (Signed)
See result note.  

## 2019-02-05 ENCOUNTER — Other Ambulatory Visit: Payer: Self-pay | Admitting: Family Medicine

## 2019-02-05 MED ORDER — DIVALPROEX SODIUM ER 500 MG PO TB24: ORAL_TABLET | ORAL | 0 refills | Status: DC

## 2019-02-05 NOTE — Telephone Encounter (Signed)
Pulled down medication for review. Put for 90 days-please check on math for 90 day supply. Please review. Please review for Dr Damita Dunnings, needs it filled today to get in on time from [facility]

## 2019-02-05 NOTE — Telephone Encounter (Signed)
I sent a 3 mo supply

## 2019-02-05 NOTE — Telephone Encounter (Signed)
Patient called [facility] for a refill on Divalproex and they told him they had a rx for 12 pills or days and patient said they were going to charge him co-pay.  Patient needs a rx for 3 month supply called in to the pharmacy..  Patent has about 9 days of pills left.  [facility] said if they get the rx today they could get the medication to patient in 5-6 days. Please call patient when refill is sent in to pharmacy.

## 2019-02-05 NOTE — Telephone Encounter (Signed)
Patient called back to make sure this was sent back to provider. He would like a call back once this has been taken care of   Call Back # [phone]

## 2019-02-05 NOTE — Telephone Encounter (Signed)
Pt notified Rx sent 

## 2019-02-06 NOTE — Telephone Encounter (Signed)
Thanks for addressing this.

## 2019-02-11 ENCOUNTER — Other Ambulatory Visit: Payer: Self-pay | Admitting: Family Medicine

## 2019-02-25 ENCOUNTER — Telehealth: Payer: Self-pay | Admitting: Family Medicine

## 2019-02-25 NOTE — Telephone Encounter (Signed)
Patient called and said he had a phone visit with Dr.Duncan on 01/14/19. Patient said he spoke to [employer] and they told him the code for the visit needed to be changed to audio/visual telehealth visit. Patient isn't entitled to his $15 gift card until the code is changed. If there are any questions, call [facility] phone number  [phone].

## 2019-02-25 NOTE — Telephone Encounter (Signed)
Patient notified

## 2019-02-25 NOTE — Telephone Encounter (Signed)
I can't change the note.  It is documented as a phone visit due to video fail.  If the billing can be changed on the back end (in some way that is accurate), then I support that.   Thanks.

## 2019-02-25 NOTE — Telephone Encounter (Signed)
I reviewed with Aron Baba and she advised visit was billed as a telephone visit.  This is billed correct according to [facility] Medicare guidelines for telehealth.  Therefore we can not change the coding.  I called patient to explain but had to leave a voicemail message.  Please advise patient of this information.

## 2019-03-26 ENCOUNTER — Telehealth: Payer: Self-pay | Admitting: Family Medicine

## 2019-05-01 ENCOUNTER — Other Ambulatory Visit: Payer: Self-pay

## 2019-05-01 ENCOUNTER — Ambulatory Visit (INDEPENDENT_AMBULATORY_CARE_PROVIDER_SITE_OTHER): Payer: Medicare Other | Admitting: Cardiovascular Disease

## 2019-05-01 ENCOUNTER — Encounter: Payer: Self-pay | Admitting: Cardiovascular Disease

## 2019-05-01 VITALS — BP 142/80 | HR 63 | Temp 97.3°F | Ht 71.0 in | Wt 222.0 lb

## 2019-05-01 DIAGNOSIS — E785 Hyperlipidemia, unspecified: Secondary | ICD-10-CM | POA: Diagnosis not present

## 2019-05-01 DIAGNOSIS — I10 Essential (primary) hypertension: Secondary | ICD-10-CM | POA: Diagnosis not present

## 2019-05-01 DIAGNOSIS — I2510 Atherosclerotic heart disease of native coronary artery without angina pectoris: Secondary | ICD-10-CM
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
## 2019-05-01 NOTE — Patient Instructions (Addendum)
Medication Instructions:  Your physician recommends that you continue on your current medications as directed. Please refer to the Current Medication list given to you today.  *If you need a refill on your cardiac medications before your next appointment, please call your pharmacy*  Lab Work: None ordered If you have labs (blood work) drawn today and your tests are completely normal, you will receive your results only by: Marland Kitchen MyChart Message (if you have MyChart) OR . A paper copy in the mail If you have any lab test that is abnormal or we need to change your treatment, we will call you to review the results.  Testing/Procedures: None ordered  Follow-Up: At [facility], you and your health needs are our priority.  As part of our continuing mission to provide you with exceptional heart care, we have created designated Provider Care Teams.  These Care Teams include your primary Cardiologist (physician) and Advanced Practice Providers (APPs -  Physician Assistants and Nurse Practitioners) who all work together to provide you with the care you need, when you need it.  Your next appointment:   12 months  The format for your next appointment:   In Person  Provider:    You may see Kathlyn Sacramento, MD or one of the following Advanced Practice Providers on your designated Care Team:    Murray Hodgkins, NP  Christell Faith, PA-C  Marrianne Mood, PA-C   Other Instructions N/A

## 2019-05-02 ENCOUNTER — Other Ambulatory Visit: Payer: Self-pay | Admitting: Family Medicine

## 2019-05-02 ENCOUNTER — Encounter: Payer: Self-pay | Admitting: Family Medicine

## 2019-05-02 MED ORDER — DIVALPROEX SODIUM ER 500 MG PO TB24: ORAL_TABLET | ORAL | 0 refills | Status: DC

## 2019-05-02 NOTE — Telephone Encounter (Signed)
Pt called checking on his rx.  He stated he has an appointment with dr Damita Dunnings 05/29/2019.  He will be running out of his meds prior to appointment.  Can you refill until appointment.  Pt has approx  30 pills left   Please advise pt when this has been called in.  [phone] Can leave message  [facility] Phone # [phone]

## 2019-05-02 NOTE — Telephone Encounter (Signed)
Patient advised. Rx sent. 

## 2019-05-02 NOTE — Addendum Note (Signed)
Addended by: Josetta Huddle on: 05/02/2019 12:31 PM   Modules accepted: Orders

## 2019-05-12 DIAGNOSIS — L720 Epidermal cyst: Secondary | ICD-10-CM | POA: Diagnosis not present

## 2019-05-12 DIAGNOSIS — L820 Inflamed seborrheic keratosis: Secondary | ICD-10-CM | POA: Diagnosis not present

## 2019-05-12 DIAGNOSIS — L570 Actinic keratosis: Secondary | ICD-10-CM | POA: Diagnosis not present

## 2019-05-12 DIAGNOSIS — L578 Other skin changes due to chronic exposure to nonionizing radiation: Secondary | ICD-10-CM | POA: Diagnosis not present

## 2019-05-20 DIAGNOSIS — H25813 Combined forms of age-related cataract, bilateral: Secondary | ICD-10-CM | POA: Diagnosis not present

## 2019-05-28 ENCOUNTER — Other Ambulatory Visit: Payer: Self-pay

## 2019-05-28 ENCOUNTER — Ambulatory Visit (INDEPENDENT_AMBULATORY_CARE_PROVIDER_SITE_OTHER): Payer: Medicare Other

## 2019-05-28 DIAGNOSIS — Z0000 Encounter for general adult medical examination without abnormal findings: Secondary | ICD-10-CM
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
## 2019-05-28 NOTE — Patient Instructions (Signed)
Kevin Charles , Thank you for taking time to come for your Medicare Wellness Visit. I appreciate your ongoing commitment to your health goals. Please review the following plan we discussed and let me know if I can assist you in the future.   Screening recommendations/referrals: Colonoscopy: declined Recommended yearly ophthalmology/optometry visit for glaucoma screening and checkup Recommended yearly dental visit for hygiene and checkup  Vaccinations: Influenza vaccine: Up to date, completed 02/20/2019 Pneumococcal vaccine: at age 50 Tdap vaccine: Up to date, completed 02/05/2012 Shingles vaccine: will check with insurance     Advanced directives: copy given to provider per patient   Conditions/risks identified: hypertension, hyperlipidemia  Next appointment: 05/29/2019 @ 10:45 am   Preventive Care 40-64 Years, Male Preventive care refers to lifestyle choices and visits with your health care provider that can promote health and wellness. What does preventive care include?  A yearly physical exam. This is also called an annual well check.  Dental exams once or twice a year.  Routine eye exams. Ask your health care provider how often you should have your eyes checked.  Personal lifestyle choices, including:  Daily care of your teeth and gums.  Regular physical activity.  Eating a healthy diet.  Avoiding tobacco and drug use.  Limiting alcohol use.  Practicing safe sex.  Taking low-dose aspirin every day starting at age 9. What happens during an annual well check? The services and screenings done by your health care provider during your annual well check will depend on your age, overall health, lifestyle risk factors, and family history of disease. Counseling  Your health care provider may ask you questions about your:  Alcohol use.  Tobacco use.  Drug use.  Emotional well-being.  Home and relationship well-being.  Sexual activity.  Eating habits.  Work and  work Statistician. Screening  You may have the following tests or measurements:  Height, weight, and BMI.  Blood pressure.  Lipid and cholesterol levels. These may be checked every 5 years, or more frequently if you are over 39 years old.  Skin check.  Lung cancer screening. You may have this screening every year starting at age 19 if you have a 30-pack-year history of smoking and currently smoke or have quit within the past 15 years.  Fecal occult blood test (FOBT) of the stool. You may have this test every year starting at age 11.  Flexible sigmoidoscopy or colonoscopy. You may have a sigmoidoscopy every 5 years or a colonoscopy every 10 years starting at age 32.  Prostate cancer screening. Recommendations will vary depending on your family history and other risks.  Hepatitis C blood test.  Hepatitis B blood test.  Sexually transmitted disease (STD) testing.  Diabetes screening. This is done by checking your blood sugar (glucose) after you have not eaten for a while (fasting). You may have this done every 1-3 years. Discuss your test results, treatment options, and if necessary, the need for more tests with your health care provider. Vaccines  Your health care provider may recommend certain vaccines, such as:  Influenza vaccine. This is recommended every year.  Tetanus, diphtheria, and acellular pertussis (Tdap, Td) vaccine. You may need a Td booster every 10 years.  Zoster vaccine. You may need this after age 73.  Pneumococcal 13-valent conjugate (PCV13) vaccine. You may need this if you have certain conditions and have not been vaccinated.  Pneumococcal polysaccharide (PPSV23) vaccine. You may need one or two doses if you smoke cigarettes or if you have certain conditions.  Talk to your health care provider about which screenings and vaccines you need and how often you need them. This information is not intended to replace advice given to you by your health care provider.  Make sure you discuss any questions you have with your health care provider. Document Released: 07/02/2015 Document Revised: 02/23/2016 Document Reviewed: 04/06/2015 Elsevier Interactive Patient Education  2017 [place] Prevention in the Home Falls can cause injuries. They can happen to people of all ages. There are many things you can do to make your home safe and to help prevent falls. What can I do on the outside of my home?  Regularly fix the edges of walkways and driveways and fix any cracks.  Remove anything that might make you trip as you walk through a door, such as a raised step or threshold.  Trim any bushes or trees on the path to your home.  Use bright outdoor lighting.  Clear any walking paths of anything that might make someone trip, such as rocks or tools.  Regularly check to see if handrails are loose or broken. Make sure that both sides of any steps have handrails.  Any raised decks and porches should have guardrails on the edges.  Have any leaves, snow, or ice cleared regularly.  Use sand or salt on walking paths during winter.  Clean up any spills in your garage right away. This includes oil or grease spills. What can I do in the bathroom?  Use night lights.  Install grab bars by the toilet and in the tub and shower. Do not use towel bars as grab bars.  Use non-skid mats or decals in the tub or shower.  If you need to sit down in the shower, use a plastic, non-slip stool.  Keep the floor dry. Clean up any water that spills on the floor as soon as it happens.  Remove soap buildup in the tub or shower regularly.  Attach bath mats securely with double-sided non-slip rug tape.  Do not have throw rugs and other things on the floor that can make you trip. What can I do in the bedroom?  Use night lights.  Make sure that you have a light by your bed that is easy to reach.  Do not use any sheets or blankets that are too big for your bed. They  should not hang down onto the floor.  Have a firm chair that has side arms. You can use this for support while you get dressed.  Do not have throw rugs and other things on the floor that can make you trip. What can I do in the kitchen?  Clean up any spills right away.  Avoid walking on wet floors.  Keep items that you use a lot in easy-to-reach places.  If you need to reach something above you, use a strong step stool that has a grab bar.  Keep electrical cords out of the way.  Do not use floor polish or wax that makes floors slippery. If you must use wax, use non-skid floor wax.  Do not have throw rugs and other things on the floor that can make you trip. What can I do with my stairs?  Do not leave any items on the stairs.  Make sure that there are handrails on both sides of the stairs and use them. Fix handrails that are broken or loose. Make sure that handrails are as long as the stairways.  Check any carpeting to make sure  that it is firmly attached to the stairs. Fix any carpet that is loose or worn.  Avoid having throw rugs at the top or bottom of the stairs. If you do have throw rugs, attach them to the floor with carpet tape.  Make sure that you have a light switch at the top of the stairs and the bottom of the stairs. If you do not have them, ask someone to add them for you. What else can I do to help prevent falls?  Wear shoes that:  Do not have high heels.  Have rubber bottoms.  Are comfortable and fit you well.  Are closed at the toe. Do not wear sandals.  If you use a stepladder:  Make sure that it is fully opened. Do not climb a closed stepladder.  Make sure that both sides of the stepladder are locked into place.  Ask someone to hold it for you, if possible.  Clearly mark and make sure that you can see:  Any grab bars or handrails.  First and last steps.  Where the edge of each step is.  Use tools that help you move around (mobility aids) if  they are needed. These include:  Canes.  Walkers.  Scooters.  Crutches.  Turn on the lights when you go into a dark area. Replace any light bulbs as soon as they burn out.  Set up your furniture so you have a clear path. Avoid moving your furniture around.  If any of your floors are uneven, fix them.  If there are any pets around you, be aware of where they are.  Review your medicines with your doctor. Some medicines can make you feel dizzy. This can increase your chance of falling. Ask your doctor what other things that you can do to help prevent falls. This information is not intended to replace advice given to you by your health care provider. Make sure you discuss any questions you have with your health care provider. Document Released: 04/01/2009 Document Revised: 11/11/2015 Document Reviewed: 07/10/2014 Elsevier Interactive Patient Education  2017 [employer].

## 2019-05-28 NOTE — Progress Notes (Signed)
PCP notes:  Health Maintenance: Declined colonoscopy due to pandemic Will check with insurance regarding shingrix coverage   Abnormal Screenings: none   Patient concerns: Patient states that he is having episodes of "jerking" when he wakes up sometimes. He is concerned this could be a form of seizures he is having.    Nurse concerns: none   Next PCP appt.: 05/29/2019 @ 10:45 am

## 2019-05-29 ENCOUNTER — Encounter: Payer: Self-pay | Admitting: Family Medicine

## 2019-05-29 ENCOUNTER — Ambulatory Visit (INDEPENDENT_AMBULATORY_CARE_PROVIDER_SITE_OTHER): Payer: Medicare Other | Admitting: Family Medicine

## 2019-05-29 ENCOUNTER — Other Ambulatory Visit: Payer: Self-pay

## 2019-05-29 ENCOUNTER — Ambulatory Visit: Payer: Medicare Other

## 2019-05-29 VITALS — BP 116/70 | HR 66 | Temp 97.3°F | Ht 71.0 in | Wt 222.6 lb

## 2019-05-29 DIAGNOSIS — Z7189 Other specified counseling: Secondary | ICD-10-CM

## 2019-05-29 DIAGNOSIS — G40209 Localization-related (focal) (partial) symptomatic epilepsy and epileptic syndromes with complex partial seizures, not intractable, without status epilepticus: Secondary | ICD-10-CM

## 2019-05-29 DIAGNOSIS — Z0000 Encounter for general adult medical examination without abnormal findings: Secondary | ICD-10-CM

## 2019-05-29 DIAGNOSIS — E785 Hyperlipidemia, unspecified: Secondary | ICD-10-CM

## 2019-05-29 DIAGNOSIS — Z125 Encounter for screening for malignant neoplasm of prostate: Secondary | ICD-10-CM

## 2019-05-29 DIAGNOSIS — E039 Hypothyroidism, unspecified: Secondary | ICD-10-CM | POA: Diagnosis not present

## 2019-05-29 DIAGNOSIS — Z79899 Other long term (current) drug therapy: Secondary | ICD-10-CM | POA: Diagnosis not present

## 2019-05-29 DIAGNOSIS — I10 Essential (primary) hypertension: Secondary | ICD-10-CM

## 2019-05-29 DIAGNOSIS — G40909 Epilepsy, unspecified, not intractable, without status epilepticus: Secondary | ICD-10-CM | POA: Diagnosis not present

## 2019-05-29 DIAGNOSIS — G43909 Migraine, unspecified, not intractable, without status migrainosus: Secondary | ICD-10-CM

## 2019-05-29 LAB — COMPREHENSIVE METABOLIC PANEL
ALT: 42 U/L (ref 0–53)
AST: 33 U/L (ref 0–37)
Albumin: 4.5 g/dL (ref 3.5–5.2)
Alkaline Phosphatase: 68 U/L (ref 39–117)
BUN: 18 mg/dL (ref 6–23)
CO2: 30 mEq/L (ref 19–32)
Calcium: 9.7 mg/dL (ref 8.4–10.5)
Chloride: 102 mEq/L (ref 96–112)
Creatinine, Ser: 0.83 mg/dL (ref 0.40–1.50)
GFR: 94.85 mL/min (ref >60.00)
Glucose, Bld: 79 mg/dL (ref 70–99)
Potassium: 4.5 mEq/L (ref 3.5–5.1)
Sodium: 139 mEq/L (ref 135–145)
Total Bilirubin: 1 mg/dL (ref 0.2–1.2)
Total Protein: 7.1 g/dL (ref 6.0–8.3)

## 2019-05-29 LAB — LIPID PANEL
Cholesterol: 135 mg/dL (ref 0–200)
HDL: 33.8 mg/dL — ABNORMAL LOW (ref >39.00)
LDL Cholesterol: 67 mg/dL (ref 0–99)
NonHDL: 101
Total CHOL/HDL Ratio: 4
Triglycerides: 169 mg/dL — ABNORMAL HIGH (ref 0.0–149.0)
VLDL: 33.8 mg/dL (ref 0.0–40.0)

## 2019-05-29 LAB — PSA, MEDICARE: PSA: 2.63 ng/ml (ref 0.10–4.00)

## 2019-05-29 LAB — TSH: TSH: 0.89 u[IU]/mL (ref 0.35–4.50)

## 2019-05-29 MED ORDER — RIZATRIPTAN BENZOATE 5 MG PO TBDP: ORAL_TABLET | ORAL | 12 refills | Status: DC

## 2019-05-29 NOTE — Patient Instructions (Addendum)
Check with your insurance to see if they will cover the shingles shot. Go to the lab on the way out.  We'll contact you with your lab report. Update me as needed.  Take care.  Glad to see you.  Don't change your meds for now.

## 2019-05-29 NOTE — Progress Notes (Signed)
This visit occurred during the SARS-CoV-2 public health emergency.  Safety protocols were in place, including screening questions prior to the visit, additional usage of staff PPE, and extensive cleaning of exam room while observing appropriate contact time as indicated for disinfecting solutions.   Advance directive- Jobie Quaker designated if patient were incapacitated.  Colonoscopy d/w pt.   defer given pandemic per patient request. PSA pending.  D/w pt.  tdap 2013 Flu shot 2020 Shingles d/w pt.  See avs.   PNA not due yet.    Elevated Cholesterol: Using medications without problems: yes Muscle aches: no Diet compliance: yes Exercise: yes  Hypothyroidism.  Compliant.  No ADE on med.  Due for labs.  No neck mass.  No dysphagia.    Hypertension:    Using medication without problems or lightheadedness: yes Chest pain with exertion:no Edema:rare, controlled with chlorthalidone.   Short of breath:no Labs pending.   SZ hx noted. Compliant. Patient states that he had 1 episode of "jerking" when he woke up that may have been in the midst of a dream. This wasn't typical for his prev SZ activity.  No typical post ictal sx.  No B/B sx.  No events o/w.    He has still has some episodic migraines.  No ADE on med.  With relief with maxalt.    PMH and SH reviewed  Meds, vitals, and allergies reviewed.   ROS: Per HPI unless specifically indicated in ROS section   GEN: nad, alert and oriented HEENT: ncat NECK: supple w/o LA CV: rrr. PULM: ctab, no inc wob ABD: soft, +bs EXT: no edema SKIN: no acute rash CN 2-12 wnl B, S/S wnl.

## 2019-05-30 LAB — VALPROIC ACID LEVEL: Valproic Acid Lvl: 111.2 mg/L — ABNORMAL HIGH (ref 50.0–100.0)

## 2019-06-01 ENCOUNTER — Other Ambulatory Visit: Payer: Self-pay | Admitting: Family Medicine

## 2019-06-01 MED ORDER — DIVALPROEX SODIUM ER 500 MG PO TB24: ORAL_TABLET | ORAL | 3 refills | Status: DC

## 2019-06-01 NOTE — Assessment & Plan Note (Signed)
He has still has some episodic migraines.  No ADE on med.  With relief with maxalt.   Continue as is.  He agrees.

## 2019-06-01 NOTE — Assessment & Plan Note (Signed)
Compliant.  No ADE on med.  Due for labs.  No neck mass.  No dysphagia.   See notes on labs.

## 2019-06-01 NOTE — Assessment & Plan Note (Signed)
No change in meds.  Continue work on diet and exercise.  See notes on labs.

## 2019-06-01 NOTE — Assessment & Plan Note (Addendum)
This previous episode sounds like it was related to a dream (where he was startled) and he did not have typical post ictal symptoms and he did not have typical seizure symptoms.  He has no symptoms otherwise.  I do not think this represents seizure activity.  Discussed with patient.  Check routine labs today.  We did not change his medications at this point.  >25 minutes spent in face to face time with patient, >50% spent in counselling or coordination of care

## 2019-06-01 NOTE — Assessment & Plan Note (Signed)
Advance directive- Don Hicks designated if patient were incapacitated.  

## 2019-06-01 NOTE — Assessment & Plan Note (Signed)
Advance directive- Jobie Quaker designated if patient were incapacitated.  Colonoscopy d/w pt.   defer given pandemic per patient request. PSA pending.  D/w pt.  tdap 2013 Flu shot 2020 Shingles d/w pt.  See avs.   PNA not due yet.

## 2019-06-02 ENCOUNTER — Telehealth: Payer: Self-pay

## 2019-06-02 NOTE — Telephone Encounter (Signed)
Pt left v/m requesting cb with recent lab results.

## 2019-06-03 NOTE — Telephone Encounter (Signed)
Did not see result note in [place] or Dr Josefine Class in box and I called pt and notified as instructed from the 05/29/19 result note of Dr Josefine Class comments on 06/01/19; pt voiced understanding and nothing further needed.

## 2019-06-11 ENCOUNTER — Other Ambulatory Visit: Payer: Self-pay | Admitting: Cardiovascular Disease

## 2019-07-23 ENCOUNTER — Other Ambulatory Visit: Payer: Self-pay | Admitting: Cardiovascular Disease

## 2019-07-23 DIAGNOSIS — I10 Essential (primary) hypertension: Secondary | ICD-10-CM

## 2019-11-14 ENCOUNTER — Other Ambulatory Visit: Payer: Self-pay | Admitting: Family Medicine

## 2019-12-03 ENCOUNTER — Encounter: Payer: Self-pay | Admitting: Dermatology

## 2019-12-03 ENCOUNTER — Other Ambulatory Visit: Payer: Self-pay

## 2019-12-03 ENCOUNTER — Ambulatory Visit: Payer: Medicare Other | Admitting: Dermatology

## 2019-12-03 DIAGNOSIS — L578 Other skin changes due to chronic exposure to nonionizing radiation: Secondary | ICD-10-CM

## 2019-12-03 DIAGNOSIS — L738 Other specified follicular disorders: Secondary | ICD-10-CM | POA: Diagnosis not present

## 2019-12-03 DIAGNOSIS — L720 Epidermal cyst: Secondary | ICD-10-CM

## 2019-12-03 DIAGNOSIS — Z1283 Encounter for screening for malignant neoplasm of skin: Secondary | ICD-10-CM

## 2019-12-03 DIAGNOSIS — D2372 Other benign neoplasm of skin of left lower limb, including hip: Secondary | ICD-10-CM | POA: Diagnosis not present

## 2019-12-03 DIAGNOSIS — L918 Other hypertrophic disorders of the skin: Secondary | ICD-10-CM

## 2019-12-03 DIAGNOSIS — D1801 Hemangioma of skin and subcutaneous tissue: Secondary | ICD-10-CM

## 2019-12-03 DIAGNOSIS — L570 Actinic keratosis: Secondary | ICD-10-CM | POA: Diagnosis not present

## 2019-12-03 DIAGNOSIS — D229 Melanocytic nevi, unspecified: Secondary | ICD-10-CM

## 2019-12-03 DIAGNOSIS — L814 Other melanin hyperpigmentation: Secondary | ICD-10-CM

## 2019-12-03 DIAGNOSIS — I8310 Varicose veins of unspecified lower extremity with inflammation: Secondary | ICD-10-CM

## 2019-12-03 DIAGNOSIS — L821 Other seborrheic keratosis: Secondary | ICD-10-CM

## 2019-12-03 DIAGNOSIS — D239 Other benign neoplasm of skin, unspecified: Secondary | ICD-10-CM

## 2019-12-03 NOTE — Progress Notes (Signed)
Follow-Up Visit   Subjective  Kevin Charles is a 59 y.o. male who presents for the following: milia (R cheek, used Tazorac 0.05% cr didnt help), Actinic Keratosis (face, ears f/u), hx of scaly spot (glabella, pt scratched off), and Annual Exam (Total body skin exam, hx of BCC R shoulder). The patient presents for Total-Body Skin Exam (TBSE) for skin cancer screening and mole check.  The following portions of the chart were reviewed this encounter and updated as appropriate:  Tobacco  Allergies  Meds  Problems  Med Hx  Surg Hx  Fam Hx      Review of Systems:  No other skin or systemic complaints except as noted in HPI or Assessment and Plan.  Objective  Well appearing patient in no apparent distress; mood and affect are within normal limits.  A full examination was performed including scalp, head, eyes, ears, nose, lips, neck, chest, axillae, abdomen, back, buttocks, bilateral upper extremities, bilateral lower extremities, hands, feet, fingers, toes, fingernails, and toenails. All findings within normal limits unless otherwise noted below.  Objective  Face: Small yellow papules with a central dell.   Objective  R temple, glabella: Smooth white papule(s).   Objective  Scalp, ears, nose x 12 (12): Pink scaly macules   Objective  L pretibial: Firm pink/brown papulenodule with dimple sign.    Assessment & Plan    Lentigines - Scattered tan macules - Discussed due to sun exposure - Benign, observe - Call for any changes  Seborrheic Keratoses - Stuck-on, waxy, tan-brown papules and plaques  - Discussed benign etiology and prognosis. - Observe - Call for any changes  Melanocytic Nevi - Tan-brown and/or pink-flesh-colored symmetric macules and papules - Benign appearing on exam today - Observation - Call clinic for new or changing moles - Recommend daily use of broad spectrum spf 30+ sunscreen to sun-exposed areas.   Hemangiomas - Red papules -  Discussed benign nature - Observe - Call for any changes  Actinic Damage - diffuse scaly erythematous macules with underlying dyspigmentation - Recommend daily broad spectrum sunscreen SPF 30+ to sun-exposed areas, reapply every 2 hours as needed.  - Call for new or changing lesions.  Skin cancer screening performed today.  Acrochordons (Skin Tags) - Fleshy, skin-colored pedunculated papules - Benign appearing.  - Observe. - If desired, they can be removed with an in office procedure that is not covered by insurance. - Please call the clinic if you notice any new or changing lesions.  Sebaceous hyperplasia Face  Benign, discussed txt options, ED vs exc  Milia R temple, glabella  Cont Tazorac 0.05% cr qhs sample x 1 Lot [id number] exo 12/22 or may try Aklief 0.005% cr qhs sample x 1, Lot [id number] 03/23  AK (actinic keratosis) (12) Scalp, ears, nose x 12  Destruction of lesion - Scalp, ears, nose x 12 Complexity: simple   Destruction method: cryotherapy   Informed consent: discussed and consent obtained   Timeout:  patient name, date of birth, surgical site, and procedure verified Lesion destroyed using liquid nitrogen: Yes   Region frozen until ice ball extended beyond lesion: Yes   Outcome: patient tolerated procedure well with no complications   Post-procedure details: wound care instructions given    Dermatofibroma L pretibial  Benign, observe.    Skin cancer screening   Varicose Veins - Dilated blue, purple or red veins at the lower extremities - Reassured - These can be treated by sclerotherapy (a procedure to inject a medicine into  the veins to make them disappear) if desired, but the treatment is not covered by insurance  Return in about 1 year (around 12/02/2020) for TBSE hx BCC, AKs.   I, Sonya Hupman, RMA, am acting as scribe for Sarina Ser, MD .  Documentation: I have reviewed the above documentation for accuracy and completeness, and I agree with the  above.  Sarina Ser, MD

## 2019-12-03 NOTE — Patient Instructions (Signed)
Cryotherapy Aftercare  . Wash gently with soap and water everyday.   . Apply Vaseline and Band-Aid daily until healed.  

## 2019-12-07 ENCOUNTER — Encounter: Payer: Self-pay | Admitting: Dermatology

## 2019-12-18 ENCOUNTER — Ambulatory Visit (INDEPENDENT_AMBULATORY_CARE_PROVIDER_SITE_OTHER): Payer: Medicare Other | Admitting: Family Medicine

## 2019-12-18 ENCOUNTER — Encounter: Payer: Self-pay | Admitting: Family Medicine

## 2019-12-18 ENCOUNTER — Other Ambulatory Visit: Payer: Self-pay

## 2019-12-18 DIAGNOSIS — L237 Allergic contact dermatitis due to plants, except food: Secondary | ICD-10-CM

## 2019-12-18 MED ORDER — PREDNISONE 20 MG PO TABS: ORAL_TABLET | ORAL | 0 refills | Status: DC

## 2019-12-18 NOTE — Progress Notes (Signed)
This visit occurred during the SARS-CoV-2 public health emergency.  Safety protocols were in place, including screening questions prior to the visit, additional usage of staff PPE, and extensive cleaning of exam room while observing appropriate contact time as indicated for disinfecting solutions.  He had his covid vaccine.    One of his uncles recently died, condolences offered.    Poison oak exposure.  He had a spot on his face.  Then had a similar lesion in the L groin.  TAC cream helped some.  Then with more lesions this week, after helping a friend, on the trunk, groin, etc.  All are itchy.  Meds, vitals, and allergies reviewed.   ROS: Per HPI unless specifically indicated in ROS section   nad ncat Skin with irregular distribution of maculopapular lesions typical for rhus dermatitis without discharge or ulceration.

## 2019-12-18 NOTE — Patient Instructions (Signed)
Take prednisone 2 a day for 5 days, then 1 a day for 5 days, with food. Don't take with aleve/ibuprofen. Update me as needed.  Take care.  Glad to see you.

## 2019-12-22 NOTE — Assessment & Plan Note (Signed)
Lesions do not appear infected.  Steroid cautions discussed with patient.  Start prednisone and update me as needed.  He agrees.  Okay for outpatient follow-up.

## 2020-01-02 ENCOUNTER — Encounter: Payer: Self-pay | Admitting: Family Medicine

## 2020-01-02 ENCOUNTER — Ambulatory Visit (INDEPENDENT_AMBULATORY_CARE_PROVIDER_SITE_OTHER): Payer: Medicare Other | Admitting: Family Medicine

## 2020-01-02 DIAGNOSIS — R21 Rash and other nonspecific skin eruption: Secondary | ICD-10-CM

## 2020-01-02 MED ORDER — PREDNISONE 20 MG PO TABS: ORAL_TABLET | ORAL | 0 refills | Status: DC

## 2020-01-02 NOTE — Patient Instructions (Addendum)
Restart prednisone and let me know if that doesn't help.  Take care.  Glad to see you.

## 2020-01-02 NOTE — Progress Notes (Signed)
This visit occurred during the SARS-CoV-2 public health emergency.  Safety protocols were in place, including screening questions prior to the visit, additional usage of staff PPE, and extensive cleaning of exam room while observing appropriate contact time as indicated for disinfecting solutions.  Prev poison oak resolved.  He was able to tolerate prednisone.  Was at a friend's house outside at a gazebo briefly then inside and "it felt like something was stinging me".  No known insect bite.  Diffuse red spots on the R arm and R side of trunk only noted after the fact.  No one else had troubles.  This doesn't feel likely prev poison oak exposure.  Sx started yesterday.  No fevers.  He noted new lesions on the R arm today.    Meds, vitals, and allergies reviewed.   ROS: Per HPI unless specifically indicated in ROS section   nad ncat Skin with small blanching maculopapular erythematous lesions without ulceration across the right arm and right trunk, with a few scattered lesions located on the back otherwise.  No dermatomal distribution.  No lesions on the palms or between the digits.

## 2020-01-04 NOTE — Assessment & Plan Note (Signed)
Unlikely to be poison oak or similar.  Atypical for scabies with this exam.  Discussed options.  Given the distribution, reasonable to restart prednisone for itching as I expect these lesions to heal otherwise.  He will update me as needed.  Not systemically ill.  He agrees to plan.

## 2020-02-26 ENCOUNTER — Other Ambulatory Visit: Payer: Self-pay | Admitting: Family Medicine

## 2020-05-11 ENCOUNTER — Other Ambulatory Visit: Payer: Self-pay

## 2020-05-11 ENCOUNTER — Encounter: Payer: Self-pay | Admitting: Cardiovascular Disease

## 2020-05-11 ENCOUNTER — Ambulatory Visit: Payer: Medicare Other | Admitting: Cardiovascular Disease

## 2020-05-11 VITALS — BP 126/88 | HR 61 | Ht 71.0 in | Wt 224.0 lb

## 2020-05-11 DIAGNOSIS — I10 Essential (primary) hypertension: Secondary | ICD-10-CM

## 2020-05-11 DIAGNOSIS — I2510 Atherosclerotic heart disease of native coronary artery without angina pectoris: Secondary | ICD-10-CM

## 2020-05-11 DIAGNOSIS — E785 Hyperlipidemia, unspecified: Secondary | ICD-10-CM
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
## 2020-05-11 NOTE — Patient Instructions (Signed)
Medication Instructions:  Your physician recommends that you continue on your current medications as directed. Please refer to the Current Medication list given to you today.  *If you need a refill on your cardiac medications before your next appointment, please call your pharmacy*   Lab Work: None ordered If you have labs (blood work) drawn today and your tests are completely normal, you will receive your results only by: . MyChart Message (if you have MyChart) OR . A paper copy in the mail If you have any lab test that is abnormal or we need to change your treatment, we will call you to review the results.   Testing/Procedures: None ordered   Follow-Up: At [facility], you and your health needs are our priority.  As part of our continuing mission to provide you with exceptional heart care, we have created designated Provider Care Teams.  These Care Teams include your primary Cardiologist (physician) and Advanced Practice Providers (APPs -  Physician Assistants and Nurse Practitioners) who all work together to provide you with the care you need, when you need it.  We recommend signing up for the patient portal called "MyChart".  Sign up information is provided on this After Visit Summary.  MyChart is used to connect with patients for Virtual Visits (Telemedicine).  Patients are able to view lab/test results, encounter notes, upcoming appointments, etc.  Non-urgent messages can be sent to your provider as well.   To learn more about what you can do with MyChart, go to [email or web address].    Your next appointment:   12 month(s)  The format for your next appointment:   In Person  Provider:   You may see Muhammad Arida, MD or one of the following Advanced Practice Providers on your designated Care Team:    Christopher Berge, NP  Ryan Dunn, PA-C  Jacquelyn Visser, PA-C  Cadence Furth, PA-C  Caitlin Walker, NP    Other Instructions N/A  

## 2020-05-19 ENCOUNTER — Other Ambulatory Visit: Payer: Self-pay | Admitting: Cardiovascular Disease

## 2020-06-01 ENCOUNTER — Ambulatory Visit (INDEPENDENT_AMBULATORY_CARE_PROVIDER_SITE_OTHER): Payer: Medicare Other

## 2020-06-01 ENCOUNTER — Other Ambulatory Visit: Payer: Self-pay

## 2020-06-01 DIAGNOSIS — Z0000 Encounter for general adult medical examination without abnormal findings: Secondary | ICD-10-CM | POA: Diagnosis not present
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
## 2020-06-01 NOTE — Progress Notes (Signed)
PCP notes:  Health Maintenance: Colonoscopy- due   Abnormal Screenings: none   Patient concerns: none   Nurse concerns: none   Next PCP appt.: 06/03/2020 @ 10 am

## 2020-06-01 NOTE — Patient Instructions (Signed)
[name] , Thank you for taking time to come for your Medicare Wellness Visit. I appreciate your ongoing commitment to your health goals. Please review the following plan we discussed and let me know if I can assist you in the future.   Screening recommendations/referrals: Colonoscopy: due, wants to discuss with provider first  Recommended yearly ophthalmology/optometry visit for glaucoma screening and checkup Recommended yearly dental visit for hygiene and checkup  Vaccinations: Influenza vaccine: Up to date, completed 03/17/2020, due 01/2021 Pneumococcal vaccine: start at age 54  Tdap vaccine: Up to date, completed 02/05/2012, due 01/2022 Shingles vaccine: Completed series   Covid-19: Completed series  Advanced directives: Please bring a copy of your POA (Power of Attorney) and/or Living Will to your next appointment.   Conditions/risks identified: hypertension, hyperlipidemia  Next appointment: Follow up in one year for your annual wellness visit.   Preventive Care 60-8 Year Old, Male Preventive care refers to lifestyle choices and visits with your health care provider that can promote health and wellness. What does preventive care include?  A yearly physical exam. This is also called an annual well check.  Dental exams once or twice a year.  Routine eye exams. Ask your health care provider how often you should have your eyes checked.  Personal lifestyle choices, including:  Daily care of your teeth and gums.  Regular physical activity.  Eating a healthy diet.  Avoiding tobacco and drug use.  Limiting alcohol use.  Practicing safe sex.  Taking low doses of aspirin every day.  Taking vitamin and mineral supplements as recommended by your health care provider. What happens during an annual well check? The services and screenings done by your health care provider during your annual well check will depend on your age, overall health, lifestyle risk factors, and family  history of disease. Counseling  Your health care provider may ask you questions about your:  Alcohol use.  Tobacco use.  Drug use.  Emotional well-being.  Home and relationship well-being.  Sexual activity.  Eating habits.  History of falls.  Memory and ability to understand (cognition).  Work and work Statistician. Screening  You may have the following tests or measurements:  Height, weight, and BMI.  Blood pressure.  Lipid and cholesterol levels. These may be checked every 5 years, or more frequently if you are over 102 years old.  Skin check.  Lung cancer screening. You may have this screening every year starting at age 36 if you have a 30-pack-year history of smoking and currently smoke or have quit within the past 15 years.  Fecal occult blood test (FOBT) of the stool. You may have this test every year starting at age 60.  Flexible sigmoidoscopy or colonoscopy. You may have a sigmoidoscopy every 5 years or a colonoscopy every 10 years starting at age 25.  Prostate cancer screening. Recommendations will vary depending on your family history and other risks.  Hepatitis C blood test.  Hepatitis B blood test.  Sexually transmitted disease (STD) testing.  Diabetes screening. This is done by checking your blood sugar (glucose) after you have not eaten for a while (fasting). You may have this done every 1-3 years.  Abdominal aortic aneurysm (AAA) screening. You may need this if you are a current or former smoker.  Osteoporosis. You may be screened starting at age 61 if you are at high risk. Talk with your health care provider about your test results, treatment options, and if necessary, the need for more tests. Vaccines  Your health care provider may recommend certain vaccines, such as:  Influenza vaccine. This is recommended every year.  Tetanus, diphtheria, and acellular pertussis (Tdap, Td) vaccine. You may need a Td booster every 10 years.  Zoster vaccine.  You may need this after age 24.  Pneumococcal 13-valent conjugate (PCV13) vaccine. One dose is recommended after age 26.  Pneumococcal polysaccharide (PPSV23) vaccine. One dose is recommended after age 69. Talk to your health care provider about which screenings and vaccines you need and how often you need them. This information is not intended to replace advice given to you by your health care provider. Make sure you discuss any questions you have with your health care provider. Document Released: 07/02/2015 Document Revised: 02/23/2016 Document Reviewed: 04/06/2015 Elsevier Interactive Patient Education  2017 [place] Prevention in the Home Falls can cause injuries. They can happen to people of all ages. There are many things you can do to make your home safe and to help prevent falls. What can I do on the outside of my home?  Regularly fix the edges of walkways and driveways and fix any cracks.  Remove anything that might make you trip as you walk through a door, such as a raised step or threshold.  Trim any bushes or trees on the path to your home.  Use bright outdoor lighting.  Clear any walking paths of anything that might make someone trip, such as rocks or tools.  Regularly check to see if handrails are loose or broken. Make sure that both sides of any steps have handrails.  Any raised decks and porches should have guardrails on the edges.  Have any leaves, snow, or ice cleared regularly.  Use sand or salt on walking paths during winter.  Clean up any spills in your garage right away. This includes oil or grease spills. What can I do in the bathroom?  Use night lights.  Install grab bars by the toilet and in the tub and shower. Do not use towel bars as grab bars.  Use non-skid mats or decals in the tub or shower.  If you need to sit down in the shower, use a plastic, non-slip stool.  Keep the floor dry. Clean up any water that spills on the floor as soon  as it happens.  Remove soap buildup in the tub or shower regularly.  Attach bath mats securely with double-sided non-slip rug tape.  Do not have throw rugs and other things on the floor that can make you trip. What can I do in the bedroom?  Use night lights.  Make sure that you have a light by your bed that is easy to reach.  Do not use any sheets or blankets that are too big for your bed. They should not hang down onto the floor.  Have a firm chair that has side arms. You can use this for support while you get dressed.  Do not have throw rugs and other things on the floor that can make you trip. What can I do in the kitchen?  Clean up any spills right away.  Avoid walking on wet floors.  Keep items that you use a lot in easy-to-reach places.  If you need to reach something above you, use a strong step stool that has a grab bar.  Keep electrical cords out of the way.  Do not use floor polish or wax that makes floors slippery. If you must use wax, use non-skid floor wax.  Do  not have throw rugs and other things on the floor that can make you trip. What can I do with my stairs?  Do not leave any items on the stairs.  Make sure that there are handrails on both sides of the stairs and use them. Fix handrails that are broken or loose. Make sure that handrails are as long as the stairways.  Check any carpeting to make sure that it is firmly attached to the stairs. Fix any carpet that is loose or worn.  Avoid having throw rugs at the top or bottom of the stairs. If you do have throw rugs, attach them to the floor with carpet tape.  Make sure that you have a light switch at the top of the stairs and the bottom of the stairs. If you do not have them, ask someone to add them for you. What else can I do to help prevent falls?  Wear shoes that:  Do not have high heels.  Have rubber bottoms.  Are comfortable and fit you well.  Are closed at the toe. Do not wear sandals.  If  you use a stepladder:  Make sure that it is fully opened. Do not climb a closed stepladder.  Make sure that both sides of the stepladder are locked into place.  Ask someone to hold it for you, if possible.  Clearly mark and make sure that you can see:  Any grab bars or handrails.  First and last steps.  Where the edge of each step is.  Use tools that help you move around (mobility aids) if they are needed. These include:  Canes.  Walkers.  Scooters.  Crutches.  Turn on the lights when you go into a dark area. Replace any light bulbs as soon as they burn out.  Set up your furniture so you have a clear path. Avoid moving your furniture around.  If any of your floors are uneven, fix them.  If there are any pets around you, be aware of where they are.  Review your medicines with your doctor. Some medicines can make you feel dizzy. This can increase your chance of falling. Ask your doctor what other things that you can do to help prevent falls. This information is not intended to replace advice given to you by your health care provider. Make sure you discuss any questions you have with your health care provider. Document Released: 04/01/2009 Document Revised: 11/11/2015 Document Reviewed: 07/10/2014 Elsevier Interactive Patient Education  2017 [employer].

## 2020-06-03 ENCOUNTER — Encounter: Payer: Self-pay | Admitting: Family Medicine

## 2020-06-03 ENCOUNTER — Other Ambulatory Visit: Payer: Self-pay

## 2020-06-03 ENCOUNTER — Ambulatory Visit (INDEPENDENT_AMBULATORY_CARE_PROVIDER_SITE_OTHER): Payer: Medicare Other | Admitting: Family Medicine

## 2020-06-03 VITALS — BP 134/70 | HR 58 | Temp 96.5°F | Ht 71.0 in | Wt 228.4 lb

## 2020-06-03 DIAGNOSIS — R399 Unspecified symptoms and signs involving the genitourinary system: Secondary | ICD-10-CM | POA: Diagnosis not present

## 2020-06-03 DIAGNOSIS — M25519 Pain in unspecified shoulder: Secondary | ICD-10-CM

## 2020-06-03 DIAGNOSIS — I10 Essential (primary) hypertension: Secondary | ICD-10-CM

## 2020-06-03 DIAGNOSIS — G40909 Epilepsy, unspecified, not intractable, without status epilepticus: Secondary | ICD-10-CM

## 2020-06-03 DIAGNOSIS — Z125 Encounter for screening for malignant neoplasm of prostate: Secondary | ICD-10-CM

## 2020-06-03 DIAGNOSIS — E039 Hypothyroidism, unspecified: Secondary | ICD-10-CM | POA: Diagnosis not present

## 2020-06-03 DIAGNOSIS — Z79899 Other long term (current) drug therapy: Secondary | ICD-10-CM

## 2020-06-03 DIAGNOSIS — E785 Hyperlipidemia, unspecified: Secondary | ICD-10-CM | POA: Diagnosis not present

## 2020-06-03 DIAGNOSIS — Z1211 Encounter for screening for malignant neoplasm of colon: Secondary | ICD-10-CM

## 2020-06-03 DIAGNOSIS — Z0000 Encounter for general adult medical examination without abnormal findings: Secondary | ICD-10-CM

## 2020-06-03 DIAGNOSIS — Z7189 Other specified counseling: Secondary | ICD-10-CM

## 2020-06-03 DIAGNOSIS — G43909 Migraine, unspecified, not intractable, without status migrainosus: Secondary | ICD-10-CM

## 2020-06-03 LAB — COMPREHENSIVE METABOLIC PANEL
ALT: 37 U/L (ref 0–53)
AST: 30 U/L (ref 0–37)
Albumin: 4.5 g/dL (ref 3.5–5.2)
Alkaline Phosphatase: 73 U/L (ref 39–117)
BUN: 17 mg/dL (ref 6–23)
CO2: 31 mEq/L (ref 19–32)
Calcium: 9.9 mg/dL (ref 8.4–10.5)
Chloride: 103 mEq/L (ref 96–112)
Creatinine, Ser: 0.78 mg/dL (ref 0.40–1.50)
GFR: 97.3 mL/min (ref >60.00)
Glucose, Bld: 75 mg/dL (ref 70–99)
Potassium: 4.5 mEq/L (ref 3.5–5.1)
Sodium: 140 mEq/L (ref 135–145)
Total Bilirubin: 0.9 mg/dL (ref 0.2–1.2)
Total Protein: 7.1 g/dL (ref 6.0–8.3)

## 2020-06-03 LAB — LIPID PANEL
Cholesterol: 150 mg/dL (ref 0–200)
HDL: 36.3 mg/dL — ABNORMAL LOW (ref >39.00)
LDL Cholesterol: 79 mg/dL (ref 0–99)
NonHDL: 114.14
Total CHOL/HDL Ratio: 4
Triglycerides: 174 mg/dL — ABNORMAL HIGH (ref 0.0–149.0)
VLDL: 34.8 mg/dL (ref 0.0–40.0)

## 2020-06-03 LAB — PSA, MEDICARE: PSA: 4.77 ng/ml — ABNORMAL HIGH (ref 0.10–4.00)

## 2020-06-03 LAB — TSH: TSH: 0.62 u[IU]/mL (ref 0.35–4.50)

## 2020-06-03 MED ORDER — METOPROLOL TARTRATE 25 MG PO TABS: 25.0000 mg | ORAL_TABLET | Freq: Every day | ORAL | 3 refills | Status: DC

## 2020-06-03 MED ORDER — DIVALPROEX SODIUM ER 500 MG PO TB24: ORAL_TABLET | ORAL | 3 refills | Status: DC

## 2020-06-03 MED ORDER — RIZATRIPTAN BENZOATE 5 MG PO TBDP: ORAL_TABLET | ORAL | 12 refills | Status: DC

## 2020-06-03 MED ORDER — PANTOPRAZOLE SODIUM 40 MG PO TBEC: 40.0000 mg | DELAYED_RELEASE_TABLET | Freq: Every day | ORAL | 3 refills | Status: DC

## 2020-06-03 NOTE — Patient Instructions (Addendum)
We'll call about GI/colonoscopy.   Go to the lab on the way out.   If you have mychart we'll likely use that to update you.    Use the shoulder exercises and update me as needed.  We can set you up with ortho if needed.  Take care.  Glad to see you.

## 2020-06-03 NOTE — Progress Notes (Signed)
This visit occurred during the SARS-CoV-2 public health emergency.  Safety protocols were in place, including screening questions prior to the visit, additional usage of staff PPE, and extensive cleaning of exam room while observing appropriate contact time as indicated for disinfecting solutions.  Health maintenance.  Advance directive- Jobie Quaker designated if patient were incapacitated.  Colonoscopy d/w pt. referral placed.   PSA pending. D/w pt.  tdap 2013 Flu shot 2021 Shingles 2021 PNA not due yet. covid vaccine 2021.    He has difficulty with LUTS with incomplete voiding.  We talked about flomax trial and he'll consider.    He is going to f/u with audiology for possible hearing aids.   Elevated Cholesterol: Using medications without problems: yes Muscle aches: no Diet compliance: yes Exercise: yes  He had cardiology f/u in the meantime.    Hypothyroidism.  Compliant.  No ADE on med.  Due for labs.  No neck mass.  No dysphagia.    Hypertension:               Using medication without problems or lightheadedness: yes Chest pain with exertion:no Edema:rare, controlled with chlorthalidone.   Short of breath:no Labs pending. See notes on labs.    SZ hx noted. Compliant. No events.  He has continued for years w/o events.  FH noted, mother had SZ.  Due for labs.  See notes on labs.    He has still has some episodic migraines.  No ADE on med.  With relief with maxalt.    L shoulder pain with ROM, putting on a coat.  Pain with ext rotation L shoulder.  Chronic issue for patient.  Pain sleeping on L side.  He wants to avoid surgery.    Meds, vitals, and allergies reviewed.   ROS: Per HPI unless specifically indicated in ROS section   GEN: nad, alert and oriented HEENT: ncat NECK: supple w/o LA CV: rrr PULM: ctab, no inc wob ABD: soft, +bs EXT: no edema SKIN: well perfused.  L shoulder with pain on ROM, with ext and int rotation. Less pain with ROM with scapular  manipulation.  No arm drop.  Distally NV intact.

## 2020-06-04 LAB — VALPROIC ACID LEVEL: Valproic Acid Lvl: 106.2 mg/L — ABNORMAL HIGH (ref 50.0–100.0)

## 2020-06-06 ENCOUNTER — Other Ambulatory Visit: Payer: Self-pay | Admitting: Family Medicine

## 2020-06-06 DIAGNOSIS — R972 Elevated prostate specific antigen [PSA]: Secondary | ICD-10-CM

## 2020-06-06 DIAGNOSIS — M25519 Pain in unspecified shoulder: Secondary | ICD-10-CM | POA: Insufficient documentation

## 2020-06-06 NOTE — Assessment & Plan Note (Signed)
Continue atorvastatin.  Diet and exercise discussed with patient.  See notes on labs.

## 2020-06-06 NOTE — Assessment & Plan Note (Signed)
He has difficulty with LUTS with incomplete voiding.  We talked about flomax trial and he'll consider.

## 2020-06-06 NOTE — Assessment & Plan Note (Signed)
Continue as needed Maxalt and update me as needed.

## 2020-06-06 NOTE — Assessment & Plan Note (Signed)
Likely rotator cuff irritation and he had better range of motion with scapular manipulation.  Home exercise program handout given to patient, demonstrated and explained.  He can use home exercises and update me if not better.  He agrees to plan.

## 2020-06-06 NOTE — Assessment & Plan Note (Signed)
Controlled.  Continue metoprolol and chlorthalidone.  See notes on labs.

## 2020-06-06 NOTE — Assessment & Plan Note (Signed)
Advance directive- Don Hicks designated if patient were incapacitated.  

## 2020-06-06 NOTE — Assessment & Plan Note (Signed)
No recent events.  Continue Depakote.  See notes on labs.  Routine cautions given to patient.

## 2020-06-06 NOTE — Assessment & Plan Note (Signed)
Advance directive- Kevin Charles designated if patient were incapacitated.  Colonoscopy d/w pt. referral placed.   PSA pending. D/w pt.  tdap 2013 Flu shot 2021 Shingles 2021 PNA not due yet. covid vaccine 2021.

## 2020-06-06 NOTE — Assessment & Plan Note (Signed)
Compliant.  No ADE on med.  Due for labs.  No neck mass.  No dysphagia.    Continue levothyroxine.  See notes on labs.

## 2020-06-15 ENCOUNTER — Other Ambulatory Visit: Payer: Self-pay

## 2020-06-15 ENCOUNTER — Telehealth (INDEPENDENT_AMBULATORY_CARE_PROVIDER_SITE_OTHER): Payer: Self-pay | Admitting: Gastroenterology

## 2020-06-15 DIAGNOSIS — Z86010 Personal history of colonic polyps: Secondary | ICD-10-CM

## 2020-06-15 NOTE — Progress Notes (Signed)
Gastroenterology Pre-Procedure Review  Request Date: Monday 08/30/20 Requesting Physician: Dr. Tobi Bastos  PATIENT REVIEW QUESTIONS: The patient responded to the following health history questions as indicated:    1. Are you having any GI issues? no 2. Do you have a personal history of Polyps? yes (02/25/13 sessile polyp noted on colonoscopy report performed by Dr. Arlyce Dice) 3. Do you have a family history of Colon Cancer or Polyps? no 4. Diabetes Mellitus? no 5. Joint replacements in the past 12 months?no 6. Major health problems in the past 3 months?no 7. Any artificial heart valves, MVP, or defibrillator?no    MEDICATIONS & ALLERGIES:    Patient reports the following regarding taking any anticoagulation/antiplatelet therapy:   Plavix, Coumadin, Eliquis, Xarelto, Lovenox, Pradaxa, Brilinta, or Effient? no Aspirin? yes (81 mg daily)  Patient confirms/reports the following medications:  Current Outpatient Medications  Medication Sig Dispense Refill  . aspirin 81 MG tablet Take 81 mg by mouth daily.    Marland Kitchen atorvastatin (LIPITOR) 40 MG tablet TAKE 1 TABLET BY MOUTH ONCE DAILY 90 tablet 3  . Benzoyl Peroxide 10 % CREA Apply to face at night.    . chlorthalidone (HYGROTON) 25 MG tablet TAKE 1 TABLET BY MOUTH  EVERY OTHER DAY 45 tablet 3  . divalproex (DEPAKOTE ER) 500 MG 24 hr tablet TAKE 2 TABLETS BY MOUTH  TWICE DAILY, EXCEPT SUNDAY  AND WEDNESDAY ONLY TAKE 1  TABLET IN THE MORNING AND 2 TABLETS IN THE EVENING 360 tablet 3  . levothyroxine (SYNTHROID) 150 MCG tablet TAKE 1 TABLET BY MOUTH ONCE DAILY 90 tablet 3  . loratadine (CLARITIN) 10 MG tablet Take 10 mg by mouth daily as needed for allergies.    . metoprolol tartrate (LOPRESSOR) 25 MG tablet Take 1 tablet (25 mg total) by mouth daily. 90 tablet 3  . pantoprazole (PROTONIX) 40 MG tablet Take 1 tablet (40 mg total) by mouth daily. 90 tablet 3  . rizatriptan (MAXALT-MLT) 5 MG disintegrating tablet TAKE ONE (1) TABLET BY MOUTH AS NEEDED FOR  MIGRAINE; MAY REPEAT IN 2 HOURS IF NEEDED 4 tablet 12  . SAW PALMETTO, SERENOA REPENS, PO Take 1 or 2 tablets by mouth daily.     No current facility-administered medications for this visit.    Patient confirms/reports the following allergies:  Allergies  Allergen Reactions  . Flonase [Fluticasone]     nosebleeds  . Pravastatin Sodium     Aches with pravastatin but able to tolerate lipitor    No orders of the defined types were placed in this encounter.   AUTHORIZATION INFORMATION Primary Insurance: 1D#: Group #:  Secondary Insurance: 1D#: Group #:  SCHEDULE INFORMATION: Date: Monday 08/30/20 Time: Location:[facility]

## 2020-06-22 ENCOUNTER — Ambulatory Visit: Payer: Self-pay | Admitting: Urology

## 2020-06-24 ENCOUNTER — Other Ambulatory Visit: Payer: Self-pay

## 2020-06-24 ENCOUNTER — Ambulatory Visit: Payer: Medicare Other | Admitting: Urology

## 2020-06-24 ENCOUNTER — Encounter: Payer: Self-pay | Admitting: Urology

## 2020-06-24 VITALS — BP 138/91 | HR 59 | Ht 71.0 in | Wt 232.5 lb

## 2020-06-24 DIAGNOSIS — R972 Elevated prostate specific antigen [PSA]: Secondary | ICD-10-CM | POA: Diagnosis not present

## 2020-06-24 DIAGNOSIS — N410 Acute prostatitis: Secondary | ICD-10-CM | POA: Diagnosis not present

## 2020-06-24 DIAGNOSIS — R300 Dysuria: Secondary | ICD-10-CM | POA: Diagnosis not present

## 2020-06-24 LAB — BLADDER SCAN AMB NON-IMAGING

## 2020-06-24 NOTE — Patient Instructions (Addendum)
Overactive Bladder, Adult  Overactive bladder refers to a condition in which a person has a sudden need to pass urine. The person may leak urine if he or she cannot get to the bathroom fast enough (urinary incontinence). A person with this condition may also wake up several times in the night to go to the bathroom. Overactive bladder is associated with poor nerve signals between your bladder and your brain. Your bladder may get the signal to empty before it is full. You may also have very sensitive muscles that make your bladder squeeze too soon. These symptoms might interfere with daily work or social activities. What are the causes? This condition may be associated with or caused by:  Urinary tract infection.  Infection of nearby tissues, such as the prostate.  Prostate enlargement.  Surgery on the uterus or urethra.  Bladder stones, inflammation, or tumors.  Drinking too much caffeine or alcohol.  Certain medicines, especially medicines that get rid of extra fluid in the body (diuretics).  Muscle or nerve weakness, especially from: ? A spinal cord injury. ? Stroke. ? Multiple sclerosis. ? Parkinson's disease.  Diabetes.  Constipation. What increases the risk? You may be at greater risk for overactive bladder if you:  Are an older adult.  Smoke.  Are going through menopause.  Have prostate problems.  Have a neurological disease, such as stroke, dementia, Parkinson's disease, or multiple sclerosis (MS).  Eat or drink things that irritate the bladder. These include alcohol, spicy food, and caffeine.  Are overweight or obese. What are the signs or symptoms? Symptoms of this condition include:  Sudden, strong urge to urinate.  Leaking urine.  Urinating 8 or more times a day.  Waking up to urinate 2 or more times a night. How is this diagnosed? Your health care provider may suspect overactive bladder based on your symptoms. He or she will diagnose this condition  by:  A physical exam and medical history.  Blood or urine tests. You might need bladder or urine tests to help determine what is causing your overactive bladder. You might also need to see a health care provider who specializes in urinary tract problems (urologist). How is this treated? Treatment for overactive bladder depends on the cause of your condition and whether it is mild or severe. You can also make lifestyle changes at home. Options include:  Bladder training. This may include: ? Learning to control the urge to urinate by following a schedule that directs you to urinate at regular intervals (timed voiding). ? Doing Kegel exercises to strengthen your pelvic floor muscles, which support your bladder. Toning these muscles can help you control urination, even if your bladder muscles are overactive.  Special devices. This may include: ? Biofeedback, which uses sensors to help you become aware of your body's signals. ? Electrical stimulation, which uses electrodes placed inside the body (implanted) or outside the body. These electrodes send gentle pulses of electricity to strengthen the nerves or muscles that control the bladder. ? Women may use a plastic device that fits into the vagina and supports the bladder (pessary).  Medicines. ? Antibiotics to treat bladder infection. ? Antispasmodics to stop the bladder from releasing urine at the wrong time. ? Tricyclic antidepressants to relax bladder muscles. ? Injections of botulinum toxin type A directly into the bladder tissue to relax bladder muscles.  Lifestyle changes. This may include: ? Weight loss. Talk to your health care provider about weight loss methods that would work best for you. ?   Diet changes. This may include reducing how much alcohol and caffeine you consume, or drinking fluids at different times of the day. ? Not smoking. Do not use any products that contain nicotine or tobacco, such as cigarettes and e-cigarettes. If  you need help quitting, ask your health care provider.  Surgery. ? A device may be implanted to help manage the nerve signals that control urination. ? An electrode may be implanted to stimulate electrical signals in the bladder. ? A procedure may be done to change the shape of the bladder. This is done only in very severe cases. Follow these instructions at home: Lifestyle  Make any diet or lifestyle changes that are recommended by your health care provider. These may include: ? Drinking less fluid or drinking fluids at different times of the day. ? Cutting down on caffeine or alcohol. ? Doing Kegel exercises. ? Losing weight if needed. ? Eating a healthy and balanced diet to prevent constipation. This may include:  Eating foods that are high in fiber, such as fresh fruits and vegetables, whole grains, and beans.  Limiting foods that are high in fat and processed sugars, such as fried and sweet foods. General instructions  Take over-the-counter and prescription medicines only as told by your health care provider.  If you were prescribed an antibiotic medicine, take it as told by your health care provider. Do not stop taking the antibiotic even if you start to feel better.  Use any implants or pessary as told by your health care provider.  If needed, wear pads to absorb urine leakage.  Keep a journal or log to track how much and when you drink and when you feel the need to urinate. This will help your health care provider monitor your condition.  Keep all follow-up visits as told by your health care provider. This is important. Contact a health care provider if:  You have a fever.  Your symptoms do not get better with treatment.  Your pain and discomfort get worse.  You have more frequent urges to urinate. Get help right away if:  You are not able to control your bladder. Summary  Overactive bladder refers to a condition in which a person has a sudden need to pass  urine.  Several conditions may lead to an overactive bladder.  Treatment for overactive bladder depends on the cause and severity of your condition.  Follow your health care provider's instructions about lifestyle changes, doing Kegel exercises, keeping a journal, and taking medicines. This information is not intended to replace advice given to you by your health care provider. Make sure you discuss any questions you have with your health care provider. Document Revised: 09/26/2018 Document Reviewed: 06/21/2017 Elsevier Patient Education  [place]. Prostate Cancer Screening  Prostate cancer screening is a test that is done to check for the presence of prostate cancer in men. The prostate gland is a walnut-sized gland that is located below the bladder and in front of the rectum in males. The function of the prostate is to add fluid to semen during ejaculation. Prostate cancer is the second most common type of cancer in men. Who should have prostate cancer screening?  Screening recommendations vary based on age and other risk factors. Screening is recommended if:  You are older than age 61. If you are age 79-69, talk with your health care provider about your need for screening and how often screening should be done. Because most prostate cancers are slow growing and will  not cause death, screening is generally reserved in this age group for men who have a 10-15-year life expectancy.  You are younger than age 95, and you have these risk factors: ? Being a black male or a male of African descent. ? Having a father, brother, or uncle who has been diagnosed with prostate cancer. The risk is higher if your family member's cancer occurred at an early age. Screening is not recommended if:  You are younger than age 15.  You are between the ages of 35 and 65 and you have no risk factors.  You are 75 years of age or older. At this age, the risks that screening can cause are greater than the  benefits that it may provide. If you are at high risk for prostate cancer, your health care provider may recommend that you have screenings more often or that you start screening at a younger age. How is screening for prostate cancer done? The recommended prostate cancer screening test is a blood test called the prostate-specific antigen (PSA) test. PSA is a protein that is made in the prostate. As you age, your prostate naturally produces more PSA. Abnormally high PSA levels may be caused by:  Prostate cancer.  An enlarged prostate that is not caused by cancer (benign prostatic hyperplasia, BPH). This condition is very common in older men.  A prostate gland infection (prostatitis). Depending on the PSA results, you may need more tests, such as:  A physical exam to check the size of your prostate gland.  Blood and imaging tests.  A procedure to remove tissue samples from your prostate gland for testing (biopsy). What are the benefits of prostate cancer screening?  Screening can help to identify cancer at an early stage, before symptoms start and when the cancer can be treated more easily.  There is a small chance that screening may lower your risk of dying from prostate cancer. The chance is small because prostate cancer is a slow-growing cancer, and most men with prostate cancer die from a different cause. What are the risks of prostate cancer screening? The main risk of prostate cancer screening is diagnosing and treating prostate cancer that would never have caused any symptoms or problems. This is called overdiagnosisand overtreatment. PSA screening cannot tell you if your PSA is high due to cancer or a different cause. A prostate biopsy is the only procedure to diagnose prostate cancer. Even the results of a biopsy may not tell you if your cancer needs to be treated. Slow-growing prostate cancer may not need any treatment other than monitoring, so diagnosing and treating it may cause  unnecessary stress or other side effects. A prostate biopsy may also cause:  Infection or fever.  A false negative. This is a result that shows that you do not have prostate cancer when you actually do have prostate cancer. Questions to ask your health care provider  When should I start prostate cancer screening?  What is my risk for prostate cancer?  How often do I need screening?  What type of screening tests do I need?  How do I get my test results?  What do my results mean?  Do I need treatment? Where to find more information  The American Cancer Society: www.cancer.org  American Urological Association: www.auanet.org Contact a health care provider if:  You have difficulty urinating.  You have pain when you urinate or ejaculate.  You have blood in your urine or semen.  You have pain in your back  or in the area of your prostate. Summary  Prostate cancer is a common type of cancer in men. The prostate gland is located below the bladder and in front of the rectum. This gland adds fluid to semen during ejaculation.  Prostate cancer screening may identify cancer at an early stage, when the cancer can be treated more easily.  The prostate-specific antigen (PSA) test is the recommended screening test for prostate cancer.  Discuss the risks and benefits of prostate cancer screening with your health care provider. If you are age 58 or older, the risks that screening can cause are greater than the benefits that it may provide. This information is not intended to replace advice given to you by your health care provider. Make sure you discuss any questions you have with your health care provider. Document Revised: 01/16/2019 Document Reviewed: 01/16/2019 Elsevier Patient Education  2020 [employer].  Prostate Biopsy Instructions  Stop all aspirin or blood thinners (aspirin, plavix, coumadin, warfarin, motrin, ibuprofen, advil, aleve, naproxen, naprosyn) for 7 days prior to  the procedure.  If you have any questions about stopping these medications, please contact your primary care physician or cardiologist.  Having a light meal prior to the procedure is recommended.  If you are diabetic or have low blood sugar please bring a small snack or glucose tablet.  A Fleets enema is needed to be purchased over the counter at a local pharmacy and used 2 hours before you scheduled appointment.  This can be purchased over the counter at any pharmacy.  Antibiotics will be administered in the clinic at the time of the procedure unless otherwise specified.    Please bring someone with you to the procedure to drive you home.  A follow up appointment has been scheduled for you to receive the results of the biopsy.  If you have any questions or concerns, please feel free to call the office at [phone] or send a Mychart message.    Thank you, Staff at [facility]
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
## 2020-06-25 LAB — URINALYSIS, COMPLETE
Bilirubin, UA: NEGATIVE
Glucose, UA: NEGATIVE
Leukocytes,UA: NEGATIVE
Nitrite, UA: NEGATIVE
Protein,UA: NEGATIVE
Specific Gravity, UA: 1.025 (ref 1.005–1.030)
Urobilinogen, Ur: 0.2 mg/dL (ref 0.2–1.0)
pH, UA: 6 (ref 5.0–7.5)

## 2020-06-25 LAB — PSA TOTAL (REFLEX TO FREE): Prostate Specific Ag, Serum: 2.4 ng/mL (ref 0.0–4.0)

## 2020-06-25 LAB — MICROSCOPIC EXAMINATION
Bacteria, UA: NONE SEEN
Epithelial Cells (non renal): NONE SEEN /hpf (ref 0–10)

## 2020-06-28 ENCOUNTER — Telehealth: Payer: Self-pay

## 2020-06-28 DIAGNOSIS — R972 Elevated prostate specific antigen [PSA]: Secondary | ICD-10-CM

## 2020-06-28 LAB — CULTURE, URINE COMPREHENSIVE

## 2020-06-28 NOTE — Telephone Encounter (Signed)
Patient notified and apts scheduled

## 2020-06-28 NOTE — Telephone Encounter (Signed)
PSA normal, follow up one year with pSA prior  Nickolas Madrid, MD 06/28/2020

## 2020-06-28 NOTE — Telephone Encounter (Signed)
Patient called wanting to know PSA result, result given when should patient follow up? Please advise

## 2020-08-19 ENCOUNTER — Other Ambulatory Visit: Payer: Self-pay | Admitting: Cardiovascular Disease

## 2020-08-19 DIAGNOSIS — I10 Essential (primary) hypertension: Secondary | ICD-10-CM

## 2020-08-26 ENCOUNTER — Other Ambulatory Visit: Payer: Self-pay

## 2020-08-26 ENCOUNTER — Other Ambulatory Visit
Admission: RE | Admit: 2020-08-26 | Discharge: 2020-08-26 | Disposition: A | Discharge status: Home / Self Care | Payer: Medicare Other | Source: Ambulatory Visit | Attending: Gastroenterology | Admitting: Gastroenterology

## 2020-08-26 DIAGNOSIS — Z20822 Contact with and (suspected) exposure to covid-19: Secondary | ICD-10-CM | POA: Insufficient documentation

## 2020-08-26 DIAGNOSIS — Z01812 Encounter for preprocedural laboratory examination: Secondary | ICD-10-CM | POA: Insufficient documentation

## 2020-08-26 LAB — SARS CORONAVIRUS 2 (TAT 6-24 HRS): SARS Coronavirus 2: NEGATIVE

## 2020-08-30 ENCOUNTER — Encounter: Payer: Self-pay | Admitting: Gastroenterology

## 2020-08-30 ENCOUNTER — Encounter
Admission: RE | Disposition: A | Discharge status: Home / Self Care | Payer: Self-pay | Source: Home / Self Care | Attending: Gastroenterology

## 2020-08-30 ENCOUNTER — Ambulatory Visit
Admission: RE | Admit: 2020-08-30 | Discharge: 2020-08-30 | Disposition: A | Discharge status: Home / Self Care | Payer: Medicare Other | Attending: Gastroenterology | Admitting: Gastroenterology

## 2020-08-30 ENCOUNTER — Ambulatory Visit: Payer: Medicare Other | Admitting: Anesthesiology

## 2020-08-30 ENCOUNTER — Other Ambulatory Visit: Payer: Self-pay

## 2020-08-30 DIAGNOSIS — Z7982 Long term (current) use of aspirin: Secondary | ICD-10-CM | POA: Diagnosis not present

## 2020-08-30 DIAGNOSIS — Z833 Family history of diabetes mellitus: Secondary | ICD-10-CM | POA: Diagnosis not present

## 2020-08-30 DIAGNOSIS — Z8249 Family history of ischemic heart disease and other diseases of the circulatory system: Secondary | ICD-10-CM | POA: Insufficient documentation

## 2020-08-30 DIAGNOSIS — K5730 Diverticulosis of large intestine without perforation or abscess without bleeding: Secondary | ICD-10-CM | POA: Insufficient documentation

## 2020-08-30 DIAGNOSIS — Z823 Family history of stroke: Secondary | ICD-10-CM | POA: Insufficient documentation

## 2020-08-30 DIAGNOSIS — D122 Benign neoplasm of ascending colon: Secondary | ICD-10-CM | POA: Insufficient documentation

## 2020-08-30 DIAGNOSIS — Z8349 Family history of other endocrine, nutritional and metabolic diseases: Secondary | ICD-10-CM | POA: Insufficient documentation

## 2020-08-30 DIAGNOSIS — Z87891 Personal history of nicotine dependence: Secondary | ICD-10-CM | POA: Diagnosis not present

## 2020-08-30 DIAGNOSIS — Z1211 Encounter for screening for malignant neoplasm of colon: Secondary | ICD-10-CM | POA: Insufficient documentation

## 2020-08-30 DIAGNOSIS — E039 Hypothyroidism, unspecified: Secondary | ICD-10-CM | POA: Diagnosis not present

## 2020-08-30 DIAGNOSIS — Z79899 Other long term (current) drug therapy: Secondary | ICD-10-CM | POA: Insufficient documentation

## 2020-08-30 DIAGNOSIS — Z888 Allergy status to other drugs, medicaments and biological substances status: Secondary | ICD-10-CM | POA: Diagnosis not present

## 2020-08-30 DIAGNOSIS — Z8042 Family history of malignant neoplasm of prostate: Secondary | ICD-10-CM | POA: Insufficient documentation

## 2020-08-30 DIAGNOSIS — K219 Gastro-esophageal reflux disease without esophagitis: Secondary | ICD-10-CM | POA: Diagnosis not present

## 2020-08-30 DIAGNOSIS — K635 Polyp of colon: Secondary | ICD-10-CM | POA: Diagnosis not present

## 2020-08-30 DIAGNOSIS — Z820 Family history of epilepsy and other diseases of the nervous system: Secondary | ICD-10-CM | POA: Insufficient documentation

## 2020-08-30 DIAGNOSIS — Z79890 Hormone replacement therapy (postmenopausal): Secondary | ICD-10-CM | POA: Diagnosis not present

## 2020-08-30 DIAGNOSIS — Z86010 Personal history of colonic polyps: Secondary | ICD-10-CM | POA: Insufficient documentation

## 2020-08-30 DIAGNOSIS — E785 Hyperlipidemia, unspecified: Secondary | ICD-10-CM | POA: Diagnosis not present

## 2020-08-30 DIAGNOSIS — Z85828 Personal history of other malignant neoplasm of skin: Secondary | ICD-10-CM | POA: Diagnosis not present

## 2020-08-30 DIAGNOSIS — I10 Essential (primary) hypertension: Secondary | ICD-10-CM | POA: Diagnosis not present

## 2020-08-30 HISTORY — PX: COLONOSCOPY WITH PROPOFOL: SHX5780

## 2020-08-30 SURGERY — COLONOSCOPY WITH PROPOFOL
Anesthesia: General

## 2020-08-30 MED ORDER — PROPOFOL 10 MG/ML IV BOLUS
INTRAVENOUS | Status: DC | PRN
  Administered 2020-08-30: 80 mg via INTRAVENOUS

## 2020-08-30 MED ORDER — PROPOFOL 500 MG/50ML IV EMUL
INTRAVENOUS | Status: DC | PRN
  Administered 2020-08-30: 200 ug/kg/min via INTRAVENOUS

## 2020-08-30 MED ORDER — SODIUM CHLORIDE 0.9 % IV SOLN: INTRAVENOUS | Status: DC
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
## 2020-08-30 NOTE — Anesthesia Postprocedure Evaluation (Signed)
Anesthesia Post Note  Patient: Kevin Charles  Procedure(s) Performed: COLONOSCOPY WITH PROPOFOL (N/A )  Patient location during evaluation: Endoscopy Anesthesia Type: General Level of consciousness: awake and alert Pain management: pain level controlled Vital Signs Assessment: post-procedure vital signs reviewed and stable Respiratory status: spontaneous breathing, nonlabored ventilation, respiratory function stable and patient connected to nasal cannula oxygen Cardiovascular status: blood pressure returned to baseline and stable Postop Assessment: no apparent nausea or vomiting Anesthetic complications: no   No complications documented.   Last Vitals:  Vitals:   08/30/20 1100 08/30/20 1110  BP: 136/87 139/85  Pulse: (!) 53 (!) 50  Resp: 14 12  Temp:    SpO2: 100% 100%    Last Pain:  Vitals:   08/30/20 1040  TempSrc: Temporal  PainSc:                  Precious Haws [place]

## 2020-08-30 NOTE — Transfer of Care (Signed)
Immediate Anesthesia Transfer of Care Note  Patient: Kevin Charles  Procedure(s) Performed: COLONOSCOPY WITH PROPOFOL (N/A )  Patient Location: PACU  Anesthesia Type:General  Level of Consciousness: sedated  Airway & Oxygen Therapy: Patient Spontanous Breathing and Patient connected to nasal cannula oxygen  Post-op Assessment: Report given to RN and Post -op Vital signs reviewed and stable  Post vital signs: Reviewed and stable  Last Vitals:  Vitals Value Taken Time  BP 119/70 08/30/20 1042  Temp 36.2 C 08/30/20 1040  Pulse 55 08/30/20 1043  Resp 15 08/30/20 1043  SpO2 99 % 08/30/20 1043  Vitals shown include unvalidated device data.  Last Pain:  Vitals:   08/30/20 1040  TempSrc: Temporal  PainSc:          Complications: No complications documented.

## 2020-08-30 NOTE — Op Note (Signed)
[facility] Patient Name: Coe Angelos Procedure Date: 08/30/2020 10:12 AM MRN: [id number] Account #: [id number] Date of Birth: [birth year] Admit Type: Outpatient Age: 60 Room: [facility] END[facility] ROOM 4 Gender: Male Note Status: Finalized Procedure:             Colonoscopy Indications:           High risk colon cancer surveillance: Personal history                         of colonic polyps Providers:             Jonathon Bellows MD, MD Referring MD:          Elveria Rising. Damita Dunnings, MD (Referring MD) Medicines:             Monitored Anesthesia Care Complications:         No immediate complications. Procedure:             Pre-Anesthesia Assessment:                        - Prior to the procedure, a History and Physical was                         performed, and patient medications, allergies and                         sensitivities were reviewed. The patient's tolerance                         of previous anesthesia was reviewed.                        - The risks and benefits of the procedure and the                         sedation options and risks were discussed with the                         patient. All questions were answered and informed                         consent was obtained.                        - ASA Grade Assessment: II - A patient with mild                         systemic disease.                        After obtaining informed consent, the colonoscope was                         passed under direct vision. Throughout the procedure,                         the patient's blood pressure, pulse, and oxygen                         saturations were monitored continuously. The  Colonoscope was introduced through the anus and                         advanced to the the cecum, identified by the                         appendiceal orifice. The colonoscopy was performed                         with ease. The patient tolerated  the procedure well.                         The quality of the bowel preparation was excellent. Findings:      The perianal and digital rectal examinations were normal.      Three sessile polyps were found in the ascending colon. The polyps were       4 to 5 mm in size. These polyps were removed with a cold snare.       Resection and retrieval were complete.      A 4 mm polyp was found in the sigmoid colon. The polyp was sessile. The       polyp was removed with a cold snare. Resection and retrieval were       complete.      Multiple small-mouthed diverticula were found in the sigmoid colon.      The exam was otherwise without abnormality on direct and retroflexion       views. Impression:            - Three 4 to 5 mm polyps in the ascending colon,                         removed with a cold snare. Resected and retrieved.                        - One 4 mm polyp in the sigmoid colon, removed with a                         cold snare. Resected and retrieved.                        - Diverticulosis in the sigmoid colon.                        - The examination was otherwise normal on direct and                         retroflexion views. Recommendation:        - Discharge patient to home (with escort).                        - Resume previous diet.                        - Continue present medications.                        - Repeat colonoscopy for surveillance based on  pathology results. Procedure Code(s):     --- Professional ---                        [phone], Colonoscopy, flexible; with removal of                         tumor(s), polyp(s), or other lesion(s) by snare                         technique Diagnosis Code(s):     --- Professional ---                        K63.5, Polyp of colon                        Z86.010, Personal history of colonic polyps                        K57.30, Diverticulosis of large intestine without                          perforation or abscess without bleeding CPT copyright 2019 American Medical Association. All rights reserved. The codes documented in this report are preliminary and upon coder review may  be revised to meet current compliance requirements. Jonathon Bellows, MD Jonathon Bellows MD, MD 08/30/2020 10:42:37 AM This report has been signed electronically. Number of Addenda: 0 Note Initiated On: 08/30/2020 10:12 AM Scope Withdrawal Time: 0 hours 8 minutes 44 seconds  Total Procedure Duration: 0 hours 22 minutes 46 seconds  Estimated Blood Loss:  Estimated blood loss: none.      [facility]

## 2020-08-30 NOTE — Anesthesia Procedure Notes (Signed)
Date/Time: 08/30/2020 10:19 AM Performed by: Nelda Marseille, CRNA Pre-anesthesia Checklist: Patient identified, Emergency Drugs available, Suction available, Patient being monitored and Timeout performed Oxygen Delivery Method: Nasal cannula

## 2020-08-31 ENCOUNTER — Encounter: Payer: Self-pay | Admitting: Gastroenterology

## 2020-08-31 LAB — SURGICAL PATHOLOGY

## 2020-09-08 ENCOUNTER — Encounter: Payer: Self-pay | Admitting: Gastroenterology

## 2020-10-22 ENCOUNTER — Telehealth: Payer: Self-pay

## 2020-10-22 NOTE — Telephone Encounter (Signed)
Patient stated that two days ago he began experiencing cough, chest congestion, sore throat, and his upper gums are sore. Patient stated that he was tested for Covid this morning and is waiting on the results, which would take 24-48 hours. Patient was unsure if he had a fever, but denied, SOB, chest pain, chills, N/V/D or any other acute symptoms. Advised patient that while awaiting test results he should quarantine, drink plenty of fluids, rest, and eat a well balanced diet. UC and ED precautions given to patient who verbalized understanding.

## 2020-10-24 NOTE — Telephone Encounter (Signed)
Please get update on patient on Monday.  Thanks. 

## 2020-10-25 ENCOUNTER — Encounter: Payer: Self-pay | Admitting: Primary Care

## 2020-10-25 ENCOUNTER — Telehealth (INDEPENDENT_AMBULATORY_CARE_PROVIDER_SITE_OTHER): Payer: Medicare Other | Admitting: Primary Care

## 2020-10-25 DIAGNOSIS — R059 Cough, unspecified: Secondary | ICD-10-CM | POA: Diagnosis not present

## 2020-10-25 MED ORDER — GUAIFENESIN-CODEINE 100-10 MG/5ML PO SYRP: 5.0000 mL | ORAL_SOLUTION | Freq: Three times a day (TID) | ORAL | 0 refills | Status: DC | PRN

## 2020-10-25 NOTE — Patient Instructions (Signed)
You may take the cough suppressant every 8 hours as needed for cough and rest. Caution this medication contains codeine which may cause drowsiness.   Please call us Thursday this week if your symptoms have not improved.   It was a pleasure meeting you!

## 2020-10-25 NOTE — Telephone Encounter (Signed)
[facility] Night - Client Nonclinical Telephone Record  [facility] Client [facility] Night - Client Client Site [facility] Physician Renford Dills - MD Contact Type Call Who Is Calling Patient / Member / Family / Caregiver Caller Name Youcef Klas Caller Phone Number [phone] Patient Name Kevin Charles Patient DOB 1961/06/15 Call Type Message Only Information Provided Reason for Call Request for General Office Information Initial Comment Caller states he did the covid test yesterday, he just got his results and they are negative. He was informed to call when he received his results. He has been taking cough medication. Additional Comment Cell number [phone]. First number is his home number. Just has a cough, but feels better today. Caller declined triage and office hours were provided. Caller is requesting a call back. Disp. Time Disposition Final User 10/23/2020 8:40:53 AM General Information Provided Yes Schlegelmilch, Apolonio Schneiders Call Closed By: Apolonio Schneiders Schlegelmilch Transaction Date/Time: 10/23/2020 8:35:25 AM (ET)

## 2020-10-25 NOTE — Telephone Encounter (Signed)
Spoke with patient and he is doing about the same as before. He just has cough and fatigue. Robitussin AC was sent to the pharmacy today and patient has picked it up already and started taking it. Patient was advised by Allie Bossier, NP if no better by Thursday to call the office back and patient agrees to do so.

## 2020-10-25 NOTE — Progress Notes (Signed)
Patient ID: Kevin Charles, male    DOB: [birth year], 60 y.o.   MRN: [id number]  Virtual visit completed through [place], a video enabled telemedicine application. Due to national recommendations of social distancing due to COVID-19, a virtual visit is felt to be most appropriate for this patient at this time. Reviewed limitations, risks, security and privacy concerns of performing a virtual visit and the availability of in person appointments. I also reviewed that there may be a patient responsible charge related to this service. The patient agreed to proceed.   The patient could not enable his camera so our visit was conducted via phone which lasted 10 min.  Patient location: home Provider location: [place] at [facility], office Persons participating in this virtual visit: patient, provider   If any vitals were documented, they were collected by patient at home unless specified below.    Ht 5\' 10"  (1.778 m)   Wt 224 lb 6.4 oz (101.8 kg)   BMI 32.20 kg/m    CC: Cough Subjective:   HPI: Kevin Charles is a 60 y.o. male patient of Dr. Damita Dunnings with a history of tobacco abuse (quit in 1989), CAD, migraines, GERD, hypothyroidism presenting on 10/25/2020 for cough.   His cough began four days ago. He was at a church volunteering all last week, no one  started feeling bad a few days after at church. He presented to a drive through Covid testing center three days ago, results returned negative two days ago.   His cough is productive at times with yellow sputum, sore throat since cough. He woke up last night with sweats, felt feverish a few times with 99.0 temperature. Overall he's feeling well except for coughing spells.   He's been taking Robitussin for a few days with improvement in congestion. His cough is worse at night.       Relevant past medical, surgical, family and social history reviewed and updated as indicated. Interim medical history since our last visit  reviewed. Allergies and medications reviewed and updated. Outpatient Medications Prior to Visit  Medication Sig Dispense Refill  . aspirin 81 MG tablet Take 81 mg by mouth daily.    Marland Kitchen atorvastatin (LIPITOR) 40 MG tablet TAKE 1 TABLET BY MOUTH ONCE DAILY 90 tablet 3  . Benzoyl Peroxide 10 % CREA Apply to face at night.    . chlorthalidone (HYGROTON) 25 MG tablet TAKE 1 TABLET BY MOUTH  EVERY OTHER DAY 45 tablet 2  . divalproex (DEPAKOTE ER) 500 MG 24 hr tablet TAKE 2 TABLETS BY MOUTH  TWICE DAILY, EXCEPT SUNDAY  AND WEDNESDAY ONLY TAKE 1  TABLET IN THE MORNING AND 2 TABLETS IN THE EVENING 360 tablet 3  . levothyroxine (SYNTHROID) 150 MCG tablet TAKE 1 TABLET BY MOUTH ONCE DAILY 90 tablet 3  . loratadine (CLARITIN) 10 MG tablet Take 10 mg by mouth daily as needed for allergies.    . metoprolol tartrate (LOPRESSOR) 25 MG tablet Take 1 tablet (25 mg total) by mouth daily. 90 tablet 3  . pantoprazole (PROTONIX) 40 MG tablet Take 1 tablet (40 mg total) by mouth daily. 90 tablet 3  . rizatriptan (MAXALT-MLT) 5 MG disintegrating tablet TAKE ONE (1) TABLET BY MOUTH AS NEEDED FOR MIGRAINE; MAY REPEAT IN 2 HOURS IF NEEDED 4 tablet 12  . SAW PALMETTO, SERENOA REPENS, PO Take 1 or 2 tablets by mouth daily.     No facility-administered medications prior to visit.     Per HPI unless specifically  indicated in ROS section below Review of Systems  Constitutional: Positive for fever.  HENT: Positive for congestion and sore throat.   Respiratory: Positive for cough.    Objective:  Ht 5\' 10"  (1.778 m)   Wt 224 lb 6.4 oz (101.8 kg)   BMI 32.20 kg/m   Wt Readings from Last 3 Encounters:  10/25/20 224 lb 6.4 oz (101.8 kg)  08/30/20 229 lb (103.9 kg)  06/24/20 232 lb 8 oz (105.5 kg)       Physical exam: Gen: alert, NAD, not ill appearing  Pulm: speaks in complete sentences without increased work of breathing, no cough during visit  Psych: normal mood, normal thought content      Results for orders  placed or performed during the hospital encounter of 08/30/20  Surgical pathology  Result Value Ref Range   SURGICAL PATHOLOGY      SURGICAL PATHOLOGY CASE: [id number] PATIENT: Brayton Mars Surgical Pathology Report     Specimen Submitted: A. Colon polyp x3, ascending; cold snare B. Colon polyp, sigmoid; cold snare  Clinical History: Personal history of colon polyps.  Colon polyps    DIAGNOSIS: A. COLON POLYPS X3, ASCENDING; COLD SNARE: - FRAGMENTS (X2) OF SESSILE SERRATED POLYPS. - SINGLE FRAGMENT OF TUBULAR ADENOMA. - NEGATIVE FOR HIGH-GRADE DYSPLASIA AND MALIGNANCY.  B. COLON POLYP, SIGMOID; COLD SNARE: - MINUTE HYPERPLASTIC POLYP. - NEGATIVE FOR DYSPLASIA AND MALIGNANCY.  Comment: Multiple additional deeper recut levels were examined.  GROSS DESCRIPTION: A. Labeled: Ascending colon polyp cold snare x3 Received: Formalin Collection time: 10:21 AM on 08/30/2020 Placed into formalin time: 10:21 AM on 08/30/2020 Tissue fragment(s): Multiple Size: Aggregate, 1.2 x 0.9 x 0.2 cm Description: Received are fragments of tan-pink soft tissue admixed with intestinal debris.  The rat io of soft tissue to intestinal debris is 60: 40. Entirely submitted in 1 cassette.  B. Labeled: Sigmoid colon polyp cold snare Received: Formalin Collection time: 10:35 AM on 08/30/2020 Placed into formalin time: 10:35 AM on 08/30/2020 Tissue fragment(s): Multiple Size: Aggregate, 1.2 x 0.9 x 0.2 cm Description: Received is a single fragment of tan-pink soft tissue admixed with intestinal debris.  The ratio of soft tissue to intestinal debris is 50: 50. Entirely submitted in 1 cassette.  RB 08/30/2020   Final Diagnosis performed by Allena Napoleon, MD.   Electronically signed 08/31/2020 12:40:38PM The electronic signature indicates that the named Attending Pathologist has evaluated the specimen Technical component performed at [facility], [address] [place], [place] [address] Lab:  [phone] Dir: Rush Farmer, MD, MMM  Professional component performed at [facility], [facility], [place], [place], [place] [address] Lab: [phone] Dir: Val Eagle a C. Rubinas, MD    Assessment & Plan:   Problem List Items Addressed This Visit      Other   Cough    Acute viral sounding symptoms x 4 days, negative Covid test 3 days ago.   Recommended retesting for Covid in our office as he likely tested too soon, he declines.  We discussed viral etiology, conservative treatment. Will provide cheratussin to take every 8 hours if needed, drowsiness precautions provided.  He will update later this week if symptoms are not improved. He sounded well over the phone, no cough or distress.       Relevant Medications   guaiFENesin-codeine (ROBITUSSIN AC) 100-10 MG/5ML syrup       Meds ordered this encounter  Medications  . guaiFENesin-codeine (ROBITUSSIN AC) 100-10 MG/5ML syrup    Sig: Take 5 mLs by  mouth 3 (three) times daily as needed for cough.    Dispense:  75 mL    Refill:  0    Order Specific Question:   Supervising Provider    Answer:   BEDSOLE, AMY E [[id number]]   No orders of the defined types were placed in this encounter.   I discussed the assessment and treatment plan with the patient. The patient was provided an opportunity to ask questions and all were answered. The patient agreed with the plan and demonstrated an understanding of the instructions. The patient was advised to call back or seek an in-person evaluation if the symptoms worsen or if the condition fails to improve as anticipated.  Follow up plan:  You may take the cough suppressant every 8 hours as needed for cough and rest. Caution this medication contains codeine which may cause drowsiness.   Please call us Thursday this week if your symptoms have not improved.   It was a pleasure meeting you!   Pleas Koch, NP

## 2020-10-25 NOTE — Assessment & Plan Note (Signed)
Acute viral sounding symptoms x 4 days, negative Covid test 3 days ago.   Recommended retesting for Covid in our office as he likely tested too soon, he declines.  We discussed viral etiology, conservative treatment. Will provide cheratussin to take every 8 hours if needed, drowsiness precautions provided.  He will update later this week if symptoms are not improved. He sounded well over the phone, no cough or distress.

## 2020-10-28 ENCOUNTER — Telehealth: Payer: Self-pay

## 2020-10-28 DIAGNOSIS — M109 Gout, unspecified: Secondary | ICD-10-CM | POA: Diagnosis not present

## 2020-10-28 DIAGNOSIS — J0100 Acute maxillary sinusitis, unspecified: Secondary | ICD-10-CM | POA: Diagnosis not present

## 2020-10-28 NOTE — Telephone Encounter (Signed)
I spoke with pt; pt is presently at [facility] to be evaluated and any needed testing. Pt said he thinks he needs abx. Had video visit on 10/25/20; pt said his toe is red and swollen and hurting. Pt is confident this is gout but pt said he has never had gout before. Pt is appreciative of cb. Sending FYI note to Dr Damita Dunnings as PCP.

## 2020-10-28 NOTE — Telephone Encounter (Signed)
[place] [facility] - Client TELEPHONE ADVICE RECORD [facility] Patient Name: Kevin Charles Gender: Male DOB: 10/26/1960 Age: 60 Y 29 M 12 D Return Phone Number: [phone] (Primary), [phone] (Secondary) Address: City/ State/ Zip: [place] [address] Client [place] [facility] - Client Client Site [place] - Day Physician Renford Dills - MD Contact Type Call Who Is Calling Patient / Member / Family / Caregiver Call Type Triage / Clinical Relationship To Patient Self Return Phone Number [phone] (Primary) Chief Complaint Walking difficulty Reason for Call Symptomatic / Request for Health Information Initial Comment Caller states she is with [facility] and has a Pt who is coughing phlegm, has a fever of 100.6 and believes he has gout in his big toe. No apts available today or tomorrow. Caller states this has been going on since Monday, is negative for Covid took the test last week. Biggest problem is sinus infection and now his right foot is sore, is concerned with gout. It is swollen and it hurts to walk. Is requesting antibiotics for the chest congestion and fever, but no fever at thte moment. Translation No Nurse Assessment Nurse: Lucky Cowboy, RN, Levada Dy Date/Time (Eastern Time): 10/28/2020 8:55:09 AM Confirm and document reason for call. If symptomatic, describe symptoms. ---Caller stated that he is having rt foot pain where the big toe is. He stated that it is painful, swelling is mild, painful to walk on it. He has sinus congestion, temp 100.6 last night. His foot is pinker than normal. He has had sinus s/s about a week. Does the patient have any new or worsening symptoms? ---Yes Will a triage be completed? ---Yes Related visit to physician within the last 2 weeks? ---No Does the PT have any chronic conditions? (i.e. diabetes, asthma, this includes High risk factors for pregnancy,  etc.) ---Yes List chronic conditions. ---seizures, thyroid, HTN, GERD, high cholesterol Is this a behavioral health or substance abuse call? ---No PLEASE NOTE: All timestamps contained within this report are represented as [place] Standard Time. CONFIDENTIALTY NOTICE: This fax transmission is intended only for the addressee. It contains information that is legally privileged, confidential or otherwise protected from use or disclosure. If you are not the intended recipient, you are strictly prohibited from reviewing, disclosing, copying using or disseminating any of this information or taking any action in reliance on or regarding this information. If you have received this fax in error, please notify us immediately by telephone so that we can arrange for its return to [place]. Phone: [phone], Toll-Free: [phone], Fax: [phone] Page: 2 of 2 Call Id: [id number] Guidelines Guideline Title Affirmed Question Affirmed Notes Nurse Date/Time Eilene Ghazi Time) Sinus Pain or Congestion Fever present > 3 days (72 hours) Dew, RN, Levada Dy 10/28/2020 9:01:57 AM Disp. Time Eilene Ghazi Time) Disposition Final User 10/28/2020 9:09:04 AM See PCP within 24 Hours Yes Dew, RN, Marin Shutter Disagree/Comply Comply Caller Understands Yes PreDisposition Call Doctor Care Advice Given Per Guideline SEE PCP WITHIN 24 HOURS: * IF OFFICE WILL BE OPEN: You need to be examined within the next 24 hours. Call your doctor (or NP/PA) when the office opens and make an appointment. * How it Helps: The salt water rinses out excess mucus and washes out any irritants (dust, allergens) that might be present. It also moistens the nasal cavity. PAIN AND FEVER MEDICINES: * For pain or fever relief, take either acetaminophen or ibuprofen. * They are over-the-counter (OTC) drugs that help treat both fever and pain. You  can buy them at the drugstore. * Treat fevers above 101 F (38.3 C). The goal of fever therapy is to bring the  fever down to a comfortable level. Remember that fever medicine usually lowers fever 2 degrees F (1 - 1 1/2 degrees C). CALL BACK IF: * Difficulty breathing (and not relieved by cleaning out nose) * You become worse CARE ADVICE given per Sinus Pain or Congestion (Adult) guideline. Comments User: Raford Pitcher, RN Date/Time Eilene Ghazi Time): 10/28/2020 9:10:11 AM No app't at the office in the time frame of triage outcome. Ins caller to go to an [place]. Referrals GO TO FACILITY UNDECIDED

## 2020-10-28 NOTE — Telephone Encounter (Signed)
Will await notes from UC.  Thanks.

## 2020-11-16 DIAGNOSIS — H43811 Vitreous degeneration, right eye: Secondary | ICD-10-CM | POA: Diagnosis not present

## 2020-12-02 ENCOUNTER — Telehealth: Payer: Self-pay

## 2020-12-02 NOTE — Telephone Encounter (Signed)
Pt was given triamcinolone 0.5% cream 15 gm tube several years ago for poison oak. He uses it often as he gets  Poison oak often. He is about to run out and asking if he can get a new rx for this sent to [address] [facility] [address]. Likes to keep it on hand.

## 2020-12-03 MED ORDER — TRIAMCINOLONE ACETONIDE 0.5 % EX CREA: 1.0000 "application " | TOPICAL_CREAM | Freq: Two times a day (BID) | CUTANEOUS | 1 refills | Status: DC | PRN

## 2020-12-03 NOTE — Addendum Note (Signed)
Addended by: Tonia Ghent on: 12/03/2020 07:46 AM   Modules accepted: Orders

## 2020-12-03 NOTE — Telephone Encounter (Signed)
Sent. Thanks.   

## 2020-12-03 NOTE — Telephone Encounter (Signed)
Patient aware rx was sent and he has already picked rx up. Advised patient if no better to let us know.

## 2020-12-07 DIAGNOSIS — H43811 Vitreous degeneration, right eye: Secondary | ICD-10-CM | POA: Diagnosis not present

## 2020-12-08 ENCOUNTER — Ambulatory Visit: Payer: Medicare Other | Admitting: Dermatology

## 2020-12-08 ENCOUNTER — Other Ambulatory Visit: Payer: Self-pay

## 2020-12-08 DIAGNOSIS — Z1283 Encounter for screening for malignant neoplasm of skin: Secondary | ICD-10-CM | POA: Diagnosis not present

## 2020-12-08 DIAGNOSIS — B353 Tinea pedis: Secondary | ICD-10-CM

## 2020-12-08 DIAGNOSIS — L578 Other skin changes due to chronic exposure to nonionizing radiation: Secondary | ICD-10-CM | POA: Diagnosis not present

## 2020-12-08 DIAGNOSIS — Z85828 Personal history of other malignant neoplasm of skin: Secondary | ICD-10-CM | POA: Diagnosis not present

## 2020-12-08 DIAGNOSIS — L570 Actinic keratosis: Secondary | ICD-10-CM | POA: Diagnosis not present

## 2020-12-08 MED ORDER — KETOCONAZOLE 2 % EX CREA: 1.0000 "application " | TOPICAL_CREAM | Freq: Every day | CUTANEOUS | 6 refills | Status: AC

## 2020-12-08 NOTE — Progress Notes (Signed)
Follow-Up Visit   Subjective  Kevin Charles is a 60 y.o. male who presents for the following: Annual Exam (History of BCC - TBSE today). The patient presents for Total-Body Skin Exam (TBSE) for skin cancer screening and mole check.  The following portions of the chart were reviewed this encounter and updated as appropriate:   Tobacco  Allergies  Meds  Problems  Med Hx  Surg Hx  Fam Hx      Review of Systems:  No other skin or systemic complaints except as noted in HPI or Assessment and Plan.  Objective  Well appearing patient in no apparent distress; mood and affect are within normal limits.  A full examination was performed including scalp, head, eyes, ears, nose, lips, neck, chest, axillae, abdomen, back, buttocks, bilateral upper extremities, bilateral lower extremities, hands, feet, fingers, toes, fingernails, and toenails. All findings within normal limits unless otherwise noted below.  Scalp, ears (10) Erythematous thin papules/macules with gritty scale.   Bilateral feet Scaliness   Assessment & Plan  AK (actinic keratosis) (10) Scalp, ears  Actinic Damage - Severe, confluent actinic changes with pre-cancerous actinic keratoses  - Severe, chronic, not at goal, secondary to cumulative UV radiation exposure over time - diffuse scaly erythematous macules and papules with underlying dyspigmentation - Discussed Prescription "Field Treatment" for Severe, Chronic Confluent Actinic Changes with Pre-Cancerous Actinic Keratoses Field treatment involves treatment of an entire area of skin that has confluent Actinic Changes (Sun/ Ultraviolet light damage) and PreCancerous Actinic Keratoses by method of PhotoDynamic Therapy (PDT) and/or prescription Topical Chemotherapy agents such as 5-fluorouracil, 5-fluorouracil/calcipotriene, and/or imiquimod.  The purpose is to decrease the number of clinically evident and subclinical PreCancerous lesions to prevent progression to  development of skin cancer by chemically destroying early precancer changes that may or may not be visible.  It has been shown to reduce the risk of developing skin cancer in the treated area. As a result of treatment, redness, scaling, crusting, and open sores may occur during treatment course. One or more than one of these methods may be used and may have to be used several times to control, suppress and eliminate the PreCancerous changes. Discussed treatment course, expected reaction, and possible side effects. - Recommend daily broad spectrum sunscreen SPF 30+ to sun-exposed areas, reapply every 2 hours as needed.  - Staying in the shade or wearing long sleeves, sun glasses (UVA+UVB protection) and wide brim hats (4-inch brim around the entire circumference of the hat) are also recommended. - Call for new or changing lesions.   - Start 5-fluorouracil/calcipotriene cream twice a day for 7 days to affected areas including scalp. Prescription sent to [facility]. Patient advised they will receive an email to purchase the medication online and have it sent to their home. Patient provided with handout reviewing treatment course and side effects and advised to call or message [place] on MyChart with any concerns.   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 To feet  Return in about 1 year  (around 12/08/2021).  I, Ashok Cordia, CMA, am acting as scribe for Sarina Ser, MD .  Documentation: I have reviewed the above documentation for accuracy and completeness, and I agree with the above.  Sarina Ser, MD

## 2020-12-08 NOTE — Patient Instructions (Addendum)
Cryotherapy Aftercare  Wash gently with soap and water everyday.   Apply Vaseline and Band-Aid daily until healed.     Wound Care Instructions  Cleanse wound gently with soap and water once a day then pat dry with clean gauze. Apply a thing coat of Petrolatum (petroleum jelly, "Vaseline") over the wound (unless you have an allergy to this). We recommend that you use a new, sterile tube of Vaseline. Do not pick or remove scabs. Do not remove the yellow or white "healing tissue" from the base of the wound.  Cover the wound with fresh, clean, nonstick gauze and secure with paper tape. You may use Band-Aids in place of gauze and tape if the would is small enough, but would recommend trimming much of the tape off as there is often too much. Sometimes Band-Aids can irritate the skin.  You should call the office for your biopsy report after 1 week if you have not already been contacted.  If you experience any problems, such as abnormal amounts of bleeding, swelling, significant bruising, significant pain, or evidence of infection, please call the office immediately.  FOR ADULT SURGERY PATIENTS: If you need something for pain relief you may take 1 extra strength Tylenol (acetaminophen) AND 2 Ibuprofen (200mg  each) together every 4 hours as needed for pain. (do not take these if you are allergic to them or if you have a reason you should not take them.) Typically, you may only need pain medication for 1 to 3 days.   Instructions for [facility] Medications  One or more of your medications was sent to the [facility] mail order [facility] pharmacy. You will receive an email from them and can purchase the medicine through that link. It will then be mailed to your home at the address you confirmed. If for any reason you do not receive an email from them, please check your spam folder. If you still do not find the email, please let us know. [facility] phone number is [phone].

## 2020-12-14 ENCOUNTER — Encounter: Payer: Self-pay | Admitting: Dermatology

## 2020-12-15 DIAGNOSIS — H43811 Vitreous degeneration, right eye: Secondary | ICD-10-CM | POA: Diagnosis not present

## 2021-01-06 ENCOUNTER — Other Ambulatory Visit: Payer: Self-pay | Admitting: Family Medicine

## 2021-01-27 DIAGNOSIS — H903 Sensorineural hearing loss, bilateral: Secondary | ICD-10-CM | POA: Diagnosis not present

## 2021-02-24 ENCOUNTER — Telehealth: Payer: Self-pay

## 2021-02-24 ENCOUNTER — Telehealth: Payer: Self-pay | Admitting: Family Medicine

## 2021-02-24 NOTE — Telephone Encounter (Signed)
[place] [facility] - Client TELEPHONE ADVICE RECORD [facility] Patient Name: Kevin Charles Gender: Male DOB: 1961-06-06 Age: 60 Y 75 M 9 D Return Phone Number: [phone] (Primary), [phone] (Secondary) Address: City/ State/ Zip: [place] [address] Client [place] [facility] - Client Client Site [place] - Day Physician Renford Dills - MD Contact Type Call Who Is Calling Patient / Member / Family / Caregiver Call Type Triage / Clinical Relationship To Patient Self Return Phone Number [phone] (Primary) Chief Complaint Health information question (non symptomatic) Reason for Call Symptomatic / Request for [place] states he has been experiencing blood pressure issues and he has not been feeling well yesterday. BP was 176/96 yesterday. Translation No Nurse Assessment Nurse: Markus Daft, RN, Sherre Poot Date/Time (Eastern Time): 02/24/2021 9:44:44 AM Confirm and document reason for call. If symptomatic, describe symptoms. ---Caller c/o HA, and states that his BP 169/92 yesterday evening. S/S started yesterday. No HA today. Unable to get his own BP cuff to work this am. Does the patient have any new or worsening symptoms? ---Yes Will a triage be completed? ---Yes Related visit to physician within the last 2 weeks? ---No Does the PT have any chronic conditions? (i.e. diabetes, asthma, this includes High risk factors for pregnancy, etc.) ---Yes List chronic conditions. ---HTN - Metoprolol and Chlorthalidone; CAD Is this a behavioral health or substance abuse call? ---No Guidelines Guideline Title Affirmed Question Affirmed Notes Nurse Date/Time (Eastern Time) Blood Pressure - High Systolic BP >= 0000000 OR Diastolic >= 123XX123 [place], RN, [place] 02/24/2021 9:51:42 AM Disp. Time Eilene Ghazi Time) Disposition Final User 02/24/2021 9:55:54 AM SEE PCP WITHIN 3 DAYS Yes Markus Daft, RN,  Sherre Poot PLEASE NOTE: All timestamps contained within this report are represented as [place] Standard Time. CONFIDENTIALTY NOTICE: This fax transmission is intended only for the addressee. It contains information that is legally privileged, confidential or otherwise protected from use or disclosure. If you are not the intended recipient, you are strictly prohibited from reviewing, disclosing, copying using or disseminating any of this information or taking any action in reliance on or regarding this information. If you have received this fax in error, please notify us immediately by telephone so that we can arrange for its return to [place]. Phone: [phone], Toll-Free: [phone], Fax: [phone] Page: 2 of 2 Call Id: [id number] [place] Disagree/Comply Comply Caller Understands Yes PreDisposition Go to Urgent Care/Walk-In Clinic Care Advice Given Per Guideline SEE PCP WITHIN 3 DAYS: * You need to be seen within 2 or 3 days. HIGH BLOOD PRESSURE: * The goal of blood pressure treatment for most people with hypertension is to keep the blood pressure under 140/90. For people that are 60 years or older, your doctor may instead want to keep the blood pressure under 150/90. * REDUCE STRESS: Find activities that help reduce your stress. Examples might include meditation, yoga, or even a restful walk in a park. CALL BACK IF: * Weakness or numbness of the face, arm or leg on one side of the body occurs * Difficulty walking, difficulty talking, or severe headache occurs * Chest pain or difficulty breathing occurs * Your blood pressure is over 180/110 * You become worse CARE ADVICE given per High Blood Pressure (Adult) guideline. Comments User: Mayford Knife, RN Date/Time Eilene Ghazi Time): 02/24/2021 9:52:45 AM He missed his BP pill on Monday or Tuesday.

## 2021-02-24 NOTE — Telephone Encounter (Signed)
I spoke with pt; pt got new batteries for BP cuff; BP today 137/80 P 63;  and again on BP 145/86 P64; pt took BP med on 02/23/21 at night. Pt has been working around [employer] and feels OK today. No H/A,dizziness CP or SOB. Pt said he thinks he got excited yesterday because he could not find his new checkbook. (Pt did find check book and is having a good day today. Pt will cb if needed. UC & ED precautions given and pt voiced understanding. Sending note to DR Damita Dunnings and Janett Billow CMA.

## 2021-02-24 NOTE — Telephone Encounter (Signed)
error 

## 2021-02-24 NOTE — Telephone Encounter (Signed)
Agree. Thanks

## 2021-03-14 ENCOUNTER — Other Ambulatory Visit: Payer: Self-pay | Admitting: Cardiovascular Disease

## 2021-03-14 DIAGNOSIS — I10 Essential (primary) hypertension: Secondary | ICD-10-CM

## 2021-04-01 ENCOUNTER — Other Ambulatory Visit: Payer: Self-pay | Admitting: Family Medicine

## 2021-04-01 ENCOUNTER — Other Ambulatory Visit: Payer: Self-pay | Admitting: Cardiovascular Disease

## 2021-04-01 DIAGNOSIS — I10 Essential (primary) hypertension: Secondary | ICD-10-CM
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
## 2021-05-19 ENCOUNTER — Ambulatory Visit: Payer: Medicare Other | Admitting: Cardiovascular Disease

## 2021-05-19 ENCOUNTER — Other Ambulatory Visit: Payer: Self-pay

## 2021-05-19 ENCOUNTER — Encounter: Payer: Self-pay | Admitting: Cardiovascular Disease

## 2021-05-19 VITALS — BP 120/78 | HR 58 | Ht 71.0 in | Wt 229.4 lb

## 2021-05-19 DIAGNOSIS — I2510 Atherosclerotic heart disease of native coronary artery without angina pectoris: Secondary | ICD-10-CM

## 2021-05-19 DIAGNOSIS — I10 Essential (primary) hypertension: Secondary | ICD-10-CM | POA: Diagnosis not present

## 2021-05-19 DIAGNOSIS — E785 Hyperlipidemia, unspecified: Secondary | ICD-10-CM | POA: Diagnosis not present

## 2021-05-19 MED ORDER — CHLORTHALIDONE 25 MG PO TABS: 25.0000 mg | ORAL_TABLET | ORAL | 3 refills | Status: DC

## 2021-05-19 NOTE — Patient Instructions (Signed)
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
## 2021-06-03 ENCOUNTER — Other Ambulatory Visit: Payer: Self-pay | Admitting: Cardiovascular Disease

## 2021-06-03 ENCOUNTER — Ambulatory Visit (INDEPENDENT_AMBULATORY_CARE_PROVIDER_SITE_OTHER): Payer: Medicare Other

## 2021-06-03 VITALS — Ht 71.0 in | Wt 225.0 lb

## 2021-06-03 DIAGNOSIS — Z0000 Encounter for general adult medical examination without abnormal findings: Secondary | ICD-10-CM

## 2021-06-03 NOTE — Patient Instructions (Signed)
Kevin Charles , Thank you for taking time to complete your Medicare Wellness Visit. I appreciate your ongoing commitment to your health goals. Please review the following plan we discussed and let me know if I can assist you in the future.   Screening recommendations/referrals: Colonoscopy: up to date, completed 08/30/20, due 08/31/23 Recommended yearly ophthalmology/optometry visit for glaucoma screening and checkup Recommended yearly dental visit for hygiene and checkup  Vaccinations: Influenza vaccine: up to date, please bring updated information to your next appointment Pneumococcal vaccine: not yet eligible Tdap vaccine: up to date , completed 02/05/12, due 02/04/22 Shingles vaccine: up to date   Covid-19:  newest booster available at your local pharmacy  Advanced directives: copy on file  Conditions/risks identified: see problem list  Next appointment: Follow up in one year for your annual wellness visit 06/07/22 @ 9:00am , this will be a telephone visit.   Preventive Care 40-64 Years, Male Preventive care refers to lifestyle choices and visits with your health care provider that can promote health and wellness. What does preventive care include? A yearly physical exam. This is also called an annual well check. Dental exams once or twice a year. Routine eye exams. Ask your health care provider how often you should have your eyes checked. Personal lifestyle choices, including: Daily care of your teeth and gums. Regular physical activity. Eating a healthy diet. Avoiding tobacco and drug use. Limiting alcohol use. Practicing safe sex. Taking low-dose aspirin every day starting at age 56. What happens during an annual well check? The services and screenings done by your health care provider during your annual well check will depend on your age, overall health, lifestyle risk factors, and family history of disease. Counseling  Your health care provider may ask you questions about  your: Alcohol use. Tobacco use. Drug use. Emotional well-being. Home and relationship well-being. Sexual activity. Eating habits. Work and work Statistician. Screening  You may have the following tests or measurements: Height, weight, and BMI. Blood pressure. Lipid and cholesterol levels. These may be checked every 5 years, or more frequently if you are over 20 years old. Skin check. Lung cancer screening. You may have this screening every year starting at age 35 if you have a 30-pack-year history of smoking and currently smoke or have quit within the past 15 years. Fecal occult blood test (FOBT) of the stool. You may have this test every year starting at age 53. Flexible sigmoidoscopy or colonoscopy. You may have a sigmoidoscopy every 5 years or a colonoscopy every 10 years starting at age 49. Prostate cancer screening. Recommendations will vary depending on your family history and other risks. Hepatitis C blood test. Hepatitis B blood test. Sexually transmitted disease (STD) testing. Diabetes screening. This is done by checking your blood sugar (glucose) after you have not eaten for a while (fasting). You may have this done every 1-3 years. Discuss your test results, treatment options, and if necessary, the need for more tests with your health care provider. Vaccines  Your health care provider may recommend certain vaccines, such as: Influenza vaccine. This is recommended every year. Tetanus, diphtheria, and acellular pertussis (Tdap, Td) vaccine. You may need a Td booster every 10 years. Zoster vaccine. You may need this after age 16. Pneumococcal 13-valent conjugate (PCV13) vaccine. You may need this if you have certain conditions and have not been vaccinated. Pneumococcal polysaccharide (PPSV23) vaccine. You may need one or two doses if you smoke cigarettes or if you have certain conditions. Talk to  your health care provider about which screenings and vaccines you need and how  often you need them. This information is not intended to replace advice given to you by your health care provider. Make sure you discuss any questions you have with your health care provider. Document Released: 07/02/2015 Document Revised: 02/23/2016 Document Reviewed: 04/06/2015 Elsevier Interactive Patient Education  2017 [employer] Prevention in the Home Falls can cause injuries. They can happen to people of all ages. There are many things you can do to make your home safe and to help prevent falls. What can I do on the outside of my home? Regularly fix the edges of walkways and driveways and fix any cracks. Remove anything that might make you trip as you walk through a door, such as a raised step or threshold. Trim any bushes or trees on the path to your home. Use bright outdoor lighting. Clear any walking paths of anything that might make someone trip, such as rocks or tools. Regularly check to see if handrails are loose or broken. Make sure that both sides of any steps have handrails. Any raised decks and porches should have guardrails on the edges. Have any leaves, snow, or ice cleared regularly. Use sand or salt on walking paths during winter. Clean up any spills in your garage right away. This includes oil or grease spills. What can I do in the bathroom? Use night lights. Install grab bars by the toilet and in the tub and shower. Do not use towel bars as grab bars. Use non-skid mats or decals in the tub or shower. If you need to sit down in the shower, use a plastic, non-slip stool. Keep the floor dry. Clean up any water that spills on the floor as soon as it happens. Remove soap buildup in the tub or shower regularly. Attach bath mats securely with double-sided non-slip rug tape. Do not have throw rugs and other things on the floor that can make you trip. What can I do in the bedroom? Use night lights. Make sure that you have a light by your bed that is easy to  reach. Do not use any sheets or blankets that are too big for your bed. They should not hang down onto the floor. Have a firm chair that has side arms. You can use this for support while you get dressed. Do not have throw rugs and other things on the floor that can make you trip. What can I do in the kitchen? Clean up any spills right away. Avoid walking on wet floors. Keep items that you use a lot in easy-to-reach places. If you need to reach something above you, use a strong step stool that has a grab bar. Keep electrical cords out of the way. Do not use floor polish or wax that makes floors slippery. If you must use wax, use non-skid floor wax. Do not have throw rugs and other things on the floor that can make you trip. What can I do with my stairs? Do not leave any items on the stairs. Make sure that there are handrails on both sides of the stairs and use them. Fix handrails that are broken or loose. Make sure that handrails are as long as the stairways. Check any carpeting to make sure that it is firmly attached to the stairs. Fix any carpet that is loose or worn. Avoid having throw rugs at the top or bottom of the stairs. If you do have throw rugs, attach  them to the floor with carpet tape. Make sure that you have a light switch at the top of the stairs and the bottom of the stairs. If you do not have them, ask someone to add them for you. What else can I do to help prevent falls? Wear shoes that: Do not have high heels. Have rubber bottoms. Are comfortable and fit you well. Are closed at the toe. Do not wear sandals. If you use a stepladder: Make sure that it is fully opened. Do not climb a closed stepladder. Make sure that both sides of the stepladder are locked into place. Ask someone to hold it for you, if possible. Clearly mark and make sure that you can see: Any grab bars or handrails. First and last steps. Where the edge of each step is. Use tools that help you move  around (mobility aids) if they are needed. These include: Canes. Walkers. Scooters. Crutches. Turn on the lights when you go into a dark area. Replace any light bulbs as soon as they burn out. Set up your furniture so you have a clear path. Avoid moving your furniture around. If any of your floors are uneven, fix them. If there are any pets around you, be aware of where they are. Review your medicines with your doctor. Some medicines can make you feel dizzy. This can increase your chance of falling. Ask your doctor what other things that you can do to help prevent falls. This information is not intended to replace advice given to you by your health care provider. Make sure you discuss any questions you have with your health care provider. Document Released: 04/01/2009 Document Revised: 11/11/2015 Document Reviewed: 07/10/2014 Elsevier Interactive Patient Education  2017 [employer].

## 2021-06-06 ENCOUNTER — Encounter: Payer: Medicare Other | Admitting: Family Medicine

## 2021-06-10 ENCOUNTER — Encounter: Payer: Self-pay | Admitting: Family Medicine

## 2021-06-10 ENCOUNTER — Other Ambulatory Visit: Payer: Self-pay

## 2021-06-10 ENCOUNTER — Ambulatory Visit (INDEPENDENT_AMBULATORY_CARE_PROVIDER_SITE_OTHER): Payer: Medicare Other | Admitting: Family Medicine

## 2021-06-10 VITALS — BP 120/80 | HR 66 | Temp 97.8°F | Ht 71.0 in | Wt 230.0 lb

## 2021-06-10 DIAGNOSIS — Z0000 Encounter for general adult medical examination without abnormal findings: Secondary | ICD-10-CM

## 2021-06-10 DIAGNOSIS — E039 Hypothyroidism, unspecified: Secondary | ICD-10-CM

## 2021-06-10 DIAGNOSIS — E785 Hyperlipidemia, unspecified: Secondary | ICD-10-CM | POA: Diagnosis not present

## 2021-06-10 DIAGNOSIS — Z125 Encounter for screening for malignant neoplasm of prostate: Secondary | ICD-10-CM

## 2021-06-10 DIAGNOSIS — I10 Essential (primary) hypertension: Secondary | ICD-10-CM | POA: Diagnosis not present

## 2021-06-10 DIAGNOSIS — G40909 Epilepsy, unspecified, not intractable, without status epilepticus: Secondary | ICD-10-CM | POA: Diagnosis not present

## 2021-06-10 DIAGNOSIS — G43909 Migraine, unspecified, not intractable, without status migrainosus: Secondary | ICD-10-CM

## 2021-06-10 DIAGNOSIS — J3489 Other specified disorders of nose and nasal sinuses: Secondary | ICD-10-CM | POA: Diagnosis not present

## 2021-06-10 DIAGNOSIS — M25519 Pain in unspecified shoulder: Secondary | ICD-10-CM

## 2021-06-10 DIAGNOSIS — Z7189 Other specified counseling: Secondary | ICD-10-CM

## 2021-06-10 LAB — PSA, MEDICARE: PSA: 3.01 ng/ml (ref 0.10–4.00)

## 2021-06-10 LAB — CBC WITH DIFFERENTIAL/PLATELET
Basophils Absolute: 0.1 10*3/uL (ref 0.0–0.1)
Basophils Relative: 0.8 % (ref 0.0–3.0)
Eosinophils Absolute: 0.1 10*3/uL (ref 0.0–0.7)
Eosinophils Relative: 2.1 % (ref 0.0–5.0)
HCT: 49.9 % (ref 39.0–52.0)
Hemoglobin: 17.5 g/dL — ABNORMAL HIGH (ref 13.0–17.0)
Lymphocytes Relative: 32.6 % (ref 12.0–46.0)
Lymphs Abs: 2.1 10*3/uL (ref 0.7–4.0)
MCHC: 35.1 g/dL (ref 30.0–36.0)
MCV: 94.4 fl (ref 78.0–100.0)
Monocytes Absolute: 0.7 10*3/uL (ref 0.1–1.0)
Monocytes Relative: 11.6 % (ref 3.0–12.0)
Neutro Abs: 3.4 10*3/uL (ref 1.4–7.7)
Neutrophils Relative %: 52.9 % (ref 43.0–77.0)
Platelets: 152 10*3/uL (ref 150.0–400.0)
RBC: 5.29 Mil/uL (ref 4.22–5.81)
RDW: 12.7 % (ref 11.5–15.5)
WBC: 6.4 10*3/uL (ref 4.0–10.5)

## 2021-06-10 LAB — COMPREHENSIVE METABOLIC PANEL
ALT: 43 U/L (ref 0–53)
AST: 35 U/L (ref 0–37)
Albumin: 4.5 g/dL (ref 3.5–5.2)
Alkaline Phosphatase: 73 U/L (ref 39–117)
BUN: 16 mg/dL (ref 6–23)
CO2: 33 mEq/L — ABNORMAL HIGH (ref 19–32)
Calcium: 10 mg/dL (ref 8.4–10.5)
Chloride: 102 mEq/L (ref 96–112)
Creatinine, Ser: 0.88 mg/dL (ref 0.40–1.50)
GFR: 93.15 mL/min (ref >60.00)
Glucose, Bld: 82 mg/dL (ref 70–99)
Potassium: 4.6 mEq/L (ref 3.5–5.1)
Sodium: 142 mEq/L (ref 135–145)
Total Bilirubin: 1.4 mg/dL — ABNORMAL HIGH (ref 0.2–1.2)
Total Protein: 7.1 g/dL (ref 6.0–8.3)

## 2021-06-10 LAB — LIPID PANEL
Cholesterol: 135 mg/dL (ref 0–200)
HDL: 33.9 mg/dL — ABNORMAL LOW (ref >39.00)
LDL Cholesterol: 74 mg/dL (ref 0–99)
NonHDL: 101.4
Total CHOL/HDL Ratio: 4
Triglycerides: 139 mg/dL (ref 0.0–149.0)
VLDL: 27.8 mg/dL (ref 0.0–40.0)

## 2021-06-10 LAB — TSH: TSH: 0.84 u[IU]/mL (ref 0.35–5.50)

## 2021-06-10 MED ORDER — AMOXICILLIN-POT CLAVULANATE 875-125 MG PO TABS: 1.0000 | ORAL_TABLET | Freq: Two times a day (BID) | ORAL | 0 refills | Status: DC

## 2021-06-10 MED ORDER — PANTOPRAZOLE SODIUM 40 MG PO TBEC: 40.0000 mg | DELAYED_RELEASE_TABLET | Freq: Every day | ORAL | 3 refills | Status: DC

## 2021-06-10 MED ORDER — DIVALPROEX SODIUM ER 500 MG PO TB24: ORAL_TABLET | ORAL | 3 refills | Status: DC

## 2021-06-10 MED ORDER — RIZATRIPTAN BENZOATE 5 MG PO TBDP: ORAL_TABLET | ORAL | 12 refills | Status: DC

## 2021-06-10 NOTE — Progress Notes (Signed)
This visit occurred during the SARS-CoV-2 public health emergency.  Safety protocols were in place, including screening questions prior to the visit, additional usage of staff PPE, and extensive cleaning of exam room while observing appropriate contact time as indicated for disinfecting solutions.  Advance directive- Jobie Quaker designated if patient were incapacitated.  Colonoscopy 2022 PSA pending.  D/w pt.  tdap 2013 Flu shot 2022 Shingles 2021 PNA not due yet.   covid vaccine 2021   Elevated Cholesterol: Using medications without problems: yes Muscle aches: no Diet compliance: yes Exercise: yes Labs pending.    He had cardiology f/u in the meantime.     Hypothyroidism.  Compliant.  No ADE on med.  Due for labs.  No neck mass.  No dysphagia.     Hypertension:               Using medication without problems or lightheadedness: yes Chest pain with exertion:no Edema: no Short of breath:no Labs pending. See notes on labs.     SZ hx noted. Compliant. No events.  He has continued for years w/o events.  FH noted, mother had SZ.  Due for labs.  See notes on labs.     He has still has some episodic migraines.  No ADE on med.  With relief with maxalt.  used episodically.     L shoulder pain with ROM, putting on a coat.  Pain sleeping on L side.  He wants to avoid surgery.  he'll update me as needed.    Maxillary pressure, post nasal gtt, going on for weeks.  Upper gum pain.  No fevers.     Meds, vitals, and allergies reviewed.    ROS: Per HPI unless specifically indicated in ROS section    GEN: nad, alert and oriented HEENT: ncat, TM wnl B, maxillary area ttp B, OP wnl NECK: supple w/o LA CV: rrr PULM: ctab, no inc wob ABD: soft, +bs EXT: no edema SKIN: well perfused.

## 2021-06-10 NOTE — Patient Instructions (Addendum)
I will update cardiology and urology.  Keep the urology appointment but cancel the lab appointment.   Start augmentin, rest and fluids, update me as needed.    Go to the lab on the way out.   If you have mychart we'll likely use that to update you.    Take care.  Glad to see you.

## 2021-06-11 LAB — VALPROIC ACID LEVEL: Valproic Acid Lvl: 114.6 mg/L — ABNORMAL HIGH (ref 50.0–100.0)

## 2021-06-14 ENCOUNTER — Telehealth: Payer: Self-pay | Admitting: Family Medicine

## 2021-06-14 NOTE — Telephone Encounter (Signed)
Spoke with patient and he is aware labs looked okay at glance and will call him back later in the week with details about results. Patient verbalized understanding.

## 2021-06-14 NOTE — Telephone Encounter (Signed)
His labs are fine. We'll send him the details as soon as we can.  Thanks.

## 2021-06-14 NOTE — Telephone Encounter (Signed)
Pt called stating that he would like to know the results of his lab work. Pt states that if they are back he would like to know today because he is a little worried. Please advise.

## 2021-06-15 DIAGNOSIS — J3489 Other specified disorders of nose and nasal sinuses: Secondary | ICD-10-CM | POA: Insufficient documentation

## 2021-06-15 NOTE — Assessment & Plan Note (Signed)
Continue as needed Maxalt. 

## 2021-06-15 NOTE — Assessment & Plan Note (Signed)
He is putting up with shoulder pain we can refer to orthopedics if he wants to in the future.  He will let me know as needed.

## 2021-06-15 NOTE — Assessment & Plan Note (Signed)
Continue levothyroxine.  See notes on labs. 

## 2021-06-15 NOTE — Assessment & Plan Note (Signed)
°  Advance directive- Jobie Quaker designated if patient were incapacitated.  Colonoscopy 2022 PSA pending.  D/w pt.  tdap 2013 Flu shot 2022 Shingles 2021 PNA not due yet.   covid vaccine 2021

## 2021-06-15 NOTE — Assessment & Plan Note (Signed)
Continue atorvastatin.  See notes on labs. 

## 2021-06-15 NOTE — Assessment & Plan Note (Signed)
Presumed sinusitis.  Start Augmentin.  Supportive care otherwise.  Nontoxic.  Okay for outpatient follow-up.  He agrees with plan.

## 2021-06-15 NOTE — Assessment & Plan Note (Signed)
No events.  Compliant.  Continue Depakote as is.  See notes on labs.

## 2021-06-15 NOTE — Assessment & Plan Note (Signed)
Advance directive- Don Hicks designated if patient were incapacitated.  

## 2021-06-15 NOTE — Assessment & Plan Note (Signed)
Continue chlorthalidone and metoprolol.  See notes on labs.

## 2021-06-21 ENCOUNTER — Other Ambulatory Visit: Payer: Self-pay

## 2021-06-28 ENCOUNTER — Other Ambulatory Visit: Payer: Self-pay

## 2021-06-28 ENCOUNTER — Ambulatory Visit: Payer: Medicare Other | Admitting: Urology

## 2021-06-28 ENCOUNTER — Encounter: Payer: Self-pay | Admitting: Urology

## 2021-06-28 VITALS — BP 135/79 | HR 59 | Ht 71.0 in | Wt 221.4 lb

## 2021-06-28 DIAGNOSIS — Z125 Encounter for screening for malignant neoplasm of prostate: Secondary | ICD-10-CM | POA: Diagnosis not present

## 2021-06-28 DIAGNOSIS — R399 Unspecified symptoms and signs involving the genitourinary system: Secondary | ICD-10-CM

## 2021-06-28 NOTE — Patient Instructions (Signed)
Prostate Cancer Screening ?Prostate cancer screening is testing that is done to check for the presence of prostate cancer in men. The prostate gland is a walnut-sized gland that is located below the bladder and in front of the rectum in males. The function of the prostate is to add fluid to semen during ejaculation. Prostate cancer is one of the most common types of cancer in men. ?Who should have prostate cancer screening? ?Screening recommendations vary based on age and other risk factors, as well as between the professional organizations who make the recommendations. ?In general, screening is recommended if: ?You are age 50 to 70 and have an average risk for prostate cancer. You should talk with your health care provider about your need for screening and how often screening should be done. Because most prostate cancers are slow growing and will not cause death, screening in this age group is generally reserved for men who have a 10- to 15-year life expectancy. ?You are younger than age 50, and you have these risk factors: ?Having a father, brother, or uncle who has been diagnosed with prostate cancer. The risk is higher if your family member's cancer occurred at an early age or if you have multiple family members with prostate cancer at an early age. ?Being a male who is Black or is of Caribbean or sub-Saharan African descent. ?In general, screening is not recommended if: ?You are younger than age 40. ?You are between the ages of 40 and 49 and you have no risk factors. ?You are 70 years of age or older. At this age, the risks that screening can cause are greater than the benefits that it may provide. ?If you are at high risk for prostate cancer, your health care provider may recommend that you have screenings more often or that you start screening at a younger age. ?How is screening for prostate cancer done? ?The recommended prostate cancer screening test is a blood test called the prostate-specific antigen (PSA)  test. PSA is a protein that is made in the prostate. As you age, your prostate naturally produces more PSA. Abnormally high PSA levels may be caused by: ?Prostate cancer. ?An enlarged prostate that is not caused by cancer (benign prostatic hyperplasia, or BPH). This condition is very common in older men. ?A prostate gland infection (prostatitis) or urinary tract infection. ?Certain medicines such as male hormones (like testosterone) or other medicines that raise testosterone levels. ?A rectal exam may be done as part of prostate cancer screening to help provide information about the size of your prostate gland. When a rectal exam is performed, it should be done after the PSA level is drawn to avoid any effect on the results. ?Depending on the PSA results, you may need more tests, such as: ?A physical exam to check the size of your prostate gland, if not done as part of screening. ?Blood and imaging tests. ?A procedure to remove tissue samples from your prostate gland for testing (biopsy). This is the only way to know for certain if you have prostate cancer. ?What are the benefits of prostate cancer screening? ?Screening can help to identify cancer at an early stage, before symptoms start and when the cancer can be treated more easily. ?There is a small chance that screening may lower your risk of dying from prostate cancer. The chance is small because prostate cancer is a slow-growing cancer, and most men with prostate cancer die from a different cause. ?What are the risks of prostate cancer screening? ?The main   risk of prostate cancer screening is diagnosing and treating prostate cancer that would never have caused any symptoms or problems. This is called overdiagnosisand overtreatment. PSA screening cannot tell you if your PSA is high due to cancer or a different cause. A prostate biopsy is the only procedure to diagnose prostate cancer. Even the results of a biopsy may not tell you if your cancer needs to be  treated. Slow-growing prostate cancer may not need any treatment other than monitoring, so diagnosing and treating it may cause unnecessary stress or other side effects. ?Questions to ask your health care provider ?When should I start prostate cancer screening? ?What is my risk for prostate cancer? ?How often do I need screening? ?What type of screening tests do I need? ?How do I get my test results? ?What do my results mean? ?Do I need treatment? ?Where to find more information ?The American Cancer Society: www.cancer.org ?American Urological Association: www.auanet.org ?Contact a health care provider if: ?You have difficulty urinating. ?You have pain when you urinate or ejaculate. ?You have blood in your urine or semen. ?You have pain in your back or in the area of your prostate. ?Summary ?Prostate cancer is a common type of cancer in men. The prostate gland is located below the bladder and in front of the rectum. This gland adds fluid to semen during ejaculation. ?Prostate cancer screening may identify cancer at an early stage, when the cancer can be treated more easily and is less likely to have spread to other areas of the body. ?The prostate-specific antigen (PSA) test is the recommended screening test for prostate cancer, but it has associated risks. ?Discuss the risks and benefits of prostate cancer screening with your health care provider. If you are age 70 or older, the risks that screening can cause are greater than the benefits that it may provide. ?This information is not intended to replace advice given to you by your health care provider. Make sure you discuss any questions you have with your health care provider. ?Document Revised: 11/29/2020 Document Reviewed: 11/29/2020 ?Elsevier Patient Education ? 2022 Elsevier Inc[employer] ? ?

## 2021-06-28 NOTE — Progress Notes (Signed)
° °  06/28/2021 12:20 PM   Kevin Charles 1961/03/20 [id number]  Reason for visit: Follow up PSA screening, urinary symptoms  HPI: 61 year old male with a long history of urinary dribbling for over 20 years that requires a pad during the day.  This is minimally bothersome to him.  Its typically postvoid urinary dribbling, and he denies stress or urge incontinence.  Urinalysis and PVR have been benign in the past.  We have followed him for PSA screening as well, and PSA had increased to 4.77 last year, but on repeat decreased to 2.4 and he opted to continue yearly screening.  PSA this year remains within the normal range at 3.01.  We again reviewed the AUA guidelines regarding PSA screening, and that the overall trend is more important than individual values.  His PSA trend is overall very reassuring, and remains within the normal range.  Recommend continuing PSA screening every 1 to 2 years per the AUA guidelines.  RTC 1 year PSA prior   Billey Co, MD  [facility] [address] [place],  [address] [phone]

## 2021-07-17 DIAGNOSIS — R059 Cough, unspecified: Secondary | ICD-10-CM | POA: Diagnosis not present

## 2021-07-17 DIAGNOSIS — Z20822 Contact with and (suspected) exposure to covid-19: Secondary | ICD-10-CM | POA: Diagnosis not present

## 2021-07-17 DIAGNOSIS — J069 Acute upper respiratory infection, unspecified: Secondary | ICD-10-CM | POA: Diagnosis not present

## 2021-07-25 ENCOUNTER — Telehealth (INDEPENDENT_AMBULATORY_CARE_PROVIDER_SITE_OTHER): Payer: Medicare Other | Admitting: Nurse Practitioner

## 2021-07-25 ENCOUNTER — Other Ambulatory Visit: Payer: Self-pay

## 2021-07-25 ENCOUNTER — Other Ambulatory Visit: Payer: Self-pay | Admitting: Internal Medicine

## 2021-07-25 ENCOUNTER — Encounter: Payer: Self-pay | Admitting: Nurse Practitioner

## 2021-07-25 ENCOUNTER — Telehealth: Payer: Self-pay | Admitting: Family Medicine

## 2021-07-25 VITALS — Temp 100.2°F

## 2021-07-25 DIAGNOSIS — R509 Fever, unspecified: Secondary | ICD-10-CM | POA: Insufficient documentation

## 2021-07-25 DIAGNOSIS — R051 Acute cough: Secondary | ICD-10-CM

## 2021-07-25 DIAGNOSIS — R52 Pain, unspecified: Secondary | ICD-10-CM | POA: Insufficient documentation

## 2021-07-25 LAB — POC COVID19 BINAXNOW: SARS Coronavirus 2 Ag: NEGATIVE

## 2021-07-25 LAB — POCT INFLUENZA A/B
Influenza A, POC: NEGATIVE
Influenza B, POC: NEGATIVE

## 2021-07-25 MED ORDER — GUAIFENESIN-CODEINE 100-10 MG/5ML PO SOLN: 5.0000 mL | Freq: Three times a day (TID) | ORAL | 0 refills | Status: DC | PRN

## 2021-07-25 NOTE — Assessment & Plan Note (Signed)
Continue over-the-counter medications as needed.  Pending COVID and flu testing.

## 2021-07-25 NOTE — Telephone Encounter (Signed)
error 

## 2021-07-25 NOTE — Progress Notes (Signed)
Patient ID: Kevin Charles, male    DOB: [birth year], 61 y.o.   MRN: [id number]  Virtual visit completed through [employer], a video enabled telemedicine application. Due to national recommendations of social distancing due to COVID-19, a virtual visit is felt to be most appropriate for this patient at this time. Reviewed limitations, risks, security and privacy concerns of performing a virtual visit and the availability of in person appointments. I also reviewed that there may be a patient responsible charge related to this service. The patient agreed to proceed.  Attempted to connect via video enabled device. Unsuccessful in connecting so reverted to telephone encounter.   Phone call lasted 13 mins and 02 seconds  Patient location: home Provider location: [place] at [facility], office Persons participating in this virtual visit: patient, provider   If any vitals were documented, they were collected by patient at home unless specified below.    Temp 100.2 F (37.9 C) Comment: per patient   CC: fever Subjective:   HPI: Kevin Charles is a 61 y.o. male presenting on 07/25/2021 for Fever (Sx started around 07/16/21 and went to urgent care and was tested for Covid and flu both were negative on 07/17/21. He was placed on antibiotic and finished last night but does not feel like it made much of a difference. As of last night started having with Chills, cough, runny nose, post nasal drip, body aches.-has used Tylenol, Robitussin, Mucinex.)   Symptoms started approx 07/16/2021. Was tested for flu and covid and both was negative They placed him on antibiotics for 7 days and finished them yesterday evening  Symptoms returned yesterday Has been using generic robitussin and mucinex, without relief.chloraseptic spray, netti pot, and warm salt water gargles  Vaccines x2 and booster   Relevant past medical, surgical, family and social history reviewed and updated as indicated. Interim  medical history since our last visit reviewed. Allergies and medications reviewed and updated. Outpatient Medications Prior to Visit  Medication Sig Dispense Refill   aspirin 81 MG tablet Take 81 mg by mouth daily.     atorvastatin (LIPITOR) 40 MG tablet TAKE 1 TABLET BY MOUTH ONCE DAILY 90 tablet 2   Benzoyl Peroxide 10 % CREA Apply to face at night.     chlorthalidone (HYGROTON) 25 MG tablet Take 1 tablet (25 mg total) by mouth every other day. 45 tablet 3   divalproex (DEPAKOTE ER) 500 MG 24 hr tablet TAKE 2 TABLETS BY MOUTH  TWICE DAILY, EXCEPT SUNDAY  AND WEDNESDAY ONLY TAKE 1  TABLET IN THE MORNING AND 2 TABLETS IN THE EVENING 360 tablet 3   levothyroxine (SYNTHROID) 150 MCG tablet TAKE 1 TABLET BY MOUTH ONCE DAILY 90 tablet 3   metoprolol tartrate (LOPRESSOR) 25 MG tablet TAKE 1 TABLET BY MOUTH  DAILY 90 tablet 3   pantoprazole (PROTONIX) 40 MG tablet Take 1 tablet (40 mg total) by mouth daily. 90 tablet 3   rizatriptan (MAXALT-MLT) 5 MG disintegrating tablet TAKE ONE (1) TABLET BY MOUTH AS NEEDED FOR MIGRAINE; MAY REPEAT IN 2 HOURS IF NEEDED 4 tablet 12   SAW PALMETTO, SERENOA REPENS, PO Take 1 or 2 tablets by mouth daily.     triamcinolone cream (KENALOG) 0.5 % Apply 1 application topically 2 (two) times daily as needed. 30 g 1   loratadine (CLARITIN) 10 MG tablet Take 10 mg by mouth daily as needed for allergies. (Patient not taking: Reported on 07/25/2021)     No facility-administered medications prior  to visit.     Per HPI unless specifically indicated in ROS section below Review of Systems  Constitutional:  Positive for chills, fatigue and fever.  HENT:  Positive for ear pain (fullness), postnasal drip, rhinorrhea, sinus pressure and sneezing.   Respiratory:  Positive for cough (yellow) and shortness of breath (with coughing).   Cardiovascular:  Positive for chest pain (with coughing).  Gastrointestinal:  Negative for abdominal pain, diarrhea, nausea and vomiting.   Musculoskeletal:  Positive for arthralgias and myalgias.  Neurological:  Negative for headaches.  Objective:  Temp 100.2 F (37.9 C) Comment: per patient  Wt Readings from Last 3 Encounters:  06/28/21 221 lb 6.4 oz (100.4 kg)  06/10/21 230 lb (104.3 kg)  06/03/21 225 lb (102.1 kg)       Physical exam: Gen: alert, NAD, not ill appearing Pulm: speaks in complete sentences without increased work of breathing Psych: normal mood, normal thought content      Results for orders placed or performed in visit on 06/10/21  Comprehensive metabolic panel  Result Value Ref Range   Sodium 142 135 - 145 mEq/L   Potassium 4.6 3.5 - 5.1 mEq/L   Chloride 102 96 - 112 mEq/L   CO2 33 (H) 19 - 32 mEq/L   Glucose, Bld 82 70 - 99 mg/dL   BUN 16 6 - 23 mg/dL   Creatinine, Ser 0.88 0.40 - 1.50 mg/dL   Total Bilirubin 1.4 (H) 0.2 - 1.2 mg/dL   Alkaline Phosphatase 73 39 - 117 U/L   AST 35 0 - 37 U/L   ALT 43 0 - 53 U/L   Total Protein 7.1 6.0 - 8.3 g/dL   Albumin 4.5 3.5 - 5.2 g/dL   GFR 93.15 >60.00 mL/min   Calcium 10.0 8.4 - 10.5 mg/dL  Lipid panel  Result Value Ref Range   Cholesterol 135 0 - 200 mg/dL   Triglycerides 139.0 0.0 - 149.0 mg/dL   HDL 33.90 (L) >39.00 mg/dL   VLDL 27.8 0.0 - 40.0 mg/dL   LDL Cholesterol 74 0 - 99 mg/dL   Total CHOL/HDL Ratio 4    NonHDL 101.40   CBC with Differential/Platelet  Result Value Ref Range   WBC 6.4 4.0 - 10.5 K/uL   RBC 5.29 4.22 - 5.81 Mil/uL   Hemoglobin 17.5 (H) 13.0 - 17.0 g/dL   HCT 49.9 39.0 - 52.0 %   MCV 94.4 78.0 - 100.0 fl   MCHC 35.1 30.0 - 36.0 g/dL   RDW 12.7 11.5 - 15.5 %   Platelets 152.0 150.0 - 400.0 K/uL   Neutrophils Relative % 52.9 43.0 - 77.0 %   Lymphocytes Relative 32.6 12.0 - 46.0 %   Monocytes Relative 11.6 3.0 - 12.0 %   Eosinophils Relative 2.1 0.0 - 5.0 %   Basophils Relative 0.8 0.0 - 3.0 %   Neutro Abs 3.4 1.4 - 7.7 K/uL   Lymphs Abs 2.1 0.7 - 4.0 K/uL   Monocytes Absolute 0.7 0.1 - 1.0 K/uL   Eosinophils  Absolute 0.1 0.0 - 0.7 K/uL   Basophils Absolute 0.1 0.0 - 0.1 K/uL  TSH  Result Value Ref Range   TSH 0.84 0.35 - 5.50 uIU/mL  PSA, Medicare  Result Value Ref Range   PSA 3.01 0.10 - 4.00 ng/ml  Valproic acid level  Result Value Ref Range   Valproic Acid Lvl 114.6 (H) 50.0 - 100.0 mg/L   Assessment & Plan:   Problem List Items Addressed This Visit  Other   Cough    For outpatient patient course of codeine guaifenesin cough syrup.  Pending COVID and flu testing      Relevant Medications   guaiFENesin-codeine 100-10 MG/5ML syrup   Other Relevant Orders   Influenza A/B   POC COVID-19   Body aches    Continue over-the-counter medications as needed.  Pending COVID and flu testing.      Relevant Orders   Influenza A/B   POC COVID-19   Fever and chills - Primary    Continue over-the-counter medications as needed.  Pending COVID and flu testing.      Relevant Orders   Influenza A/B   POC COVID-19     No orders of the defined types were placed in this encounter.  No orders of the defined types were placed in this encounter.   I discussed the assessment and treatment plan with the patient. The patient was provided an opportunity to ask questions and all were answered. The patient agreed with the plan and demonstrated an understanding of the instructions. The patient was advised to call back or seek an in-person evaluation if the symptoms worsen or if the condition fails to improve as anticipated.  Follow up plan: No follow-ups on file.  Romilda Garret, NP

## 2021-07-25 NOTE — Assessment & Plan Note (Signed)
For outpatient patient course of codeine guaifenesin cough syrup.  Pending COVID and flu testing

## 2021-07-26 ENCOUNTER — Telehealth: Payer: Self-pay

## 2021-07-26 NOTE — Telephone Encounter (Signed)
Noted thank you

## 2021-07-26 NOTE — Telephone Encounter (Signed)
[place] Night - Client TELEPHONE ADVICE RECORD [facility] Patient Name: Kevin Charles Gender: Male DOB: 05/10/1961 Age: 61 Y 1 M 35 D Return Phone Number: [phone] (Primary) Address: City/ State/ Zip: [place]  [address] Client [place] Night - Client Client Site [place] Provider Romilda Garret- NP Contact Type Call Who Is Calling Patient / Member / Family / Caregiver Call Type Triage / Clinical Relationship To Patient Self Return Phone Number [phone] (Primary) Chief Complaint Fever (non-urgent symptom) (greater than THREE MONTHS old) Reason for Call Symptomatic / Request for [place] states he had a covid and flu test done today and was told to take Tylenol with codeine but the pharmacy is out and wants to know if something else can be sent in. Caller states he does need it today because he has a fever and is not feeling well. CBWN Caller states if the rx can be sent to [facility] [address]. Translation No Nurse Assessment Nurse: Terence Lux, RN, Christine Date/Time (Eastern Time): 07/25/2021 6:11:19 PM Confirm and document reason for call. If symptomatic, describe symptoms. ---Caller states he had a covid and flu test done today. Given RX for cough medicine with codeine but the pharmacy is out. Wants to know if something else can be sent in. Caller states he does need it today because he has a fever and is not feeling well. CBWN Caller states if the rx can be sent to [facility] [address]. Does the patient have any new or worsening symptoms? ---No Nurse: Terence Lux, RN, Christine Date/Time (Eastern Time): 07/25/2021 6:14:21 PM Please select the assessment type ---RX called in but not at pharm Additional Documentation ---Caller needs RX for Cough Med w/ Codeine sent to a different pharmacy Document the  name of the medication. ---Cough Med w/ Codeine Pharmacy name and phone number. ---[facility] ([name]) [phone] Has the office closed within the last 30 minutes? ---No Does the client directives allow for assistance with medications after hours? ---Yes Is there an on-call physician for the client? ---Yes PLEASE NOTE: All timestamps contained within this report are represented as [place] Standard Time. CONFIDENTIALTY NOTICE: This fax transmission is intended only for the addressee. It contains information that is legally privileged, confidential or otherwise protected from use or disclosure. If you are not the intended recipient, you are strictly prohibited from reviewing, disclosing, copying using or disseminating any of this information or taking any action in reliance on or regarding this information. If you have received this fax in error, please notify us immediately by telephone so that we can arrange for its return to [place]. Phone: [phone], Toll-Free: [phone], Fax: [phone] Page: 2 of 2 Call Id: [id number] [place]. Time Eilene Ghazi Time) Disposition Final User 07/25/2021 5:57:25 PM Send To Call Back Waiting For Nurse Samule Ohm 07/25/2021 6:18:39 PM Paged On Call back to [facility], RN, Altha Harm 07/25/2021 6:23:01 PM Paged On Call back to [facility], RN, Altha Harm 07/25/2021 7:04:14 PM Called On-Call Provider Terence Lux, RN, Christine 07/25/2021 6:45:33 PM Clinical Call Yes Terence Lux, RN, [place] Phone DateTime Result/ Outcome Message Type Notes Deborra Medina - MD [phone] 07/25/2021 6:23:01 PM Called On Call Provider - Left Message Doctor Paged Deborra Medina - MD 07/25/2021 6:45:14 PM Spoke with On Call - [place] Deborra Medina - MD [phone] 07/25/2021 7:04:13 PM Called On Call Provider - Reached  Doctor Paged Deborra Medina - MD 07/25/2021 7:04:33 PM Spoke with On Call - General Message Result MD cld  in [place]

## 2021-07-26 NOTE — Telephone Encounter (Signed)
Per access nurse note Dr Derrel Nip called Guaifenesin codeine 100-10mg /5 ml syrup in to [facility]. Sending FYI to Romilda Garret NP who did VV on 07/25/21.

## 2021-07-27 ENCOUNTER — Telehealth: Payer: Self-pay

## 2021-07-27 ENCOUNTER — Telehealth (INDEPENDENT_AMBULATORY_CARE_PROVIDER_SITE_OTHER): Payer: Medicare Other | Admitting: Family Medicine

## 2021-07-27 VITALS — Temp 99.4°F | Ht 71.0 in | Wt 220.0 lb

## 2021-07-27 DIAGNOSIS — R051 Acute cough: Secondary | ICD-10-CM

## 2021-07-27 NOTE — Telephone Encounter (Signed)
Noted. Thanks.  Appreciate input from Dr. Gena Fray.

## 2021-07-27 NOTE — Telephone Encounter (Signed)
I spoke with pt and he said since he left the access note pt has already scheduled a VV with Dr Corky Downs 07/27/21 at 2:30. Pt said he cannot stop coughing; pt has dry cough and prod cough with yellow green phlegm. Pt has not taken temp but pt feels hot. No SOB but has wheezing on and off. Pt said he is sore all over. Pt said he does not feel like going anywhere. UC & ED precautions given and pt voiced understanding. Sending note to Romilda Garret NP and FYI to Dr Damita Dunnings as PCP.

## 2021-07-27 NOTE — Telephone Encounter (Signed)
Agree that patient needs to be re-evaluated since he was seen in urgent care prior to having a telephone visit with me
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
## 2021-07-27 NOTE — Telephone Encounter (Signed)
[place] Night - Client Nonclinical Telephone Record  [facility] Client [facility] Night - Client Client Site [facility] - Night Provider Renford Dills - MD Contact Type Call Who Is Calling Patient / Member / Family / Caregiver Caller Name Marley Charlot Caller Phone Number [phone] Patient Name Kevin Charles Patient DOB 11/08/1960 Call Type Message Only Information Provided Reason for Call Request to Schedule Office Appointment Initial Comment Caller states had a virtual visit, prescribed Cough syrup, has fevers, and is following up letting them know that he is worse. Patient request to speak to RN No Disp. Time Disposition Final User 07/27/2021 8:27:50 AM General Information Provided Yes Ky Barban Call Closed By: Ky Barban Transaction Date/Time: 07/27/2021 8:16:05 AM (ET

## 2021-07-28 ENCOUNTER — Other Ambulatory Visit: Payer: Self-pay

## 2021-07-28 ENCOUNTER — Ambulatory Visit (INDEPENDENT_AMBULATORY_CARE_PROVIDER_SITE_OTHER): Payer: Medicare Other | Admitting: Family Medicine

## 2021-07-28 ENCOUNTER — Ambulatory Visit (INDEPENDENT_AMBULATORY_CARE_PROVIDER_SITE_OTHER): Payer: Medicare Other

## 2021-07-28 ENCOUNTER — Encounter: Payer: Self-pay | Admitting: Family Medicine

## 2021-07-28 VITALS — BP 138/82 | HR 70 | Temp 97.4°F | Ht 71.0 in | Wt 225.6 lb

## 2021-07-28 DIAGNOSIS — J0101 Acute recurrent maxillary sinusitis: Secondary | ICD-10-CM | POA: Diagnosis not present

## 2021-07-28 DIAGNOSIS — J40 Bronchitis, not specified as acute or chronic: Secondary | ICD-10-CM | POA: Diagnosis not present

## 2021-07-28 DIAGNOSIS — R059 Cough, unspecified: Secondary | ICD-10-CM | POA: Diagnosis not present

## 2021-07-28 MED ORDER — DOXYCYCLINE HYCLATE 100 MG PO TABS: 100.0000 mg | ORAL_TABLET | Freq: Two times a day (BID) | ORAL | 0 refills | Status: DC

## 2021-07-28 MED ORDER — GUAIFENESIN-CODEINE 100-10 MG/5ML PO SOLN: 5.0000 mL | Freq: Three times a day (TID) | ORAL | 0 refills | Status: AC | PRN
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
## 2021-08-19 LAB — HM HEPATITIS C SCREENING LAB: HM Hepatitis Screen: NEGATIVE

## 2021-08-31 DIAGNOSIS — H905 Unspecified sensorineural hearing loss: Secondary | ICD-10-CM | POA: Diagnosis not present

## 2021-09-16 DIAGNOSIS — H903 Sensorineural hearing loss, bilateral: Secondary | ICD-10-CM | POA: Diagnosis not present

## 2021-11-08 ENCOUNTER — Ambulatory Visit (INDEPENDENT_AMBULATORY_CARE_PROVIDER_SITE_OTHER): Payer: Medicare Other | Admitting: Family

## 2021-11-08 ENCOUNTER — Encounter: Payer: Self-pay | Admitting: Family

## 2021-11-08 VITALS — BP 122/78 | HR 62 | Temp 98.5°F | Resp 16 | Ht 71.0 in | Wt 224.6 lb

## 2021-11-08 DIAGNOSIS — L237 Allergic contact dermatitis due to plants, except food: Secondary | ICD-10-CM | POA: Diagnosis not present

## 2021-11-08 MED ORDER — PREDNISONE 20 MG PO TABS: ORAL_TABLET | ORAL | 0 refills | Status: DC

## 2021-11-08 NOTE — Patient Instructions (Signed)
Due to recent changes in healthcare laws, you may see results of your imaging and/or laboratory studies on MyChart before I have had a chance to review them.  I understand that in some cases there may be results that are confusing or concerning to you. Please understand that not all results are received at the same time and often I may need to interpret multiple results in order to provide you with the best plan of care or course of treatment. Therefore, I ask that you please give me 2 business days to thoroughly review all your results before contacting my office for clarification. Should we see a critical lab result, you will be contacted sooner.   It was a pleasure seeing you today! Please do not hesitate to reach out with any questions and or concerns.  Regards,   Nole Robey FNP-C  
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
## 2021-11-09 DIAGNOSIS — L237 Allergic contact dermatitis due to plants, except food: Secondary | ICD-10-CM | POA: Insufficient documentation

## 2021-11-09 NOTE — Assessment & Plan Note (Signed)
Triamcinolone cream prn rx prednisone 20 mg taper Handout given for poison ivy

## 2021-11-15 ENCOUNTER — Ambulatory Visit (INDEPENDENT_AMBULATORY_CARE_PROVIDER_SITE_OTHER): Payer: Medicare Other | Admitting: Family

## 2021-11-15 ENCOUNTER — Encounter: Payer: Self-pay | Admitting: Family

## 2021-11-15 VITALS — BP 122/72 | HR 60 | Temp 98.4°F | Resp 16 | Ht 71.0 in | Wt 223.2 lb

## 2021-11-15 DIAGNOSIS — L03119 Cellulitis of unspecified part of limb: Secondary | ICD-10-CM | POA: Insufficient documentation

## 2021-11-15 DIAGNOSIS — L237 Allergic contact dermatitis due to plants, except food: Secondary | ICD-10-CM

## 2021-11-15 MED ORDER — BETAMETHASONE DIPROPIONATE AUG 0.05 % EX CREA: TOPICAL_CREAM | Freq: Two times a day (BID) | CUTANEOUS | 0 refills | Status: DC

## 2021-11-15 MED ORDER — CLOBETASOL PROPIONATE 0.05 % EX CREA: 1.0000 "application " | TOPICAL_CREAM | Freq: Two times a day (BID) | CUTANEOUS | 0 refills | Status: DC

## 2021-11-15 MED ORDER — CEPHALEXIN 500 MG PO CAPS: 500.0000 mg | ORAL_CAPSULE | Freq: Two times a day (BID) | ORAL | 0 refills | Status: AC

## 2021-11-15 MED ORDER — PREDNISONE 20 MG PO TABS: ORAL_TABLET | ORAL | 0 refills | Status: DC

## 2021-11-15 NOTE — Assessment & Plan Note (Signed)
rx cephalexin 500 mg po bid x 10 days Monitor for worsening s/s infection

## 2021-11-15 NOTE — Assessment & Plan Note (Signed)
rx diprolene cream  Stop triamincinolone New prednisone taper 20 mg
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
## 2021-11-17 ENCOUNTER — Telehealth: Payer: Self-pay

## 2021-11-17 NOTE — Telephone Encounter (Signed)
Patient called in stating that he was seen earlier this week for poison ivy and was prescribed medication. Rigdon states that he doesn't believe it is getting better, its lighter color but still very itchy. Asking what else we can do for him.

## 2021-11-18 ENCOUNTER — Encounter: Payer: Self-pay | Admitting: Family

## 2021-11-18 ENCOUNTER — Ambulatory Visit (INDEPENDENT_AMBULATORY_CARE_PROVIDER_SITE_OTHER): Payer: Medicare Other | Admitting: Family

## 2021-11-18 DIAGNOSIS — L237 Allergic contact dermatitis due to plants, except food: Secondary | ICD-10-CM | POA: Diagnosis not present

## 2021-11-18 DIAGNOSIS — L03119 Cellulitis of unspecified part of limb: Secondary | ICD-10-CM | POA: Diagnosis not present

## 2021-11-18 NOTE — Assessment & Plan Note (Signed)
Finish duration of prednisone therapy  Improvement in skin lesion slightly from last visit

## 2021-11-18 NOTE — Assessment & Plan Note (Signed)
Advised pt from last visit looks like some improvement with cephalexin  Pt to continue course  If no improvement see pcp and or dermatology for possible punch biopsy  For itching use triamcinolone cream at home
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
## 2021-11-18 NOTE — Telephone Encounter (Signed)
Pt seen in office today 11/18/2021

## 2021-11-22 ENCOUNTER — Telehealth: Payer: Self-pay

## 2021-11-22 NOTE — Telephone Encounter (Signed)
Patient is calling in stating that he is doing so much better, spots are still there but smaller and looking so much better.

## 2021-11-23 NOTE — Telephone Encounter (Signed)
Noted  

## 2021-11-24 ENCOUNTER — Ambulatory Visit: Payer: Medicare Other | Admitting: Family Medicine

## 2021-11-24 ENCOUNTER — Other Ambulatory Visit: Payer: Self-pay | Admitting: Family Medicine

## 2021-11-24 DIAGNOSIS — H25813 Combined forms of age-related cataract, bilateral: Secondary | ICD-10-CM | POA: Diagnosis not present

## 2021-11-27 ENCOUNTER — Other Ambulatory Visit: Payer: Self-pay | Admitting: Family Medicine

## 2021-12-01 ENCOUNTER — Encounter: Payer: Self-pay | Admitting: Internal Medicine

## 2021-12-01 ENCOUNTER — Ambulatory Visit (INDEPENDENT_AMBULATORY_CARE_PROVIDER_SITE_OTHER): Payer: Medicare Other | Admitting: Internal Medicine

## 2021-12-01 DIAGNOSIS — L989 Disorder of the skin and subcutaneous tissue, unspecified: Secondary | ICD-10-CM | POA: Insufficient documentation

## 2021-12-01 DIAGNOSIS — M79671 Pain in right foot: Secondary | ICD-10-CM | POA: Diagnosis not present

## 2021-12-01 MED ORDER — HYDROCORTISONE 2.5 % EX CREA: TOPICAL_CREAM | Freq: Three times a day (TID) | CUTANEOUS | 3 refills | Status: DC | PRN

## 2021-12-01 NOTE — Assessment & Plan Note (Signed)
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

## 2021-12-01 NOTE — Assessment & Plan Note (Signed)
Just a few days Probably mechanical If this persists, will set up with Dr Lorelei Pont
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
## 2021-12-06 ENCOUNTER — Encounter: Payer: Self-pay | Admitting: Dermatology

## 2021-12-06 ENCOUNTER — Ambulatory Visit: Payer: Medicare Other | Admitting: Dermatology

## 2021-12-06 DIAGNOSIS — S90861A Insect bite (nonvenomous), right foot, initial encounter: Secondary | ICD-10-CM

## 2021-12-06 DIAGNOSIS — S80862A Insect bite (nonvenomous), left lower leg, initial encounter: Secondary | ICD-10-CM | POA: Diagnosis not present

## 2021-12-06 DIAGNOSIS — W57XXXA Bitten or stung by nonvenomous insect and other nonvenomous arthropods, initial encounter: Secondary | ICD-10-CM | POA: Diagnosis not present

## 2021-12-06 DIAGNOSIS — S80861A Insect bite (nonvenomous), right lower leg, initial encounter: Secondary | ICD-10-CM

## 2021-12-06 DIAGNOSIS — L299 Pruritus, unspecified: Secondary | ICD-10-CM

## 2021-12-06 DIAGNOSIS — S90862A Insect bite (nonvenomous), left foot, initial encounter: Secondary | ICD-10-CM | POA: Diagnosis not present

## 2021-12-06 DIAGNOSIS — D2372 Other benign neoplasm of skin of left lower limb, including hip: Secondary | ICD-10-CM | POA: Diagnosis not present

## 2021-12-06 MED ORDER — CLOBETASOL PROPIONATE 0.05 % EX CREA: TOPICAL_CREAM | CUTANEOUS | 2 refills | Status: DC

## 2021-12-06 NOTE — Patient Instructions (Addendum)
Start Clobetasol cream twice daily up to 3 weeks to affected areas only as needed for itching.  Avoid applying to face, groin, and axilla. Use as directed. Long-term use can cause thinning of the skin.   Recommend taking Allegra (Fexofenadine) 180 mg or Claritin 10 mg daily, if still itching in a few days increase to 2 daily, can increase up to 4 daily if needed.   Topical steroids (such as triamcinolone, fluocinolone, fluocinonide, mometasone, clobetasol, halobetasol, betamethasone, hydrocortisone) can cause thinning and lightening of the skin if they are used for too long in the same area. Your physician has selected the right strength medicine for your problem and area affected on the body. Please use your medication only as directed by your physician to prevent side effects.    Due to recent changes in healthcare laws, you may see results of your pathology and/or laboratory studies on MyChart before the doctors have had a chance to review them. We understand that in some cases there may be results that are confusing or concerning to you. Please understand that not all results are received at the same time and often the doctors may need to interpret multiple results in order to provide you with the best plan of care or course of treatment. Therefore, we ask that you please give [place] 2 business days to thoroughly review all your results before contacting the office for clarification. Should we see a critical lab result, you will be contacted sooner.   If You Need Anything After Your Visit  If you have any questions or concerns for your doctor, please call our main line at [phone] and press option 4 to reach your doctor's medical assistant. If no one answers, please leave a voicemail as directed and we will return your call as soon as possible. Messages left after 4 pm will be answered the following business day.   You may also send [place] a message via [place]. We typically respond to MyChart  messages within 1-2 business days.  For prescription refills, please ask your pharmacy to contact our office. Our fax number is [phone].  If you have an urgent issue when the clinic is closed that cannot wait until the next business day, you can page your doctor at the number below.    Please note that while we do our best to be available for urgent issues outside of office hours, we are not available 24/7.   If you have an urgent issue and are unable to reach [place], you may choose to seek medical care at your doctor's office, retail clinic, urgent care center, or emergency room.  If you have a medical emergency, please immediately call 911 or go to the emergency department.  Pager Numbers  - Dr. Nehemiah Massed: [phone]  - Dr. Laurence Ferrari: [phone]  - Dr. Nicole Kindred: [phone]  In the event of inclement weather, please call our main line at [phone] for an update on the status of any delays or closures.  Dermatology Medication Tips: Please keep the boxes that topical medications come in in order to help keep track of the instructions about where and how to use these. Pharmacies typically print the medication instructions only on the boxes and not directly on the medication tubes.   If your medication is too expensive, please contact our office at [phone] option 4 or send [place] a message through [place].   We are unable to tell what your co-pay for medications will be in advance as this is different depending on  your insurance coverage. However, we may be able to find a substitute medication at lower cost or fill out paperwork to get insurance to cover a needed medication.   If a prior authorization is required to get your medication covered by your insurance company, please allow [place] 1-2 business days to complete this process.  Drug prices often vary depending on where the prescription is filled and some pharmacies may offer cheaper prices.  The website www.goodrx.com contains  coupons for medications through different pharmacies. The prices here do not account for what the cost may be with help from insurance (it may be cheaper with your insurance), but the website can give you the price if you did not use any insurance.  - You can print the associated coupon and take it with your prescription to the pharmacy.  - You may also stop by our office during regular business hours and pick up a GoodRx coupon card.  - If you need your prescription sent electronically to a different pharmacy, notify our office through [facility] or by phone at [phone] option 4.     Si Usted Necesita Algo Despus de Su Visita  Tambin puede enviarnos un mensaje a travs de Pharmacist, community. Por lo general respondemos a los mensajes de MyChart en el transcurso de 1 a 2 das hbiles.  Para renovar recetas, por favor pida a su farmacia que se ponga en contacto con nuestra oficina. Harland Dingwall de fax es [place] [phone].  Si tiene un asunto urgente cuando la clnica est cerrada y que no puede esperar hasta el siguiente da hbil, puede llamar/localizar a su doctor(a) al nmero que aparece a continuacin.   Por favor, tenga en cuenta que aunque hacemos todo lo posible para estar disponibles para asuntos urgentes fuera del horario de [place], no estamos disponibles las 24 horas del da, los 7 das de la [place].   Si tiene un problema urgente y no puede comunicarse con nosotros, puede optar por buscar atencin mdica  en el consultorio de su doctor(a), en una clnica privada, en un centro de atencin urgente o en una sala de emergencias.  Si tiene Engineering geologist, por favor llame inmediatamente al 911 o vaya a la sala de emergencias.  Nmeros de bper  - Dr. Nehemiah Massed: [phone]  - Dra. Moye: [phone]  - Dra. Nicole Kindred: [phone]  En caso de inclemencias del [place], por favor llame a Johnsie Kindred principal al [phone] para una actualizacin sobre el [place] de  cualquier retraso o cierre.  Consejos para la medicacin en dermatologa: Por favor, guarde las cajas en las que vienen los medicamentos de uso tpico para ayudarle a seguir las instrucciones sobre dnde y cmo usarlos. Las farmacias generalmente imprimen las instrucciones del medicamento slo en las cajas y no directamente en los tubos del [place].   Si su medicamento es muy caro, por favor, pngase en contacto con Zigmund Daniel llamando al [phone] y presione la opcin 4 o envenos un mensaje a travs de Pharmacist, community.   No podemos decirle cul ser su copago por los medicamentos por adelantado ya que esto es diferente dependiendo de la cobertura de su seguro. Sin embargo, es posible que podamos encontrar un medicamento sustituto a Electrical engineer un formulario para que el seguro cubra el medicamento que se considera necesario.   Si se requiere una autorizacin previa para que su compaa de seguros [place] su medicamento, por favor permtanos de 1 a 2 das hbiles para completar este proceso.  Los precios de los medicamentos varan con frecuencia dependiendo del Environmental consultant de dnde se surte la receta y alguna farmacias pueden ofrecer precios ms baratos.  El sitio web www.goodrx.com tiene cupones para medicamentos de Airline pilot. Los precios aqu no tienen en cuenta lo que podra costar con la ayuda del seguro (puede ser ms barato con su seguro), pero el sitio web puede darle el precio si no utiliz Research scientist (physical sciences).  - Puede imprimir el cupn correspondiente y llevarlo con su receta a la farmacia.  - Tambin puede pasar por nuestra oficina durante el horario de atencin regular y Charity fundraiser una tarjeta de cupones de GoodRx.  - Si necesita que su receta se enve electrnicamente a una farmacia diferente, informe a nuestra oficina a travs de MyChart de [place] o por telfono llamando al [phone] y presione la opcin 4.

## 2021-12-06 NOTE — Progress Notes (Signed)
   Follow-Up Visit   Subjective  Kevin Charles is a 61 y.o. male who presents for the following: Rash (Legs, face. Spreading. Dur: >1 month. Has seen PA 3 times. Dx with poison ivy vs insect bite.  Has taken 2 rounds of prednisone and one round of antibiotics (cephalexin). Has been using Triamcinolone and Betamethasone dip cream. Saw Dr. Silvio Pate last week, was told not poison ivy because it had lasted so long. Has improved but not completely resolved, will still itch at times. Was out in yard working and has noticed new spots since this past weekend). The patient has spots, moles and lesions to be evaluated, some may be new or changing and the patient has concerns that these could be cancer.  The following portions of the chart were reviewed this encounter and updated as appropriate:  Tobacco  Allergies  Meds  Problems  Med Hx  Surg Hx  Fam Hx     Review of Systems: No other skin or systemic complaints except as noted in HPI or Assessment and Plan.  Objective  Well appearing patient in no apparent distress; mood and affect are within normal limits.  A focused examination was performed including face, legs. Relevant physical exam findings are noted in the Assessment and Plan.  legs, feet Scattered erythematous papules            Assessment & Plan   Dermatofibroma. Left anterior leg. - Firm pink/brown papulenodule with dimple sign - Benign appearing - Call for any changes  Bug bite without infection, initial encounter legs, feet No signs/symptoms of infection, cellulitis, or vasculitis.   Start Clobetasol cream twice daily up to 3 weeks to affected areas only as needed for itching.  Avoid applying to face, groin, and axilla. Use as directed. Long-term use can cause thinning of the skin.   Recommend taking Allegra (Fexofenadine) 180 mg or Claritin 10 mg daily, if still itching in a few days increase to 2 daily, can increase up to 4 daily if needed.   Topical  steroids (such as triamcinolone, fluocinolone, fluocinonide, mometasone, clobetasol, halobetasol, betamethasone, hydrocortisone) can cause thinning and lightening of the skin if they are used for too long in the same area. Your physician has selected the right strength medicine for your problem and area affected on the body. Please use your medication only as directed by your physician to prevent side effects.   **Patient called after going home and left message stating that he has already been using the Clobetasol cream. He also has Triamcinolone cream and Betamethasone cream. He states he will add Claritin as directed**  clobetasol cream (TEMOVATE) 0.05 % - legs, feet Apply twice daily to affected areas for itching as needed only. Avoid applying to face, groin, and axilla.  Return for TBSE As Scheduled, Rash Follow Up.  I, Emelia Salisbury, CMA, am acting as scribe for Sarina Ser, MD. Documentation: I have reviewed the above documentation for accuracy and completeness, and I agree with the above.  Sarina Ser, MD

## 2021-12-08 ENCOUNTER — Encounter: Payer: Self-pay | Admitting: Dermatology

## 2021-12-14 ENCOUNTER — Encounter: Payer: Medicare Other | Admitting: Dermatology

## 2021-12-24 ENCOUNTER — Other Ambulatory Visit: Payer: Self-pay | Admitting: Cardiovascular Disease

## 2021-12-24 DIAGNOSIS — I10 Essential (primary) hypertension: Secondary | ICD-10-CM

## 2021-12-28 ENCOUNTER — Ambulatory Visit: Payer: Medicare Other | Admitting: Dermatology

## 2021-12-28 DIAGNOSIS — L578 Other skin changes due to chronic exposure to nonionizing radiation: Secondary | ICD-10-CM | POA: Diagnosis not present

## 2021-12-28 DIAGNOSIS — L738 Other specified follicular disorders: Secondary | ICD-10-CM

## 2021-12-28 DIAGNOSIS — D229 Melanocytic nevi, unspecified: Secondary | ICD-10-CM

## 2021-12-28 DIAGNOSIS — L570 Actinic keratosis: Secondary | ICD-10-CM | POA: Diagnosis not present

## 2021-12-28 DIAGNOSIS — Z85828 Personal history of other malignant neoplasm of skin: Secondary | ICD-10-CM

## 2021-12-28 DIAGNOSIS — L813 Cafe au lait spots: Secondary | ICD-10-CM

## 2021-12-28 DIAGNOSIS — Z1283 Encounter for screening for malignant neoplasm of skin: Secondary | ICD-10-CM

## 2021-12-28 DIAGNOSIS — C4491 Basal cell carcinoma of skin, unspecified: Secondary | ICD-10-CM

## 2021-12-28 DIAGNOSIS — L814 Other melanin hyperpigmentation: Secondary | ICD-10-CM | POA: Diagnosis not present

## 2021-12-28 DIAGNOSIS — C4441 Basal cell carcinoma of skin of scalp and neck: Secondary | ICD-10-CM | POA: Diagnosis not present

## 2021-12-28 DIAGNOSIS — D1800 Hemangioma unspecified site: Secondary | ICD-10-CM

## 2021-12-28 DIAGNOSIS — L821 Other seborrheic keratosis: Secondary | ICD-10-CM | POA: Diagnosis not present

## 2021-12-28 DIAGNOSIS — D492 Neoplasm of unspecified behavior of bone, soft tissue, and skin: Secondary | ICD-10-CM

## 2021-12-28 DIAGNOSIS — D2372 Other benign neoplasm of skin of left lower limb, including hip: Secondary | ICD-10-CM

## 2021-12-28 DIAGNOSIS — D2371 Other benign neoplasm of skin of right lower limb, including hip: Secondary | ICD-10-CM

## 2021-12-28 HISTORY — DX: Basal cell carcinoma of skin, unspecified: C44.91

## 2021-12-28 NOTE — Patient Instructions (Addendum)
Recommend OTC Gold Bond Rapid Relief Anti-Itch cream (pramoxine + menthol), CeraVe Anti-itch cream or lotion (pramoxine), Sarna lotion (Original- menthol + camphor or Sensitive- pramoxine) or Eucerin 12 hour Itch Relief lotion (menthol) up to 3 times per day to areas on body that are itchy.  Cryotherapy Aftercare  Wash gently with soap and water everyday.   Apply Vaseline and Band-Aid daily until healed.    Wound Care Instructions  Cleanse wound gently with soap and water once a day then pat dry with clean gauze. Apply a thing coat of Petrolatum (petroleum jelly, "Vaseline") over the wound (unless you have an allergy to this). We recommend that you use a new, sterile tube of Vaseline. Do not pick or remove scabs. Do not remove the yellow or white "healing tissue" from the base of the wound.  Cover the wound with fresh, clean, nonstick gauze and secure with paper tape. You may use Band-Aids in place of gauze and tape if the would is small enough, but would recommend trimming much of the tape off as there is often too much. Sometimes Band-Aids can irritate the skin.  You should call the office for your biopsy report after 1 week if you have not already been contacted.  If you experience any problems, such as abnormal amounts of bleeding, swelling, significant bruising, significant pain, or evidence of infection, please call the office immediately.  FOR ADULT SURGERY PATIENTS: If you need something for pain relief you may take 1 extra strength Tylenol (acetaminophen) AND 2 Ibuprofen ('200mg'$  each) together every 4 hours as needed for pain. (do not take these if you are allergic to them or if you have a reason you should not take them.) Typically, you may only need pain medication for 1 to 3 days.    Recommend taking Heliocare sun protection supplement daily in sunny weather for additional sun protection. For maximum protection on the sunniest days, you can take up to 2 capsules of regular Heliocare  OR take 1 capsule of Heliocare Ultra. For prolonged exposure (such as a full day in the sun), you can repeat your dose of the supplement 4 hours after your first dose. Heliocare can be purchased at [employer], at some [facility] or at [email or web address].    Melanoma ABCDEs  Melanoma is the most dangerous type of skin cancer, and is the leading cause of death from skin disease.  You are more likely to develop melanoma if you: Have light-colored skin, light-colored eyes, or red or blond hair Spend a lot of time in the sun Tan regularly, either outdoors or in a tanning bed Have had blistering sunburns, especially during childhood Have a close family member who has had a melanoma Have atypical moles or large birthmarks  Early detection of melanoma is key since treatment is typically straightforward and cure rates are extremely high if we catch it early.   The first sign of melanoma is often a change in a mole or a new dark spot.  The ABCDE system is a way of remembering the signs of melanoma.  A for asymmetry:  The two halves do not match. B for border:  The edges of the growth are irregular. C for color:  A mixture of colors are present instead of an even brown color. D for diameter:  Melanomas are usually (but not always) greater than 70m - the size of a pencil eraser. E for evolution:  The spot keeps changing in size, shape, and color.  Please check your  skin once per month between visits. You can use a small mirror in front and a large mirror behind you to keep an eye on the back side or your body.   If you see any new or changing lesions before your next follow-up, please call to schedule a visit.  Please continue daily skin protection including broad spectrum sunscreen SPF 30+ to sun-exposed areas, reapplying every 2 hours as needed when you're outdoors.    Due to recent changes in healthcare laws, you may see results of your pathology and/or laboratory studies on MyChart before  the doctors have had a chance to review them. We understand that in some cases there may be results that are confusing or concerning to you. Please understand that not all results are received at the same time and often the doctors may need to interpret multiple results in order to provide you with the best plan of care or course of treatment. Therefore, we ask that you please give [place] 2 business days to thoroughly review all your results before contacting the office for clarification. Should we see a critical lab result, you will be contacted sooner.   If You Need Anything After Your Visit  If you have any questions or concerns for your doctor, please call our main line at [phone] and press option 4 to reach your doctor's medical assistant. If no one answers, please leave a voicemail as directed and we will return your call as soon as possible. Messages left after 4 pm will be answered the following business day.   You may also send [place] a message via [place]. We typically respond to MyChart messages within 1-2 business days.  For prescription refills, please ask your pharmacy to contact our office. Our fax number is [phone].  If you have an urgent issue when the clinic is closed that cannot wait until the next business day, you can page your doctor at the number below.    Please note that while we do our best to be available for urgent issues outside of office hours, we are not available 24/7.   If you have an urgent issue and are unable to reach [place], you may choose to seek medical care at your doctor's office, retail clinic, urgent care center, or emergency room.  If you have a medical emergency, please immediately call 911 or go to the emergency department.  Pager Numbers  - Dr. Nehemiah Massed: [phone]  - Dr. Laurence Ferrari: [phone]  - Dr. Nicole Kindred: [phone]  In the event of inclement weather, please call our main line at [phone] for an update on the status of any delays or  closures.  Dermatology Medication Tips: Please keep the boxes that topical medications come in in order to help keep track of the instructions about where and how to use these. Pharmacies typically print the medication instructions only on the boxes and not directly on the medication tubes.   If your medication is too expensive, please contact our office at [phone] option 4 or send [place] a message through [place].   We are unable to tell what your co-pay for medications will be in advance as this is different depending on your insurance coverage. However, we may be able to find a substitute medication at lower cost or fill out paperwork to get insurance to cover a needed medication.   If a prior authorization is required to get your medication covered by your insurance company, please allow [place] 1-2 business days to complete this process.  Drug prices often vary depending on where the prescription is filled and some pharmacies may offer cheaper prices.  The website www.goodrx.com contains coupons for medications through different pharmacies. The prices here do not account for what the cost may be with help from insurance (it may be cheaper with your insurance), but the website can give you the price if you did not use any insurance.  - You can print the associated coupon and take it with your prescription to the pharmacy.  - You may also stop by our office during regular business hours and pick up a GoodRx coupon card.  - If you need your prescription sent electronically to a different pharmacy, notify our office through [facility] or by phone at [phone] option 4.     Si Usted Necesita Algo Despus de Su Visita  Tambin puede enviarnos un mensaje a travs de Pharmacist, community. Por lo general respondemos a los mensajes de MyChart en el transcurso de 1 a 2 das hbiles.  Para renovar recetas, por favor pida a su farmacia que se ponga en contacto con nuestra oficina. Harland Dingwall de fax  es [place] [phone].  Si tiene un asunto urgente cuando la clnica est cerrada y que no puede esperar hasta el siguiente da hbil, puede llamar/localizar a su doctor(a) al nmero que aparece a continuacin.   Por favor, tenga en cuenta que aunque hacemos todo lo posible para estar disponibles para asuntos urgentes fuera del horario de [place], no estamos disponibles las 24 horas del da, los 7 das de la [place].   Si tiene un problema urgente y no puede comunicarse con nosotros, puede optar por buscar atencin mdica  en el consultorio de su doctor(a), en una clnica privada, en un centro de atencin urgente o en una sala de emergencias.  Si tiene Engineering geologist, por favor llame inmediatamente al 911 o vaya a la sala de emergencias.  Nmeros de bper  - Dr. Nehemiah Massed: [phone]  - Dra. Moye: [phone]  - Dra. Nicole Kindred: [phone]  En caso de inclemencias del [place], por favor llame a Johnsie Kindred principal al [phone] para una actualizacin sobre el [place] de cualquier retraso o cierre.  Consejos para la medicacin en dermatologa: Por favor, guarde las cajas en las que vienen los medicamentos de uso tpico para ayudarle a seguir las instrucciones sobre dnde y cmo usarlos. Las farmacias generalmente imprimen las instrucciones del medicamento slo en las cajas y no directamente en los tubos del [place].   Si su medicamento es muy caro, por favor, pngase en contacto con Zigmund Daniel llamando al [phone] y presione la opcin 4 o envenos un mensaje a travs de Pharmacist, community.   No podemos decirle cul ser su copago por los medicamentos por adelantado ya que esto es diferente dependiendo de la cobertura de su seguro. Sin embargo, es posible que podamos encontrar un medicamento sustituto a Electrical engineer un formulario para que el seguro cubra el medicamento que se considera necesario.   Si se requiere una autorizacin previa para que su compaa de seguros [place]  su medicamento, por favor permtanos de 1 a 2 das hbiles para completar este proceso.  Los precios de los medicamentos varan con frecuencia dependiendo del Environmental consultant de dnde se surte la receta y alguna farmacias pueden ofrecer precios ms baratos.  El sitio web www.goodrx.com tiene cupones para medicamentos de Airline pilot. Los precios aqu no tienen en cuenta lo que podra costar con la ayuda del seguro (puede ser ms  barato con su seguro), pero el sitio web puede darle el precio si no utiliz ningn seguro.  - Puede imprimir el cupn correspondiente y llevarlo con su receta a la farmacia.  - Tambin puede pasar por nuestra oficina durante el horario de atencin regular y recoger una tarjeta de cupones de GoodRx.  - Si necesita que su receta se enve electrnicamente a una farmacia diferente, informe a nuestra oficina a travs de MyChart de [place] o por telfono llamando al [phone] y presione la opcin 4.  

## 2021-12-28 NOTE — Progress Notes (Signed)
Follow-Up Visit   Subjective  Kevin Charles is a 61 y.o. male who presents for the following: Annual Exam (The patient presents for Total-Body Skin Exam (TBSE) for skin cancer screening and mole check.  The patient has spots, moles and lesions to be evaluated, some may be new or changing and the patient has concerns that these could be cancer. Patient with hx of BCC and AK's. ).  Patient seen recently by Dr. Nehemiah Massed for poison ivy/insect bites and was prescribed clobetasol cream, improved, patient has 2 refills.   The following portions of the chart were reviewed this encounter and updated as appropriate:   Tobacco  Allergies  Meds  Problems  Med Hx  Surg Hx  Fam Hx      Review of Systems:  No other skin or systemic complaints except as noted in HPI or Assessment and Plan.  Objective  Well appearing patient in no apparent distress; mood and affect are within normal limits.  A full examination was performed including scalp, head, eyes, ears, nose, lips, neck, chest, axillae, abdomen, back, buttocks, bilateral upper extremities, bilateral lower extremities, hands, feet, fingers, toes, fingernails, and toenails. All findings within normal limits unless otherwise noted below.  right helix x 1, left helix x 1, mid forehead x 1 (3) Erythematous thin papules/macules with gritty scale.   left neck postauricular 0.8 cm thin pink papule  face Small yellow papules with a central dell.      Assessment & Plan  AK (actinic keratosis) (3) right helix x 1, left helix x 1, mid forehead x 1  Actinic keratoses are precancerous spots that appear secondary to cumulative UV radiation exposure/sun exposure over time. They are chronic with expected duration over 1 year. A portion of actinic keratoses will progress to squamous cell carcinoma of the skin. It is not possible to reliably predict which spots will progress to skin cancer and so treatment is recommended to prevent development of  skin cancer.  Recommend daily broad spectrum sunscreen SPF 30+ to sun-exposed areas, reapply every 2 hours as needed.  Recommend staying in the shade or wearing long sleeves, sun glasses (UVA+UVB protection) and wide brim hats (4-inch brim around the entire circumference of the hat). Call for new or changing lesions.  Prior to procedure, discussed risks of blister formation, small wound, skin dyspigmentation, or rare scar following cryotherapy. Recommend Vaseline ointment to treated areas while healing.    Destruction of lesion - right helix x 1, left helix x 1, mid forehead x 1  Destruction method: cryotherapy   Informed consent: discussed and consent obtained   Lesion destroyed using liquid nitrogen: Yes   Cryotherapy cycles:  2 Outcome: patient tolerated procedure well with no complications   Post-procedure details: wound care instructions given   Additional details:  Prior to procedure, discussed risks of blister formation, small wound, skin dyspigmentation, or rare scar following cryotherapy. Recommend Vaseline ointment to treated areas while healing.   Neoplasm of skin left neck postauricular  Skin / nail biopsy Type of biopsy: tangential   Informed consent: discussed and consent obtained   Timeout: patient name, date of birth, surgical site, and procedure verified   Procedure prep:  Patient was prepped and draped in usual sterile fashion Prep type:  Isopropyl alcohol Anesthesia: the lesion was anesthetized in a standard fashion   Anesthetic:  1% lidocaine w/ epinephrine 1-100,000 buffered w/ 8.4% NaHCO3 Instrument used: flexible razor blade   Hemostasis achieved with: aluminum chloride   Outcome:  patient tolerated procedure well   Post-procedure details: wound care instructions given   Additional details:  Petrolatum and a pressure bandage applied  Specimen 1 - Surgical pathology Differential Diagnosis: r/o BCC vs SCCis vs AK  Check Margins: No 0.8 cm thin pink  papule  Sebaceous hyperplasia face  Benign-appearing.  Observation.  Call clinic for new or changing lesions.     Lentigines - Scattered tan macules - Due to sun exposure - Benign-appearing, observe - Recommend daily broad spectrum sunscreen SPF 30+ to sun-exposed areas, reapply every 2 hours as needed. - Call for any changes  Seborrheic Keratoses - Stuck-on, waxy, tan-brown papules and/or plaques  - Benign-appearing - Discussed benign etiology and prognosis. - Observe - Call for any changes  Melanocytic Nevi - Tan-brown and/or pink-flesh-colored symmetric macules and papules - Benign appearing on exam today - Observation - Call clinic for new or changing moles - Recommend daily use of broad spectrum spf 30+ sunscreen to sun-exposed areas.   Hemangiomas - Red papules - Discussed benign nature - Observe - Call for any changes  Actinic Damage - Chronic condition, secondary to cumulative UV/sun exposure - diffuse scaly erythematous macules with underlying dyspigmentation - Recommend daily broad spectrum sunscreen SPF 30+ to sun-exposed areas, reapply every 2 hours as needed.  - Staying in the shade or wearing long sleeves, sun glasses (UVA+UVB protection) and wide brim hats (4-inch brim around the entire circumference of the hat) are also recommended for sun protection.  - Call for new or changing lesions.  Skin cancer screening performed today.  History of Basal Cell Carcinoma of the Skin - No evidence of recurrence today at right shoulder - Recommend regular full body skin exams - Recommend daily broad spectrum sunscreen SPF 30+ to sun-exposed areas, reapply every 2 hours as needed.  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  [name], RMA, am acting as scribe for Forest Gleason, MD .  Documentation: I have reviewed the above documentation for accuracy and completeness, and I agree with the above.  Forest Gleason, MD

## 2022-01-03 ENCOUNTER — Other Ambulatory Visit: Payer: Self-pay

## 2022-01-03 ENCOUNTER — Telehealth: Payer: Self-pay

## 2022-01-03 DIAGNOSIS — I10 Essential (primary) hypertension: Secondary | ICD-10-CM

## 2022-01-03 MED ORDER — CHLORTHALIDONE 25 MG PO TABS: 25.0000 mg | ORAL_TABLET | ORAL | 3 refills | Status: DC

## 2022-01-03 NOTE — Telephone Encounter (Signed)
Discussed with pt okay to have Pratt treated at his follow up visit in October

## 2022-01-03 NOTE — Telephone Encounter (Signed)
ED&C in October is fine for this. Thank you!

## 2022-01-03 NOTE — Telephone Encounter (Addendum)
Called and discussed results with patient. He verbalized understanding and would like to schedule ED&C to be done at next follow up in October.   Will consult with provider.   ----- Message from Alfonso Patten, MD sent at 01/03/2022 10:32 AM EDT ----- Skin , left neck postauricular SUPERFICIAL BASAL CELL CARCINOMA, CLOSE TO MARGIN --> ED&C  MAs please call to discuss and schedule. Thank you!

## 2022-01-04 ENCOUNTER — Encounter: Payer: Self-pay | Admitting: Dermatology

## 2022-01-06 ENCOUNTER — Other Ambulatory Visit: Payer: Self-pay | Admitting: Family Medicine

## 2022-02-14 ENCOUNTER — Other Ambulatory Visit: Payer: Self-pay | Admitting: Family Medicine

## 2022-02-23 ENCOUNTER — Other Ambulatory Visit: Payer: Self-pay | Admitting: Family Medicine

## 2022-03-20 ENCOUNTER — Ambulatory Visit (INDEPENDENT_AMBULATORY_CARE_PROVIDER_SITE_OTHER): Payer: Medicare Other | Admitting: Internal Medicine

## 2022-03-20 ENCOUNTER — Encounter: Payer: Self-pay | Admitting: Internal Medicine

## 2022-03-20 VITALS — BP 136/92 | HR 63 | Temp 99.2°F | Resp 16 | Wt 225.0 lb

## 2022-03-20 DIAGNOSIS — R051 Acute cough: Secondary | ICD-10-CM | POA: Diagnosis not present

## 2022-03-20 DIAGNOSIS — U071 COVID-19: Secondary | ICD-10-CM | POA: Diagnosis not present

## 2022-03-20 LAB — POC COVID19 BINAXNOW: SARS Coronavirus 2 Ag: POSITIVE — AB

## 2022-03-20 MED ORDER — HYDROCODONE BIT-HOMATROP MBR 5-1.5 MG/5ML PO SOLN: 5.0000 mL | Freq: Every evening | ORAL | 0 refills | Status: DC | PRN

## 2022-03-20 MED ORDER — MOLNUPIRAVIR 200 MG PO CAPS: 4.0000 | ORAL_CAPSULE | Freq: Two times a day (BID) | ORAL | 0 refills | Status: DC

## 2022-03-20 NOTE — Assessment & Plan Note (Addendum)
Highly positive test No SOB or distress---discussed ER if SOB Will treat with molnupiravir Hydrocodone cough syrup Tylenol Home for 5 days at least---then mask. Doesn't work
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
## 2022-03-23 ENCOUNTER — Telehealth: Payer: Self-pay | Admitting: Cardiovascular Disease

## 2022-03-23 ENCOUNTER — Telehealth: Payer: Self-pay | Admitting: Family Medicine

## 2022-03-23 DIAGNOSIS — I10 Essential (primary) hypertension: Secondary | ICD-10-CM | POA: Diagnosis not present

## 2022-03-23 DIAGNOSIS — R001 Bradycardia, unspecified: Secondary | ICD-10-CM | POA: Diagnosis not present

## 2022-03-23 DIAGNOSIS — R059 Cough, unspecified: Secondary | ICD-10-CM | POA: Diagnosis not present

## 2022-03-23 NOTE — Telephone Encounter (Signed)
Pt called back stating that he is going to speak with his cardiologist about the irregular heart rates he has been having.

## 2022-03-23 NOTE — Telephone Encounter (Signed)
Spoke to pt. Pt said he is going to stop both the cough syrup and the molnupiravir.

## 2022-03-23 NOTE — Telephone Encounter (Signed)
STAT if HR is under 50 or over 120 (normal HR is 60-100 beats per minute)  What is your heart rate?   Do you have a log of your heart rate readings (document readings)?  46-47  Do you have any other symptoms? Pt states he had to call the paramedics this morning because his breathing doesn't seem right. They checked his bp and it was 172/100. Pt states he also currently has covid and his symptoms started on Monday.

## 2022-03-23 NOTE — Telephone Encounter (Signed)
Patient stated on the phone that he is not taking the cough medicine right now as his cough is better

## 2022-03-23 NOTE — Telephone Encounter (Signed)
Noted. Thanks. Please update [place] as needed.

## 2022-03-23 NOTE — Telephone Encounter (Signed)
Called both telephone numbers listed for the patient. Unable to lmom on either number. There is no voicemail option.  Patient needs to contact his pcp Dr. Damita Dunnings for COVID recommendation. Pt BP is managed by pcp, including Metoprolol which may be contributing to low HR.

## 2022-03-23 NOTE — Telephone Encounter (Signed)
Pt called stating he was seen on 03/20/22 with [place] for possible covid, covid test was +. Pt states he still is having difficulty resting, his blood pressure is high & heart rate is low. Pt called 911 this morning, 03/23/22, heart rate was in the 40s & 50s, pt stated normally his heart rate is 50s & 60s. Pt wanted to know the meds prescribed molnupiravir EUA (LAGEVRIO) 200 MG CAPS capsule & HYDROcodone bit-homatropine (HYCODAN) 5-1.5 MG/5ML syrup, should he continue to take meds with his condition? Call back # [phone]

## 2022-03-24 NOTE — Telephone Encounter (Addendum)
2nd attempt to contact the patient. Called both telephone numbers listed for the patient. Unable to lmom on either number. There is no voicemail option.

## 2022-03-30 ENCOUNTER — Encounter: Payer: Self-pay | Admitting: Family Medicine

## 2022-03-30 ENCOUNTER — Ambulatory Visit (INDEPENDENT_AMBULATORY_CARE_PROVIDER_SITE_OTHER): Payer: Medicare Other | Admitting: Family Medicine

## 2022-03-30 DIAGNOSIS — U071 COVID-19: Secondary | ICD-10-CM | POA: Diagnosis not present

## 2022-03-30 MED ORDER — ATORVASTATIN CALCIUM 40 MG PO TABS
40.0000 mg | ORAL_TABLET | ORAL | 2 refills | Status: DC
Start: 2022-03-30 — End: 2022-05-15

## 2022-03-30 NOTE — Progress Notes (Signed)
Covid f/u.  Tomorrow will be 2 weeks since sx onset.  Positive 10 days ago.  He was able to take molnupiravir for a few days.  Then after the fact his breathing didn't feel right and called 911.  Stopped molnupiravir and cough med on 03/23/22.  He declined ER eval after EMS eval.  Pulse has been in the 50-60s recently.  Lowest pulse was mid 40s last week.  Occ "tickle" with drinking something cold that will cause a cough.  No sputum.  No FCNAVD.  No syncope.  Not lightheaded.  Fortunately he clearly feels better in the meantime.  He had aches with daily lipitor and changed to QOD dosing.    Meds, vitals, and allergies reviewed.   ROS: Per HPI unless specifically indicated in ROS section   GEN: nad, alert and oriented HEENT: ncat  NECK: supple w/o LA CV: rrr.  PULM: ctab, no inc wob ABD: soft, +bs EXT: no edema SKIN: no acute rash

## 2022-03-30 NOTE — Patient Instructions (Signed)
Stop the metoprolol for now and I'll await the cardiology notes.   Update me as needed.   Take care.  Glad to see you. The cough should gradually get better.

## 2022-04-02 NOTE — Assessment & Plan Note (Signed)
Resolving.  Lungs clear.  He clearly feels better.  He had issues with bradycardia recently without syncope.  He is going to follow-up with cardiology and I will await that note. I think it makes sense to stop metoprolol in the meantime.  Rationale discussed with patient. I would expect that his cough should continue to improve gradually.

## 2022-04-05 ENCOUNTER — Ambulatory Visit: Payer: Medicare Other | Admitting: Dermatology

## 2022-04-05 DIAGNOSIS — L820 Inflamed seborrheic keratosis: Secondary | ICD-10-CM | POA: Diagnosis not present

## 2022-04-05 DIAGNOSIS — L578 Other skin changes due to chronic exposure to nonionizing radiation: Secondary | ICD-10-CM | POA: Diagnosis not present

## 2022-04-05 DIAGNOSIS — L570 Actinic keratosis: Secondary | ICD-10-CM

## 2022-04-05 DIAGNOSIS — C4441 Basal cell carcinoma of skin of scalp and neck: Secondary | ICD-10-CM | POA: Diagnosis not present

## 2022-04-05 NOTE — Progress Notes (Signed)
Follow-Up Visit   Subjective  Kevin Charles is a 61 y.o. male who presents for the following: Follow-up (Patient here today for treatment of bx proven bcc at left neck postauricular. Patient would like spot at top of right scalp treated. ).  The following portions of the chart were reviewed this encounter and updated as appropriate:  Tobacco  Allergies  Meds  Problems  Med Hx  Surg Hx  Fam Hx      Review of Systems: No other skin or systemic complaints except as noted in HPI or Assessment and Plan.   Objective  Well appearing patient in no apparent distress; mood and affect are within normal limits.  A focused examination was performed including scalp, face, neck. Relevant physical exam findings are noted in the Assessment and Plan.  Left neck postauricular Pink papule  right scalp x 1 Erythematous stuck-on, waxy papule or plaque  upper mid forehead x1 Erythematous thin papules/macules with gritty scale.    Assessment & Plan  Basal cell carcinoma, scalp/neck Left neck postauricular  Destruction of lesion  Destruction method: electrodesiccation and curettage   Informed consent: discussed and consent obtained   Timeout:  patient name, date of birth, surgical site, and procedure verified Anesthesia: the lesion was anesthetized in a standard fashion   Anesthetic:  1% lidocaine w/ epinephrine 1-100,000 buffered w/ 8.4% NaHCO3 Curettage performed in three different directions: Yes   Electrodesiccation performed over the curetted area: Yes   Curettage cycles:  3 Final wound size (cm):  1.5 Hemostasis achieved with:  electrodesiccation Outcome: patient tolerated procedure well with no complications   Post-procedure details: sterile dressing applied and wound care instructions given   Dressing type: petrolatum    Bx proven bcc      Inflamed seborrheic keratosis right scalp x 1  Symptomatic, irritating, patient would like treated.  Destruction of lesion -  right scalp x 1  Destruction method: cryotherapy   Informed consent: discussed and consent obtained   Lesion destroyed using liquid nitrogen: Yes   Cryotherapy cycles:  2 Outcome: patient tolerated procedure well with no complications   Post-procedure details: wound care instructions given   Additional details:  Prior to procedure, discussed risks of blister formation, small wound, skin dyspigmentation, or rare scar following cryotherapy. Recommend Vaseline ointment to treated areas while healing.   Actinic keratosis upper mid forehead x1  Actinic keratoses are precancerous spots that appear secondary to cumulative UV radiation exposure/sun exposure over time. They are chronic with expected duration over 1 year. A portion of actinic keratoses will progress to squamous cell carcinoma of the skin. It is not possible to reliably predict which spots will progress to skin cancer and so treatment is recommended to prevent development of skin cancer.  Recommend daily broad spectrum sunscreen SPF 30+ to sun-exposed areas, reapply every 2 hours as needed.  Recommend staying in the shade or wearing long sleeves, sun glasses (UVA+UVB protection) and wide brim hats (4-inch brim around the entire circumference of the hat). Call for new or changing lesions.  Destruction of lesion - upper mid forehead x1  Destruction method: cryotherapy   Informed consent: discussed and consent obtained   Lesion destroyed using liquid nitrogen: Yes   Cryotherapy cycles:  2 Outcome: patient tolerated procedure well with no complications   Post-procedure details: wound care instructions given   Additional details:  Prior to procedure, discussed risks of blister formation, small wound, skin dyspigmentation, or rare scar following cryotherapy. Recommend Vaseline ointment to  treated areas while healing.    Actinic Damage - Severe, confluent actinic changes with pre-cancerous actinic keratoses  - Severe, chronic, not at  goal, secondary to cumulative UV radiation exposure over time - diffuse scaly erythematous macules and papules with underlying dyspigmentation - Discussed Prescription "Field Treatment" for Severe, Chronic Confluent Actinic Changes with Pre-Cancerous Actinic Keratoses Field treatment involves treatment of an entire area of skin that has confluent Actinic Changes (Sun/ Ultraviolet light damage) and PreCancerous Actinic Keratoses by method of PhotoDynamic Therapy (PDT) and/or prescription Topical Chemotherapy agents such as 5-fluorouracil, 5-fluorouracil/calcipotriene, and/or imiquimod.  The purpose is to decrease the number of clinically evident and subclinical PreCancerous lesions to prevent progression to development of skin cancer by chemically destroying early precancer changes that may or may not be visible.  It has been shown to reduce the risk of developing skin cancer in the treated area. As a result of treatment, redness, scaling, crusting, and open sores may occur during treatment course. One or more than one of these methods may be used and may have to be used several times to control, suppress and eliminate the PreCancerous changes. Discussed treatment course, expected reaction, and possible side effects. - Recommend daily broad spectrum sunscreen SPF 30+ to sun-exposed areas, reapply every 2 hours as needed.  - Staying in the shade or wearing long sleeves, sun glasses (UVA+UVB protection) and wide brim hats (4-inch brim around the entire circumference of the hat) are also recommended. - Call for new or changing lesions. - Will schedule photodynamic therapy to the scalp and top of ears. May need 2 rounds    Return for jan or feb ak follow up , 6 month tbse hx of bcc . I, Ruthell Rummage, CMA, am acting as scribe for Forest Gleason, MD.  Documentation: I have reviewed the above documentation for accuracy and completeness, and I agree with the above.  Forest Gleason, MD

## 2022-04-05 NOTE — Patient Instructions (Addendum)
Electrodesiccation and Curettage ("Scrape and Burn") Wound Care Instructions  Leave the original bandage on for 24 hours if possible.  If the bandage becomes soaked or soiled before that time, it is OK to remove it and examine the wound.  A small amount of post-operative bleeding is normal.  If excessive bleeding occurs, remove the bandage, place gauze over the site and apply continuous pressure (no peeking) over the area for 30 minutes. If this does not work, please call our clinic as soon as possible or page your doctor if it is after hours.   Once a day, cleanse the wound with soap and water. It is fine to shower. If a thick crust develops you may use a Q-tip dipped into dilute hydrogen peroxide (mix 1:1 with water) to dissolve it.  Hydrogen peroxide can slow the healing process, so use it only as needed.    After washing, apply petroleum jelly (Vaseline) or an antibiotic ointment if your doctor prescribed one for you, followed by a bandage.    For best healing, the wound should be covered with a layer of ointment at all times. If you are not able to keep the area covered with a bandage to hold the ointment in place, this may mean re-applying the ointment several times a day.  Continue this wound care until the wound has healed and is no longer open. It may take several weeks for the wound to heal and close.  Itching and mild discomfort is normal during the healing process.  If you have any discomfort, you can take Tylenol (acetaminophen) or ibuprofen as directed on the bottle. (Please do not take these if you have an allergy to them or cannot take them for another reason).  Some redness, tenderness and white or yellow material in the wound is normal healing.  If the area becomes very sore and red, or develops a thick yellow-green material (pus), it may be infected; please notify us.    Wound healing continues for up to one year following surgery. It is not unusual to experience pain in the scar  from time to time during the interval.  If the pain becomes severe or the scar thickens, you should notify the office.    A slight amount of redness in a scar is expected for the first six months.  After six months, the redness will fade and the scar will soften and fade.  The color difference becomes less noticeable with time.  If there are any problems, return for a post-op surgery check at your earliest convenience.  To improve the appearance of the scar, you can use silicone scar gel, cream, or sheets (such as Mederma or Serica) every night for up to one year. These are available over the counter (without a prescription).  Please call our office at [phone] for any questions or concerns.   Can start in the next few weeks  CPT code to give insurance company for blue or red light therapy 96567   Photodynamic Therapy/Blue Light Therapy  Actinic keratoses are the dry, red scaly spots on the skin caused by sun damage. A portion of these spots can turn into skin cancer with time, and treating them can help prevent development of skin cancer.   Treatment of these spots requires removal of the defective skin cells. There are various ways to remove actinic keratoses, including freezing with liquid nitrogen, treatment with creams, or treatment with a blue light procedure in the office.   Photodynamic Therapy (PDT),  also known as "blue light therapy" is an in office procedure used to treat actinic keratoses. It works by targeting precancerous cells. After treatment, these cells peel off and are replaced by healthy ones.   For your phototherapy appointment, you will have two appointments on the day of your treatment. The first appointment will be to apply a cream to the treatment area. You will leave this cream on for 1-2 hours depending on the area being treated. The second appointment will be to shine a blue light on the area for 16 minutes to kill off the precancer cells. It is common to  experience a burning sensation during the treatment.  After your treatment, it will be important to keep the treated areas of skin out of the sun completely for 48-72 hours (2-3 days) to prevent having a reaction.   Common side effects include: - Burning or stinging, which may be severe and can last up to 24-72 hours after your treatment - Scaling and crusting which may last up to 2 weeks - Redness, swelling and/or peeling which can last up to 4 weeks  To Care for Your Skin After PDT/Blue Light Therapy: - Wash with soap, water and shampoo as normal. - If needed, you can use cold compresses (e.g. ice packs) for comfort - If okay with your primary care doctor, you may use analgesics such as acetaminophen (tylenol) every 4-6 hours, not to exceed recommended dose - You may apply Cerave Healing Ointment, Vaseline or Aquaphor as needed - If you have a lot of swelling you may take a Benadryl to help with this (this may cause drowsiness), not to exceed recommended dose. This may increase the risk of falls in people over 65 and may slow reaction time while driving, so it is not recommended to take before driving or operating machinery. - Sun Precautions - Wear a wide brim hat for the next week if outside  - Wear a sunblock with zinc or titanium dioxide at least SPF 50 daily  If you have any questions or concerns, please call the office and ask to speak with a nurse.   --------------------------------------------------------------------------------------------------------------   If decide not to do light treatment  Can start cream you have at home 5 Fluorouracil / calcipotriene cream twice daily for 2 weeks. If you do not get red irritated or crusty the cream is no longer good. Give [place] a call you will need a new prescription.    New rx if we send in   - Start 5-fluorouracil/calcipotriene cream twice a day for 7 days to affected areas including scalp and top of ears . Prescription sent to [facility]. Patient advised they will receive an email to purchase the medication online and have it sent to their home. Patient provided with handout reviewing treatment course and side effects and advised to call or message [place] on MyChart with any concerns.  Reviewed course of treatment and expected reaction.  Patient advised to expect inflammation and crusting and advised that erosions are possible.  Patient advised to be diligent with sun protection during and after treatment. Counseled to keep medication out of reach of children and pets.    5-Fluorouracil/Calcipotriene Patient Education   Actinic keratoses are the dry, red scaly spots on the skin caused by sun damage. A portion of these spots can turn into skin cancer with time, and treating them can help prevent development of skin cancer.   Treatment of these spots requires removal of the defective  skin cells. There are various ways to remove actinic keratoses, including freezing with liquid nitrogen, treatment with creams, or treatment with a blue light procedure in the office.   5-fluorouracil cream is a topical cream used to treat actinic keratoses. It works by interfering with the growth of abnormal fast-growing skin cells, such as actinic keratoses. These cells peel off and are replaced by healthy ones.   5-fluorouracil/calcipotriene is a combination of the 5-fluorouracil cream with a vitamin D analog cream called calcipotriene. The calcipotriene alone does not treat actinic keratoses. However, when it is combined with 5-fluorouracil, it helps the 5-fluorouracil treat the actinic keratoses much faster so that the same results can be achieved with a much shorter treatment time.  INSTRUCTIONS FOR 5-FLUOROURACIL/CALCIPOTRIENE CREAM:   5-fluorouracil/calcipotriene cream typically only needs to be used for 4-7 days. A thin layer should be applied twice a day to the treatment areas recommended by your physician.   If  your physician prescribed you separate tubes of 5-fluourouracil and calcipotriene, apply a thin layer of 5-fluorouracil followed by a thin layer of calcipotriene.   Avoid contact with your eyes, nostrils, and mouth. Do not use 5-fluorouracil/calcipotriene cream on infected or open wounds.   You will develop redness, irritation and some crusting at areas where you have pre-cancer damage/actinic keratoses. IF YOU DEVELOP PAIN, BLEEDING, OR SIGNIFICANT CRUSTING, STOP THE TREATMENT EARLY - you have already gotten a good response and the actinic keratoses should clear up well.  Wash your hands after applying 5-fluorouracil 5% cream on your skin.   A moisturizer or sunscreen with a minimum SPF 30 should be applied each morning.   Once you have finished the treatment, you can apply a thin layer of Vaseline twice a day to irritated areas to soothe and calm the areas more quickly. If you experience significant discomfort, contact your physician.  For some patients it is necessary to repeat the treatment for best results.  SIDE EFFECTS: When using 5-fluorouracil/calcipotriene cream, you may have mild irritation, such as redness, dryness, swelling, or a mild burning sensation. This usually resolves within 2 weeks. The more actinic keratoses you have, the more redness and inflammation you can expect during treatment. Eye irritation has been reported rarely. If this occurs, please let us know.  If you have any trouble using this cream, please call the office. If you have any other questions about this information, please do not hesitate to ask me before you leave the office.     Seborrheic Keratosis  What causes seborrheic keratoses? Seborrheic keratoses are harmless, common skin growths that first appear during adult life.  As time goes by, more growths appear.  Some people may develop a large number of them.  Seborrheic keratoses appear on both covered and uncovered body parts.  They are not caused by  sunlight.  The tendency to develop seborrheic keratoses can be inherited.  They vary in color from skin-colored to gray, brown, or even black.  They can be either smooth or have a rough, warty surface.   Seborrheic keratoses are superficial and look as if they were stuck on the skin.  Under the microscope this type of keratosis looks like layers upon layers of skin.  That is why at times the top layer may seem to fall off, but the rest of the growth remains and re-grows.    Treatment Seborrheic keratoses do not need to be treated, but can easily be removed in the office.  Seborrheic keratoses often cause symptoms when  they rub on clothing or jewelry.  Lesions can be in the way of shaving.  If they become inflamed, they can cause itching, soreness, or burning.  Removal of a seborrheic keratosis can be accomplished by freezing, burning, or surgery. If any spot bleeds, scabs, or grows rapidly, please return to have it checked, as these can be an indication of a skin cancer.  Cryotherapy Aftercare  Wash gently with soap and water everyday.   Apply Vaseline and Band-Aid daily until healed.     Actinic keratoses are precancerous spots that appear secondary to cumulative UV radiation exposure/sun exposure over time. They are chronic with expected duration over 1 year. A portion of actinic keratoses will progress to squamous cell carcinoma of the skin. It is not possible to reliably predict which spots will progress to skin cancer and so treatment is recommended to prevent development of skin cancer.  Recommend daily broad spectrum sunscreen SPF 30+ to sun-exposed areas, reapply every 2 hours as needed.  Recommend staying in the shade or wearing long sleeves, sun glasses (UVA+UVB protection) and wide brim hats (4-inch brim around the entire circumference of the hat). Call for new or changing lesions.     Due to recent changes in healthcare laws, you may see results of your pathology and/or laboratory  studies on MyChart before the doctors have had a chance to review them. We understand that in some cases there may be results that are confusing or concerning to you. Please understand that not all results are received at the same time and often the doctors may need to interpret multiple results in order to provide you with the best plan of care or course of treatment. Therefore, we ask that you please give [place] 2 business days to thoroughly review all your results before contacting the office for clarification. Should we see a critical lab result, you will be contacted sooner.   If You Need Anything After Your Visit  If you have any questions or concerns for your doctor, please call our main line at [phone] and press option 4 to reach your doctor's medical assistant. If no one answers, please leave a voicemail as directed and we will return your call as soon as possible. Messages left after 4 pm will be answered the following business day.   You may also send [place] a message via [place]. We typically respond to MyChart messages within 1-2 business days.  For prescription refills, please ask your pharmacy to contact our office. Our fax number is [phone].  If you have an urgent issue when the clinic is closed that cannot wait until the next business day, you can page your doctor at the number below.    Please note that while we do our best to be available for urgent issues outside of office hours, we are not available 24/7.   If you have an urgent issue and are unable to reach [place], you may choose to seek medical care at your doctor's office, retail clinic, urgent care center, or emergency room.  If you have a medical emergency, please immediately call 911 or go to the emergency department.  Pager Numbers  - Dr. Nehemiah Massed: [phone]  - Dr. Laurence Ferrari: [phone]  - Dr. Nicole Kindred: [phone]  In the event of inclement weather, please call our main line at [phone] for an update on the  status of any delays or closures.  Dermatology Medication Tips: Please keep the boxes that topical medications come in in order to help keep  track of the instructions about where and how to use these. Pharmacies typically print the medication instructions only on the boxes and not directly on the medication tubes.   If your medication is too expensive, please contact our office at [phone] option 4 or send [place] a message through [place].   We are unable to tell what your co-pay for medications will be in advance as this is different depending on your insurance coverage. However, we may be able to find a substitute medication at lower cost or fill out paperwork to get insurance to cover a needed medication.   If a prior authorization is required to get your medication covered by your insurance company, please allow [place] 1-2 business days to complete this process.  Drug prices often vary depending on where the prescription is filled and some pharmacies may offer cheaper prices.  The website www.goodrx.com contains coupons for medications through different pharmacies. The prices here do not account for what the cost may be with help from insurance (it may be cheaper with your insurance), but the website can give you the price if you did not use any insurance.  - You can print the associated coupon and take it with your prescription to the pharmacy.  - You may also stop by our office during regular business hours and pick up a GoodRx coupon card.  - If you need your prescription sent electronically to a different pharmacy, notify our office through [facility] or by phone at [phone] option 4.     Si Usted Necesita Algo Despus de Su Visita  Tambin puede enviarnos un mensaje a travs de Pharmacist, community. Por lo general respondemos a los mensajes de MyChart en el transcurso de 1 a 2 das hbiles.  Para renovar recetas, por favor pida a su farmacia que se ponga en contacto con nuestra oficina.  Harland Dingwall de fax es [place] [phone].  Si tiene un asunto urgente cuando la clnica est cerrada y que no puede esperar hasta el siguiente da hbil, puede llamar/localizar a su doctor(a) al nmero que aparece a continuacin.   Por favor, tenga en cuenta que aunque hacemos todo lo posible para estar disponibles para asuntos urgentes fuera del horario de [place], no estamos disponibles las 24 horas del da, los 7 das de la [place].   Si tiene un problema urgente y no puede comunicarse con nosotros, puede optar por buscar atencin mdica  en el consultorio de su doctor(a), en una clnica privada, en un centro de atencin urgente o en una sala de emergencias.  Si tiene Engineering geologist, por favor llame inmediatamente al 911 o vaya a la sala de emergencias.  Nmeros de bper  - Dr. Nehemiah Massed: [phone]  - Dra. Moye: [phone]  - Dra. Nicole Kindred: [phone]  En caso de inclemencias del [place], por favor llame a Johnsie Kindred principal al [phone] para una actualizacin sobre el [place] de cualquier retraso o cierre.  Consejos para la medicacin en dermatologa: Por favor, guarde las cajas en las que vienen los medicamentos de uso tpico para ayudarle a seguir las instrucciones sobre dnde y cmo usarlos. Las farmacias generalmente imprimen las instrucciones del medicamento slo en las cajas y no directamente en los tubos del [place].   Si su medicamento es muy caro, por favor, pngase en contacto con Zigmund Daniel llamando al [phone] y presione la opcin 4 o envenos un mensaje a travs de Pharmacist, community.   No podemos decirle cul ser su copago por los medicamentos por  adelantado ya que esto es diferente dependiendo de la cobertura de su seguro. Sin embargo, es posible que podamos encontrar un medicamento sustituto a Electrical engineer un formulario para que el seguro cubra el medicamento que se considera necesario.   Si se requiere una autorizacin previa para que su  compaa de seguros [place] su medicamento, por favor permtanos de 1 a 2 das hbiles para completar este proceso.  Los precios de los medicamentos varan con frecuencia dependiendo del Environmental consultant de dnde se surte la receta y alguna farmacias pueden ofrecer precios ms baratos.  El sitio web www.goodrx.com tiene cupones para medicamentos de Airline pilot. Los precios aqu no tienen en cuenta lo que podra costar con la ayuda del seguro (puede ser ms barato con su seguro), pero el sitio web puede darle el precio si no utiliz Research scientist (physical sciences).  - Puede imprimir el cupn correspondiente y llevarlo con su receta a la farmacia.  - Tambin puede pasar por nuestra oficina durante el horario de atencin regular y Charity fundraiser una tarjeta de cupones de GoodRx.  - Si necesita que su receta se enve electrnicamente a una farmacia diferente, informe a nuestra oficina a travs de MyChart de [place] o por telfono llamando al [phone] y presione la opcin 4.
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
## 2022-04-13 ENCOUNTER — Ambulatory Visit: Payer: Medicare Other | Attending: Nurse Practitioner | Admitting: Student

## 2022-04-13 ENCOUNTER — Encounter: Payer: Self-pay | Admitting: Nurse Practitioner

## 2022-04-13 ENCOUNTER — Encounter: Payer: Self-pay | Admitting: Dermatology

## 2022-04-13 VITALS — BP 142/82 | HR 64 | Ht 71.0 in | Wt 225.0 lb

## 2022-04-13 DIAGNOSIS — I2510 Atherosclerotic heart disease of native coronary artery without angina pectoris: Secondary | ICD-10-CM

## 2022-04-13 DIAGNOSIS — E785 Hyperlipidemia, unspecified: Secondary | ICD-10-CM

## 2022-04-13 DIAGNOSIS — R001 Bradycardia, unspecified: Secondary | ICD-10-CM

## 2022-04-13 DIAGNOSIS — I10 Essential (primary) hypertension: Secondary | ICD-10-CM

## 2022-04-13 MED ORDER — LOSARTAN POTASSIUM 25 MG PO TABS: 25.0000 mg | ORAL_TABLET | Freq: Every day | ORAL | 0 refills | Status: DC

## 2022-04-13 MED ORDER — LOSARTAN POTASSIUM 25 MG PO TABS: 25.0000 mg | ORAL_TABLET | Freq: Every day | ORAL | 3 refills | Status: DC

## 2022-04-13 NOTE — Patient Instructions (Signed)
Medication Instructions:  Your physician has recommended you make the following change in your medication:   START Losartan 25 mg once daily   *If you need a refill on your cardiac medications before your next appointment, please call your pharmacy*   Lab Work: BMET in one week. No appointment is needed for this. Just go over at the following location: [place] at [facility] 1st desk on the right to check in (REGISTRATION)  Lab hours: Monday- Friday (7:30 am- 5:30 pm)  If you have labs (blood work) drawn today and your tests are completely normal, you will receive your results only by: MyChart Message (if you have MyChart) OR A paper copy in the mail If you have any lab test that is abnormal or we need to change your treatment, we will call you to review the results.   Testing/Procedures: None   Follow-Up: At [facility], you and your health needs are our priority.  As part of our continuing mission to provide you with exceptional heart care, we have created designated Provider Care Teams.  These Care Teams include your primary Cardiologist (physician) and Advanced Practice Providers (APPs -  Physician Assistants and Nurse Practitioners) who all work together to provide you with the care you need, when you need it.  We recommend signing up for the patient portal called "MyChart".  Sign up information is provided on this After Visit Summary.  MyChart is used to connect with patients for Virtual Visits (Telemedicine).  Patients are able to view lab/test results, encounter notes, upcoming appointments, etc.  Non-urgent messages can be sent to your provider as well.   To learn more about what you can do with MyChart, go to [email or web address].    Your next appointment:   3 month(s)  The format for your next appointment:   In Person  Provider:   Kathlyn Sacramento, MD or Murray Hodgkins, NP       Important Information About Sugar

## 2022-04-20 ENCOUNTER — Other Ambulatory Visit
Admission: RE | Admit: 2022-04-20 | Discharge: 2022-04-20 | Disposition: A | Discharge status: Home / Self Care | Payer: Medicare Other | Attending: Nurse Practitioner | Admitting: Nurse Practitioner

## 2022-04-20 DIAGNOSIS — I2510 Atherosclerotic heart disease of native coronary artery without angina pectoris: Secondary | ICD-10-CM | POA: Diagnosis not present

## 2022-04-20 LAB — BASIC METABOLIC PANEL
Anion gap: 6 (ref 5–15)
BUN: 17 mg/dL (ref 8–23)
CO2: 29 mmol/L (ref 22–32)
Calcium: 9.5 mg/dL (ref 8.9–10.3)
Chloride: 104 mmol/L (ref 98–111)
Creatinine, Ser: 0.78 mg/dL (ref 0.61–1.24)
GFR, Estimated: >60 mL/min (ref >60)
Glucose, Bld: 98 mg/dL (ref 70–99)
Potassium: 4.6 mmol/L (ref 3.5–5.1)
Sodium: 139 mmol/L (ref 135–145)

## 2022-05-12 ENCOUNTER — Other Ambulatory Visit: Payer: Self-pay | Admitting: Cardiovascular Disease

## 2022-05-29 ENCOUNTER — Other Ambulatory Visit: Payer: Self-pay | Admitting: Family Medicine

## 2022-06-07 ENCOUNTER — Ambulatory Visit (INDEPENDENT_AMBULATORY_CARE_PROVIDER_SITE_OTHER): Payer: Medicare Other

## 2022-06-07 ENCOUNTER — Other Ambulatory Visit: Payer: Self-pay | Admitting: Family Medicine

## 2022-06-07 VITALS — Ht 71.0 in | Wt 225.0 lb

## 2022-06-07 DIAGNOSIS — Z0000 Encounter for general adult medical examination without abnormal findings: Secondary | ICD-10-CM | POA: Diagnosis not present
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
## 2022-06-07 NOTE — Patient Instructions (Addendum)
Kevin Charles , Thank you for taking time to come for your Medicare Wellness Visit. I appreciate your ongoing commitment to your health goals. Please review the following plan we discussed and let me know if I can assist you in the future.   These are the goals we discussed:  Goals Addressed               This Visit's Progress     Patient Stated     Weight (lb) < 210 lb (95.3 kg) (pt-stated)   225 lb (102.1 kg)     Cut back salt Increase water intake Portion control meals Stay active         This is a list of the screening recommended for you and due dates:  Health Maintenance  Topic Date Due   DTaP/Tdap/Td vaccine (3 - Td or Tdap) 02/04/2022   COVID-19 Vaccine (4 - 2023-24 season) 06/23/2022*   Flu Shot  09/17/2022*   Medicare Annual Wellness Visit  06/08/2023   Colon Cancer Screening  08/31/2023   Hepatitis C Screening: USPSTF Recommendation to screen - Ages 18-79 yo.  Completed   HIV Screening  Completed   Zoster (Shingles) Vaccine  Completed   HPV Vaccine  Aged Out  *Topic was postponed. The date shown is not the original due date.    Advanced directives: End of life planning; Advance aging; Advanced directives discussed.  Copy of current HCPOA/Living Will requested.    Conditions/risks identified: none new  Next appointment: Follow up in one year for your annual wellness visit   Preventive Care 40-64 Years, Male Preventive care refers to lifestyle choices and visits with your health care provider that can promote health and wellness. What does preventive care include? A yearly physical exam. This is also called an annual well check. Dental exams once or twice a year. Routine eye exams. Ask your health care provider how often you should have your eyes checked. Personal lifestyle choices, including: Daily care of your teeth and gums. Regular physical activity. Eating a healthy diet. Avoiding tobacco and drug use. Limiting alcohol use. Practicing safe  sex. Taking low-dose aspirin every day starting at age 51. What happens during an annual well check? The services and screenings done by your health care provider during your annual well check will depend on your age, overall health, lifestyle risk factors, and family history of disease. Counseling  Your health care provider may ask you questions about your: Alcohol use. Tobacco use. Drug use. Emotional well-being. Home and relationship well-being. Sexual activity. Eating habits. Work and work Statistician. Screening  You may have the following tests or measurements: Height, weight, and BMI. Blood pressure. Lipid and cholesterol levels. These may be checked every 5 years, or more frequently if you are over 66 years old. Skin check. Lung cancer screening. You may have this screening every year starting at age 51 if you have a 30-pack-year history of smoking and currently smoke or have quit within the past 15 years. Fecal occult blood test (FOBT) of the stool. You may have this test every year starting at age 27. Flexible sigmoidoscopy or colonoscopy. You may have a sigmoidoscopy every 5 years or a colonoscopy every 10 years starting at age 58. Prostate cancer screening. Recommendations will vary depending on your family history and other risks. Hepatitis C blood test. Hepatitis B blood test. Sexually transmitted disease (STD) testing. Diabetes screening. This is done by checking your blood sugar (glucose) after you have not eaten for a while (  fasting). You may have this done every 1-3 years. Discuss your test results, treatment options, and if necessary, the need for more tests with your health care provider. Vaccines  Your health care provider may recommend certain vaccines, such as: Influenza vaccine. This is recommended every year. Tetanus, diphtheria, and acellular pertussis (Tdap, Td) vaccine. You may need a Td booster every 10 years. Zoster vaccine. You may need this after age  90. Pneumococcal 13-valent conjugate (PCV13) vaccine. You may need this if you have certain conditions and have not been vaccinated. Pneumococcal polysaccharide (PPSV23) vaccine. You may need one or two doses if you smoke cigarettes or if you have certain conditions. Talk to your health care provider about which screenings and vaccines you need and how often you need them. This information is not intended to replace advice given to you by your health care provider. Make sure you discuss any questions you have with your health care provider. Document Released: 07/02/2015 Document Revised: 02/23/2016 Document Reviewed: 04/06/2015 Elsevier Interactive Patient Education  2017 [employer] Prevention in the Home Falls can cause injuries. They can happen to people of all ages. There are many things you can do to make your home safe and to help prevent falls. What can I do on the outside of my home? Regularly fix the edges of walkways and driveways and fix any cracks. Remove anything that might make you trip as you walk through a door, such as a raised step or threshold. Trim any bushes or trees on the path to your home. Use bright outdoor lighting. Clear any walking paths of anything that might make someone trip, such as rocks or tools. Regularly check to see if handrails are loose or broken. Make sure that both sides of any steps have handrails. Any raised decks and porches should have guardrails on the edges. Have any leaves, snow, or ice cleared regularly. Use sand or salt on walking paths during winter. Clean up any spills in your garage right away. This includes oil or grease spills. What can I do in the bathroom? Use night lights. Install grab bars by the toilet and in the tub and shower. Do not use towel bars as grab bars. Use non-skid mats or decals in the tub or shower. If you need to sit down in the shower, use a plastic, non-slip stool. Keep the floor dry. Clean up any water  that spills on the floor as soon as it happens. Remove soap buildup in the tub or shower regularly. Attach bath mats securely with double-sided non-slip rug tape. Do not have throw rugs and other things on the floor that can make you trip. What can I do in the bedroom? Use night lights. Make sure that you have a light by your bed that is easy to reach. Do not use any sheets or blankets that are too big for your bed. They should not hang down onto the floor. Have a firm chair that has side arms. You can use this for support while you get dressed. Do not have throw rugs and other things on the floor that can make you trip. What can I do in the kitchen? Clean up any spills right away. Avoid walking on wet floors. Keep items that you use a lot in easy-to-reach places. If you need to reach something above you, use a strong step stool that has a grab bar. Keep electrical cords out of the way. Do not use floor polish or wax that makes  floors slippery. If you must use wax, use non-skid floor wax. Do not have throw rugs and other things on the floor that can make you trip. What can I do with my stairs? Do not leave any items on the stairs. Make sure that there are handrails on both sides of the stairs and use them. Fix handrails that are broken or loose. Make sure that handrails are as long as the stairways. Check any carpeting to make sure that it is firmly attached to the stairs. Fix any carpet that is loose or worn. Avoid having throw rugs at the top or bottom of the stairs. If you do have throw rugs, attach them to the floor with carpet tape. Make sure that you have a light switch at the top of the stairs and the bottom of the stairs. If you do not have them, ask someone to add them for you. What else can I do to help prevent falls? Wear shoes that: Do not have high heels. Have rubber bottoms. Are comfortable and fit you well. Are closed at the toe. Do not wear sandals. If you use a  stepladder: Make sure that it is fully opened. Do not climb a closed stepladder. Make sure that both sides of the stepladder are locked into place. Ask someone to hold it for you, if possible. Clearly mark and make sure that you can see: Any grab bars or handrails. First and last steps. Where the edge of each step is. Use tools that help you move around (mobility aids) if they are needed. These include: Canes. Walkers. Scooters. Crutches. Turn on the lights when you go into a dark area. Replace any light bulbs as soon as they burn out. Set up your furniture so you have a clear path. Avoid moving your furniture around. If any of your floors are uneven, fix them. If there are any pets around you, be aware of where they are. Review your medicines with your doctor. Some medicines can make you feel dizzy. This can increase your chance of falling. Ask your doctor what other things that you can do to help prevent falls. This information is not intended to replace advice given to you by your health care provider. Make sure you discuss any questions you have with your health care provider. Document Released: 04/01/2009 Document Revised: 11/11/2015 Document Reviewed: 07/10/2014 Elsevier Interactive Patient Education  2017 [employer].

## 2022-06-08 ENCOUNTER — Other Ambulatory Visit: Payer: Medicare Other

## 2022-06-15 ENCOUNTER — Encounter: Payer: Medicare Other | Admitting: Family Medicine

## 2022-06-22 ENCOUNTER — Other Ambulatory Visit: Payer: Self-pay | Admitting: *Deleted

## 2022-06-22 DIAGNOSIS — R972 Elevated prostate specific antigen [PSA]: Secondary | ICD-10-CM

## 2022-06-23 ENCOUNTER — Other Ambulatory Visit: Payer: Medicare Other

## 2022-06-23 DIAGNOSIS — R972 Elevated prostate specific antigen [PSA]: Secondary | ICD-10-CM

## 2022-06-24 LAB — PSA: Prostate Specific Ag, Serum: 3.3 ng/mL (ref 0.0–4.0)

## 2022-06-26 ENCOUNTER — Encounter: Payer: Medicare Other | Admitting: Family Medicine

## 2022-06-26 ENCOUNTER — Encounter: Payer: Self-pay | Admitting: Family Medicine

## 2022-06-26 ENCOUNTER — Ambulatory Visit (INDEPENDENT_AMBULATORY_CARE_PROVIDER_SITE_OTHER): Payer: Medicare Other | Admitting: Family Medicine

## 2022-06-26 VITALS — BP 118/80 | HR 71 | Temp 97.4°F | Ht 71.0 in | Wt 231.0 lb

## 2022-06-26 DIAGNOSIS — G43909 Migraine, unspecified, not intractable, without status migrainosus: Secondary | ICD-10-CM | POA: Diagnosis not present

## 2022-06-26 DIAGNOSIS — Z7189 Other specified counseling: Secondary | ICD-10-CM

## 2022-06-26 DIAGNOSIS — E785 Hyperlipidemia, unspecified: Secondary | ICD-10-CM | POA: Diagnosis not present

## 2022-06-26 DIAGNOSIS — Z0000 Encounter for general adult medical examination without abnormal findings: Secondary | ICD-10-CM

## 2022-06-26 DIAGNOSIS — G40909 Epilepsy, unspecified, not intractable, without status epilepticus: Secondary | ICD-10-CM | POA: Diagnosis not present

## 2022-06-26 DIAGNOSIS — M25519 Pain in unspecified shoulder: Secondary | ICD-10-CM

## 2022-06-26 DIAGNOSIS — I10 Essential (primary) hypertension: Secondary | ICD-10-CM

## 2022-06-26 DIAGNOSIS — E039 Hypothyroidism, unspecified: Secondary | ICD-10-CM

## 2022-06-26 DIAGNOSIS — H00019 Hordeolum externum unspecified eye, unspecified eyelid: Secondary | ICD-10-CM | POA: Diagnosis not present

## 2022-06-26 LAB — CBC WITH DIFFERENTIAL/PLATELET
Basophils Absolute: 0.1 10*3/uL (ref 0.0–0.1)
Basophils Relative: 1 % (ref 0.0–3.0)
Eosinophils Absolute: 0.1 10*3/uL (ref 0.0–0.7)
Eosinophils Relative: 1.6 % (ref 0.0–5.0)
HCT: 50.7 % (ref 39.0–52.0)
Hemoglobin: 17.6 g/dL — ABNORMAL HIGH (ref 13.0–17.0)
Lymphocytes Relative: 37.5 % (ref 12.0–46.0)
Lymphs Abs: 2.2 10*3/uL (ref 0.7–4.0)
MCHC: 34.8 g/dL (ref 30.0–36.0)
MCV: 94.1 fl (ref 78.0–100.0)
Monocytes Absolute: 0.9 10*3/uL (ref 0.1–1.0)
Monocytes Relative: 14.4 % — ABNORMAL HIGH (ref 3.0–12.0)
Neutro Abs: 2.7 10*3/uL (ref 1.4–7.7)
Neutrophils Relative %: 45.5 % (ref 43.0–77.0)
Platelets: 180 10*3/uL (ref 150.0–400.0)
RBC: 5.39 Mil/uL (ref 4.22–5.81)
RDW: 13 % (ref 11.5–15.5)
WBC: 6 10*3/uL (ref 4.0–10.5)

## 2022-06-26 LAB — TSH: TSH: 0.69 u[IU]/mL (ref 0.35–5.50)

## 2022-06-26 LAB — COMPREHENSIVE METABOLIC PANEL
ALT: 37 U/L (ref 0–53)
AST: 31 U/L (ref 0–37)
Albumin: 4.5 g/dL (ref 3.5–5.2)
Alkaline Phosphatase: 71 U/L (ref 39–117)
BUN: 15 mg/dL (ref 6–23)
CO2: 32 mEq/L (ref 19–32)
Calcium: 10.2 mg/dL (ref 8.4–10.5)
Chloride: 102 mEq/L (ref 96–112)
Creatinine, Ser: 0.84 mg/dL (ref 0.40–1.50)
GFR: 93.78 mL/min (ref >60.00)
Glucose, Bld: 87 mg/dL (ref 70–99)
Potassium: 4.5 mEq/L (ref 3.5–5.1)
Sodium: 141 mEq/L (ref 135–145)
Total Bilirubin: 1.1 mg/dL (ref 0.2–1.2)
Total Protein: 7.1 g/dL (ref 6.0–8.3)

## 2022-06-26 LAB — LIPID PANEL
Cholesterol: 159 mg/dL (ref 0–200)
HDL: 34.7 mg/dL — ABNORMAL LOW (ref >39.00)
LDL Cholesterol: 98 mg/dL (ref 0–99)
NonHDL: 124.3
Total CHOL/HDL Ratio: 5
Triglycerides: 133 mg/dL (ref 0.0–149.0)
VLDL: 26.6 mg/dL (ref 0.0–40.0)

## 2022-06-26 MED ORDER — ERYTHROMYCIN 5 MG/GM OP OINT: 1.0000 | TOPICAL_OINTMENT | Freq: Three times a day (TID) | OPHTHALMIC | 0 refills | Status: DC

## 2022-06-26 NOTE — Patient Instructions (Signed)
Go to the lab on the way out.   If you have mychart we'll likely use that to update you.    Take care.  Glad to see you. Erythromycin ointment R eye 3 times daily.

## 2022-06-26 NOTE — Progress Notes (Unsigned)
Advance directive- Kevin Charles designated if patient were incapacitated.  Colonoscopy 2022 PSA 2024 per urology tdap 2013 Flu shot 2023 Shingles 2021 PNA not due yet.   covid vaccine prev done.     Elevated Cholesterol: Using medications without problems: yes Muscle aches: no Diet compliance: yes Exercise: yes Labs pending.   Hypothyroidism.  Compliant.  No ADE on med.  Due for labs.  No neck mass.  No dysphagia.     Hypertension:               Using medication without problems or lightheadedness: yes Chest pain with exertion:no exertional sx, occ mild pinching sensation in the chest just L of midline, over the last few years.  No escalation.  He can clearly exert w/o sx.  I asked him to update me as needed.  Edema: occ, not consistently.   Short of breath:no Labs pending. See notes on labs.     SZ hx noted. Compliant. No events.  He has continued for years w/o events.  FH noted, mother had SZ.  Due for labs.  See notes on labs.     He has still has some episodic migraines.  No ADE on med.  With relief with maxalt.  still used episodically with relief. .     L shoulder pain occ with ROM, putting on a coat.  Pain sleeping on L side some of the time.  He wants to avoid surgery.  d/w pt about updating me as needed.  he is working on ROM at baseline.   R eye stye, lower lid.  Using warm compresses.  Going on for about 4-5 days.    Meds, vitals, and allergies reviewed.   ROS: Per HPI unless specifically indicated in ROS section   GEN: nad, alert and oriented HEENT: ncat, EOMI, R lower lid with stye noted.  Other lids wnl.   NECK: supple w/o LA CV: rrr PULM: ctab, no inc wob ABD: soft, +bs EXT: no edema SKIN: no acute rash

## 2022-06-27 LAB — VALPROIC ACID LEVEL: Valproic Acid Lvl: 98.3 mg/L (ref 50.0–100.0)

## 2022-06-28 ENCOUNTER — Encounter: Payer: Self-pay | Admitting: Urology

## 2022-06-28 ENCOUNTER — Ambulatory Visit (INDEPENDENT_AMBULATORY_CARE_PROVIDER_SITE_OTHER): Payer: Medicare Other | Admitting: Urology

## 2022-06-28 ENCOUNTER — Telehealth: Payer: Self-pay | Admitting: Family Medicine

## 2022-06-28 VITALS — BP 147/90 | HR 66 | Ht 71.0 in | Wt 228.0 lb

## 2022-06-28 DIAGNOSIS — H00019 Hordeolum externum unspecified eye, unspecified eyelid: Secondary | ICD-10-CM | POA: Insufficient documentation

## 2022-06-28 DIAGNOSIS — N3943 Post-void dribbling: Secondary | ICD-10-CM

## 2022-06-28 DIAGNOSIS — Z125 Encounter for screening for malignant neoplasm of prostate: Secondary | ICD-10-CM | POA: Diagnosis not present

## 2022-06-28 NOTE — Assessment & Plan Note (Signed)
Start erythromycin ointment and continue using warm compresses.

## 2022-06-28 NOTE — Assessment & Plan Note (Signed)
Advance directive- Kevin Charles designated if patient were incapacitated.  Colonoscopy 2022 PSA 2024 per urology tdap 2013 Flu shot 2023 Shingles 2021 PNA not due yet.   covid vaccine prev done.

## 2022-06-28 NOTE — Assessment & Plan Note (Signed)
Compliant.  No ADE on med.  Due for labs.  No neck mass.  No dysphagia.  Continue levothyroxine as is.

## 2022-06-28 NOTE — Assessment & Plan Note (Signed)
See above.  I asked him to update me as needed.

## 2022-06-28 NOTE — Assessment & Plan Note (Signed)
SZ hx noted. Compliant. No events.  He has continued for years w/o events.  FH noted, mother had SZ.  See notes on labs.  Continue Depakote as is.

## 2022-06-28 NOTE — Telephone Encounter (Signed)
Spoke with patient and advised about Dr. Josefine Class recommendations. Patient is going to continue as he is and will call his eye doctor to see if he can get in. Will call back if needed.

## 2022-06-28 NOTE — Assessment & Plan Note (Signed)
Continue atorvastatin.  See notes on labs. 

## 2022-06-28 NOTE — Telephone Encounter (Signed)
Pt called asking for status of lab results? Pt also mentioned the stye on his eye is worse than what it was on Mon, 1/8. Pt stated the meds, erythromycin ophthalmic ointment, aren't seeming to work. Pt is asking for advice? Call back # [phone]

## 2022-06-28 NOTE — Progress Notes (Signed)
   06/28/2022 10:26 AM   Kevin Charles [birth year] [id number]  Reason for visit: Follow up PSA screening, urinary symptoms  HPI: 62 year old male with a long history of urinary dribbling for over 20 years that requires a pad during the day.  This is minimally bothersome to him.  It is typically postvoid urinary dribbling, and he denies stress or urge incontinence.  Urinalysis and PVR have been benign in the past.  We have followed him for PSA screening as well, and these have essentially been stable over the last 5 years.  He does have 1 isolated elevated PSA of 4.77 in December 2021, but this decreased to 3.0 on recheck.  PSA this year on 06/23/2022 is stable at 3.3 from 3.01 prior.  We again reviewed the AUA guidelines regarding PSA screening, and that the overall trend is more important than individual values.  His PSA trend is overall very reassuring, and remains within the normal range.  Recommend continuing PSA screening every 1 to 2 years per the AUA guidelines.  Regarding the postvoid dribbling, we discussed options like Kegel exercises or trial of Flomax, but he is not bothered enough at this time to consider medications  RTC 1 year PSA prior, PVR   Billey Co, MD  [facility] [address] [place], [place] [address] [phone]

## 2022-06-28 NOTE — Assessment & Plan Note (Signed)
Continue chlorthalidone and losartan..  See notes on labs.

## 2022-06-28 NOTE — Telephone Encounter (Signed)
I will sign off on his results as soon as I can.  We will call him about that.  Does he have an eye clinic where he can be seen about his eyelid?  That would be the more pressing issue.  I would still use the ointment and warm compresses.  Let me know if he needs an urgent referral to the eye clinic.  Thanks.

## 2022-06-28 NOTE — Assessment & Plan Note (Signed)
See notes on labs.  Continue Maxalt as needed.

## 2022-06-28 NOTE — Assessment & Plan Note (Signed)
Advance directive- Kevin Charles designated if patient were incapacitated.

## 2022-06-28 NOTE — Patient Instructions (Signed)

## 2022-06-29 DIAGNOSIS — H0012 Chalazion right lower eyelid: Secondary | ICD-10-CM | POA: Diagnosis not present

## 2022-06-29 NOTE — Telephone Encounter (Signed)
Noted. Thanks.  I hope he feels better soon.

## 2022-06-29 NOTE — Telephone Encounter (Signed)
Pt called to let Damita Dunnings know that his eye dr prescribed 2 meds: doxycycline 100 & dexamethasone (eye drops) on yesterday, 06/28/22. Pt stated the eye dr instructed him to stop using the meds, erythromycin ophthalmic ointment. Pt asked for lab results, told pt Duncan's response. Pt has no questions/concerns. Call back # [phone]

## 2022-07-13 ENCOUNTER — Ambulatory Visit: Payer: Medicare Other | Admitting: Dermatology

## 2022-07-13 DIAGNOSIS — L570 Actinic keratosis: Secondary | ICD-10-CM

## 2022-07-13 DIAGNOSIS — L578 Other skin changes due to chronic exposure to nonionizing radiation: Secondary | ICD-10-CM

## 2022-07-13 DIAGNOSIS — Z85828 Personal history of other malignant neoplasm of skin: Secondary | ICD-10-CM

## 2022-07-13 NOTE — Progress Notes (Signed)
Follow-Up Visit   Subjective  Kevin Charles is a 62 y.o. male who presents for the following: Follow-up (Patient here today for AK follow up. ).  Patient has not done PDT to scalp or top of ears. He has been dealing with a very large stye at right eye for a few weeks and it is just now finally improving.  The following portions of the chart were reviewed this encounter and updated as appropriate:   Tobacco  Allergies  Meds  Problems  Med Hx  Surg Hx  Fam Hx      Review of Systems:  No other skin or systemic complaints except as noted in HPI or Assessment and Plan.  Objective  Well appearing patient in no apparent distress; mood and affect are within normal limits.  A focused examination was performed including face, scalp, arms and hands. Relevant physical exam findings are noted in the Assessment and Plan.  Left helix x 1, L nasal sidewall x 1 (2) Erythematous thin papules/macules with gritty scale.     Assessment & Plan  AK (actinic keratosis) (2) Left helix x 1, L nasal sidewall x 1  Actinic keratoses are precancerous spots that appear secondary to cumulative UV radiation exposure/sun exposure over time. They are chronic with expected duration over 1 year. A portion of actinic keratoses will progress to squamous cell carcinoma of the skin. It is not possible to reliably predict which spots will progress to skin cancer and so treatment is recommended to prevent development of skin cancer.  Recommend daily broad spectrum sunscreen SPF 30+ to sun-exposed areas, reapply every 2 hours as needed.  Recommend staying in the shade or wearing long sleeves, sun glasses (UVA+UVB protection) and wide brim hats (4-inch brim around the entire circumference of the hat). Call for new or changing lesions.  Prior to procedure, discussed risks of blister formation, small wound, skin dyspigmentation, or rare scar following cryotherapy. Recommend Vaseline ointment to treated areas while  healing.  Consider bx at left helix if clearing  Destruction of lesion - Left helix x 1, L nasal sidewall x 1  Destruction method: cryotherapy   Informed consent: discussed and consent obtained   Lesion destroyed using liquid nitrogen: Yes   Cryotherapy cycles:  2 Outcome: patient tolerated procedure well with no complications   Post-procedure details: wound care instructions given   Additional details:  Prior to procedure, discussed risks of blister formation, small wound, skin dyspigmentation, or rare scar following cryotherapy. Recommend Vaseline ointment to treated areas while healing.   History of Basal Cell Carcinoma of the Skin - No evidence of recurrence today - Recommend regular full body skin exams - Recommend daily broad spectrum sunscreen SPF 30+ to sun-exposed areas, reapply every 2 hours as needed.  - Call if any new or changing lesions are noted between office visits  Actinic Damage with PreCancerous Actinic Keratoses Counseling for Topical Chemotherapy Management: Patient exhibits: - Severe, confluent actinic changes with pre-cancerous actinic keratoses that is secondary to cumulative UV radiation exposure over time - Condition that is severe; chronic, not at goal. - diffuse scaly erythematous macules and papules with underlying dyspigmentation - Discussed Prescription "Field Treatment" topical Chemotherapy for Severe, Chronic Confluent Actinic Changes with Pre-Cancerous Actinic Keratoses Field treatment involves treatment of an entire area of skin that has confluent Actinic Changes (Sun/ Ultraviolet light damage) and PreCancerous Actinic Keratoses by method of PhotoDynamic Therapy (PDT) and/or prescription Topical Chemotherapy agents such as 5-fluorouracil, 5-fluorouracil/calcipotriene, and/or imiquimod.  The  purpose is to decrease the number of clinically evident and subclinical PreCancerous lesions to prevent progression to development of skin cancer by chemically  destroying early precancer changes that may or may not be visible.  It has been shown to reduce the risk of developing skin cancer in the treated area. As a result of treatment, redness, scaling, crusting, and open sores may occur during treatment course. One or more than one of these methods may be used and may have to be used several times to control, suppress and eliminate the PreCancerous changes. Discussed treatment course, expected reaction, and possible side effects. - Recommend daily broad spectrum sunscreen SPF 30+ to sun-exposed areas, reapply every 2 hours as needed.  - Staying in the shade or wearing long sleeves, sun glasses (UVA+UVB protection) and wide brim hats (4-inch brim around the entire circumference of the hat) are also recommended. - Call for new or changing lesions. - Plan red light PDT with debriding to scalp and tops of ears. Pt has additional questions about it today. Questions answered today. Will schedule when healed from eyelid infection.  Return in about 6 months (around 01/11/2023) for TBSE, Hx BCC, schedule PDT/Red light to scalp and top of ears.  [name], RMA, am acting as scribe for Forest Gleason, MD .  Documentation: I have reviewed the above documentation for accuracy and completeness, and I agree with the above.  Forest Gleason, MD

## 2022-07-13 NOTE — Patient Instructions (Addendum)
Cryotherapy Aftercare  Wash gently with soap and water everyday.   Apply Vaseline and Band-Aid daily until healed.     Due to recent changes in healthcare laws, you may see results of your pathology and/or laboratory studies on MyChart before the doctors have had a chance to review them. We understand that in some cases there may be results that are confusing or concerning to you. Please understand that not all results are received at the same time and often the doctors may need to interpret multiple results in order to provide you with the best plan of care or course of treatment. Therefore, we ask that you please give us 2 business days to thoroughly review all your results before contacting the office for clarification. Should we see a critical lab result, you will be contacted sooner.   If You Need Anything After Your Visit  If you have any questions or concerns for your doctor, please call our main line at [phone] and press option 4 to reach your doctor's medical assistant. If no one answers, please leave a voicemail as directed and we will return your call as soon as possible. Messages left after 4 pm will be answered the following business day.   You may also send us a message via MyChart. We typically respond to MyChart messages within 1-2 business days.  For prescription refills, please ask your pharmacy to contact our office. Our fax number is [phone].  If you have an urgent issue when the clinic is closed that cannot wait until the next business day, you can page your doctor at the number below.    Please note that while we do our best to be available for urgent issues outside of office hours, we are not available 24/7.   If you have an urgent issue and are unable to reach us, you may choose to seek medical care at your doctor's office, retail clinic, urgent care center, or emergency room.  If you have a medical emergency, please immediately call 911 or go to the  emergency department.  Pager Numbers  - Dr. Kowalski: [phone]  - Dr. Moye: [phone]  - Dr. Stewart: [phone]  In the event of inclement weather, please call our main line at [phone] for an update on the status of any delays or closures.  Dermatology Medication Tips: Please keep the boxes that topical medications come in in order to help keep track of the instructions about where and how to use these. Pharmacies typically print the medication instructions only on the boxes and not directly on the medication tubes.   If your medication is too expensive, please contact our office at [phone] option 4 or send us a message through MyChart.   We are unable to tell what your co-pay for medications will be in advance as this is different depending on your insurance coverage. However, we may be able to find a substitute medication at lower cost or fill out paperwork to get insurance to cover a needed medication.   If a prior authorization is required to get your medication covered by your insurance company, please allow us 1-2 business days to complete this process.  Drug prices often vary depending on where the prescription is filled and some pharmacies may offer cheaper prices.  The website www.goodrx.com contains coupons for medications through different pharmacies. The prices here do not account for what the cost may be with help from insurance (it may be cheaper with your insurance), but the website can   give you the price if you did not use any insurance.  - You can print the associated coupon and take it with your prescription to the pharmacy.  - You may also stop by our office during regular business hours and pick up a GoodRx coupon card.  - If you need your prescription sent electronically to a different pharmacy, notify our office through [facility] MyChart or by phone at [phone] option 4.     Si Usted Necesita Algo Despus de Su Visita  Tambin puede  enviarnos un mensaje a travs de MyChart. Por lo general respondemos a los mensajes de MyChart en el transcurso de 1 a 2 das hbiles.  Para renovar recetas, por favor pida a su farmacia que se ponga en contacto con nuestra oficina. Nuestro nmero de fax es el [phone].  Si tiene un asunto urgente cuando la clnica est cerrada y que no puede esperar hasta el siguiente da hbil, puede llamar/localizar a su doctor(a) al nmero que aparece a continuacin.   Por favor, tenga en cuenta que aunque hacemos todo lo posible para estar disponibles para asuntos urgentes fuera del horario de oficina, no estamos disponibles las 24 horas del da, los 7 das de la semana.   Si tiene un problema urgente y no puede comunicarse con nosotros, puede optar por buscar atencin mdica  en el consultorio de su doctor(a), en una clnica privada, en un centro de atencin urgente o en una sala de emergencias.  Si tiene una emergencia mdica, por favor llame inmediatamente al 911 o vaya a la sala de emergencias.  Nmeros de bper  - Dr. Kowalski: [phone]  - Dra. Moye: [phone]  - Dra. Stewart: [phone]  En caso de inclemencias del tiempo, por favor llame a nuestra lnea principal al [phone] para una actualizacin sobre el estado de cualquier retraso o cierre.  Consejos para la medicacin en dermatologa: Por favor, guarde las cajas en las que vienen los medicamentos de uso tpico para ayudarle a seguir las instrucciones sobre dnde y cmo usarlos. Las farmacias generalmente imprimen las instrucciones del medicamento slo en las cajas y no directamente en los tubos del medicamento.   Si su medicamento es muy caro, por favor, pngase en contacto con nuestra oficina llamando al [phone] y presione la opcin 4 o envenos un mensaje a travs de MyChart.   No podemos decirle cul ser su copago por los medicamentos por adelantado ya que esto es diferente dependiendo de la cobertura de su seguro.  Sin embargo, es posible que podamos encontrar un medicamento sustituto a menor costo o llenar un formulario para que el seguro cubra el medicamento que se considera necesario.   Si se requiere una autorizacin previa para que su compaa de seguros cubra su medicamento, por favor permtanos de 1 a 2 das hbiles para completar este proceso.  Los precios de los medicamentos varan con frecuencia dependiendo del lugar de dnde se surte la receta y alguna farmacias pueden ofrecer precios ms baratos.  El sitio web www.goodrx.com tiene cupones para medicamentos de diferentes farmacias. Los precios aqu no tienen en cuenta lo que podra costar con la ayuda del seguro (puede ser ms barato con su seguro), pero el sitio web puede darle el precio si no utiliz ningn seguro.  - Puede imprimir el cupn correspondiente y llevarlo con su receta a la farmacia.  - Tambin puede pasar por nuestra oficina durante el horario de atencin regular y recoger una tarjeta de cupones de GoodRx.  -   Si necesita que su receta se enve electrnicamente a una farmacia diferente, informe a nuestra oficina a travs de MyChart de [facility] o por telfono llamando al [phone] y presione la opcin 4.  

## 2022-07-18 ENCOUNTER — Encounter: Payer: Self-pay | Admitting: Dermatology

## 2022-07-18 DIAGNOSIS — H0012 Chalazion right lower eyelid: Secondary | ICD-10-CM | POA: Diagnosis not present

## 2022-07-18 DIAGNOSIS — H0015 Chalazion left lower eyelid: Secondary | ICD-10-CM | POA: Diagnosis not present

## 2022-07-19 ENCOUNTER — Telehealth: Payer: Self-pay | Admitting: Family Medicine

## 2022-07-19 NOTE — Telephone Encounter (Signed)
Per [facility] patient got tetanus shot done today. I have entered in EMR.

## 2022-07-19 NOTE — Telephone Encounter (Signed)
Patient called to state he had his tetanus shot done at [facility] and wanted to let the Dr. Damita Dunnings know.

## 2022-07-20 ENCOUNTER — Ambulatory Visit: Payer: Medicare Other | Attending: Cardiovascular Disease | Admitting: Cardiovascular Disease

## 2022-07-20 ENCOUNTER — Encounter: Payer: Self-pay | Admitting: Cardiovascular Disease

## 2022-07-20 VITALS — BP 130/80 | HR 64 | Ht 71.0 in | Wt 233.2 lb

## 2022-07-20 DIAGNOSIS — I10 Essential (primary) hypertension: Secondary | ICD-10-CM | POA: Diagnosis not present

## 2022-07-20 DIAGNOSIS — E785 Hyperlipidemia, unspecified: Secondary | ICD-10-CM | POA: Diagnosis not present

## 2022-07-20 DIAGNOSIS — I2510 Atherosclerotic heart disease of native coronary artery without angina pectoris: Secondary | ICD-10-CM

## 2022-07-20 MED ORDER — ROSUVASTATIN CALCIUM 20 MG PO TABS: 20.0000 mg | ORAL_TABLET | Freq: Every day | ORAL | 3 refills | Status: DC
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
## 2022-07-20 NOTE — Patient Instructions (Signed)
Medication Instructions:  STOP the Atorvastatin  START Rosuvastatin 20 mg once daily   *If you need a refill on your cardiac medications before your next appointment, please call your pharmacy*   Lab Work: Your provider would like for you to return in 2 months to have the following labs drawn: Fasting lipid and liver.   Please go to the [facility] entrance and check in at the front desk.  You do not need an appointment.  They are open from 7am-6 pm.  You will need to be fasting.  If you have labs (blood work) drawn today and your tests are completely normal, you will receive your results only by: [place] (if you have MyChart) OR A paper copy in the mail If you have any lab test that is abnormal or we need to change your treatment, we will call you to review the results.   Testing/Procedures: None ordered   Follow-Up: At [facility], you and your health needs are our priority.  As part of our continuing mission to provide you with exceptional heart care, we have created designated Provider Care Teams.  These Care Teams include your primary Cardiologist (physician) and Advanced Practice Providers (APPs -  Physician Assistants and Nurse Practitioners) who all work together to provide you with the care you need, when you need it.  We recommend signing up for the patient portal called "MyChart".  Sign up information is provided on this After Visit Summary.  MyChart is used to connect with patients for Virtual Visits (Telemedicine).  Patients are able to view lab/test results, encounter notes, upcoming appointments, etc.  Non-urgent messages can be sent to your provider as well.   To learn more about what you can do with MyChart, go to [email or web address].    Your next appointment:   6 month(s)  Provider:   You may see Kathlyn Sacramento, MD or one of the following Advanced Practice Providers on your designated Care Team:   Murray Hodgkins, NP Christell Faith,  PA-C Cadence Kathlen Mody, PA-C Gerrie Nordmann, NP

## 2022-07-31 DIAGNOSIS — H0012 Chalazion right lower eyelid: Secondary | ICD-10-CM | POA: Diagnosis not present

## 2022-08-21 ENCOUNTER — Ambulatory Visit: Payer: Medicare Other

## 2022-08-22 ENCOUNTER — Telehealth: Payer: Self-pay

## 2022-08-22 ENCOUNTER — Ambulatory Visit: Payer: Medicare Other | Admitting: Dermatology

## 2022-08-22 DIAGNOSIS — L570 Actinic keratosis: Secondary | ICD-10-CM

## 2022-08-22 MED ORDER — AMINOLEVULINIC ACID HCL 10 % EX GEL
2000.0000 mg | Freq: Once | CUTANEOUS | Status: AC
  Administered 2022-08-22: 2000 mg via TOPICAL

## 2022-08-22 NOTE — Telephone Encounter (Signed)
We can bring him in to do face with PDT if he is open to it and just avoid applying medication anywhere that looks inflamed near the eye if he would like to. Otherwise, I can recheck his scalp response in around 3 weeks and discuss options for face and ears with him then since his eye is not clear. Thank you.
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
## 2022-08-22 NOTE — Patient Instructions (Signed)

## 2022-08-22 NOTE — Progress Notes (Signed)
Patient completed red light photo therapy with debridement today  1. AK (actinic keratosis) Scalp  Photodynamic therapy - Scalp Procedure discussed: discussed risks, benefits, side effects. and alternatives   Prep: site scrubbed/prepped with acetone   Debridement needed: Yes   Location:  Scalp Number of lesions:  Multiple Type of treatment:  Red light Aminolevulinic Acid (see MAR for details): Ameluz Amount of Ameluz (mg):  1 Incubation time (minutes):  120 Number of minutes under lamp:  10 Number of seconds under lamp:  0 Cooling:  Floor fan Outcome: patient tolerated procedure well with no complications   Post-procedure details: sunscreen applied    Related Medications Aminolevulinic Acid HCl 10 % GEL 2,000 mg   Johnsie Kindred, RMA  I personally debrided area prior to application of aminolevulinic acid  Brendolyn Patty  Documentation: I have reviewed the above documentation for accuracy and completeness, and I agree with the above.  Brendolyn Patty MD

## 2022-08-23 NOTE — Telephone Encounter (Signed)
Patient has scheduled red light for 09/21/22. aw

## 2022-08-27 IMAGING — DX DG CHEST 2V
2 series · 2 of 2 positions shown · non-contrast
Comparison: 04/17/2014.

CLINICAL DATA: Productive cough for 1 week.

EXAM:
CHEST - 2 VIEW

[chest pa]
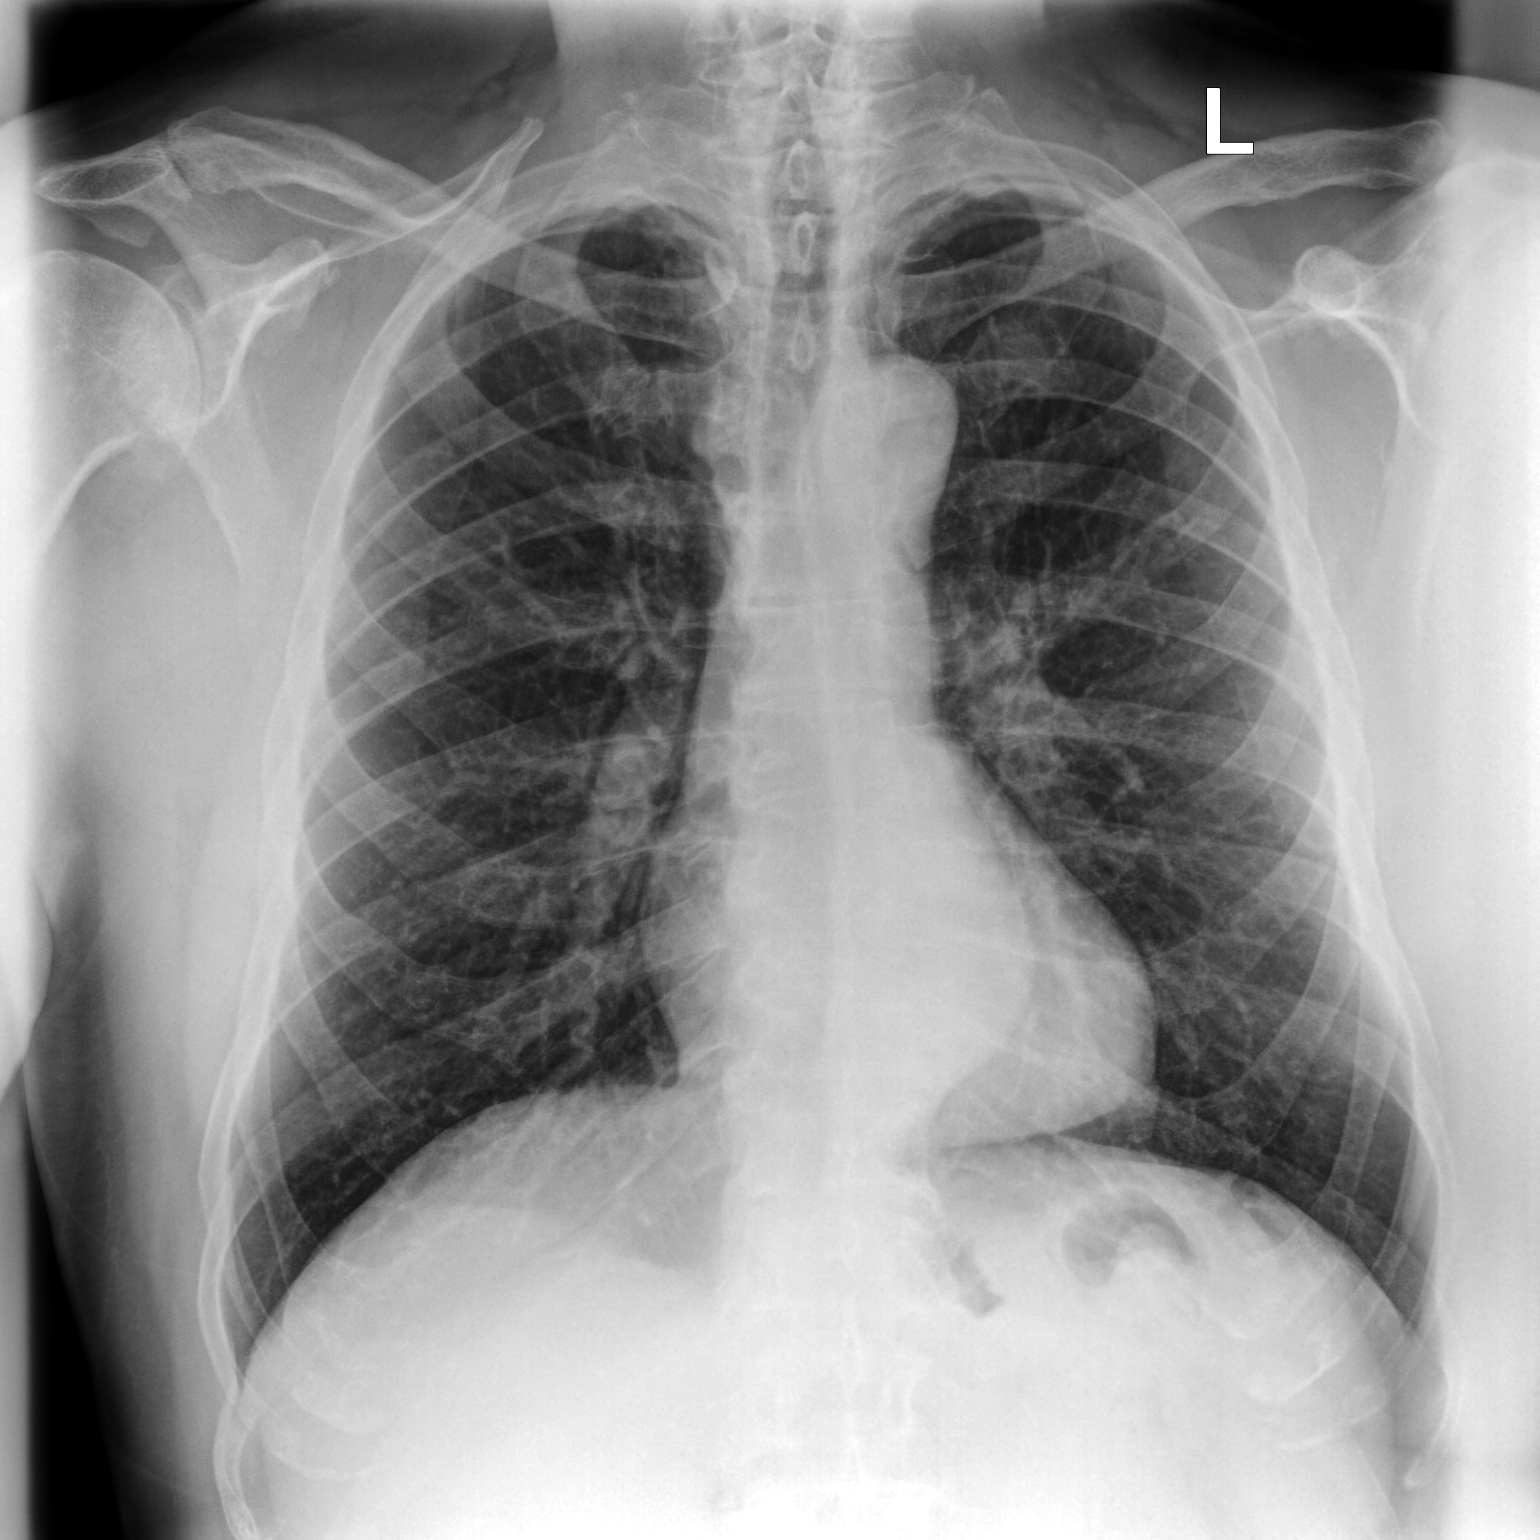

[chest lat]
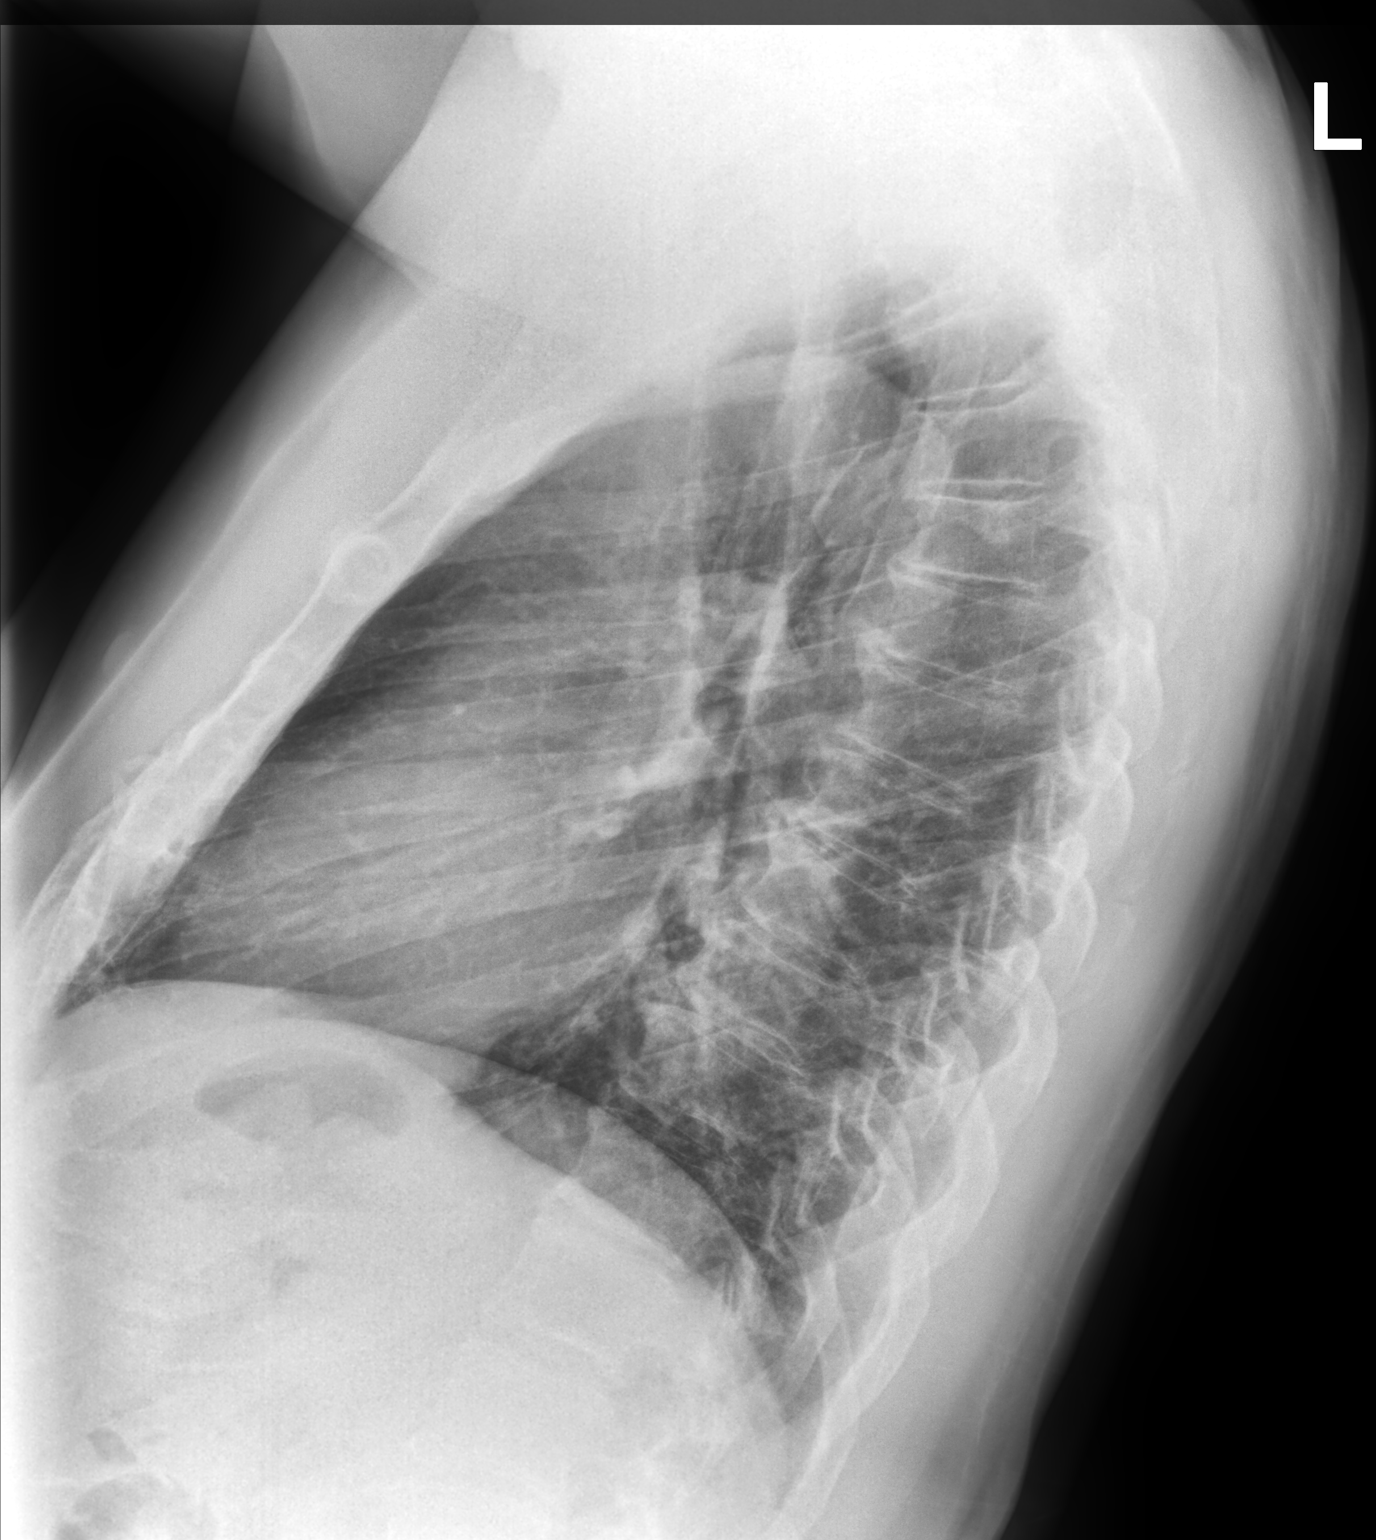

[2 of 2 positions shown; findings below may reference images not displayed]

FINDINGS: Cardiac silhouette is normal in size and configuration. No
mediastinal or hilar masses. No evidence of adenopathy.

Clear lungs.  No pleural effusion or pneumothorax.

Skeletal structures are intact.
IMPRESSION: No active cardiopulmonary disease.

## 2022-09-18 ENCOUNTER — Other Ambulatory Visit
Admission: RE | Admit: 2022-09-18 | Discharge: 2022-09-18 | Disposition: A | Discharge status: Home / Self Care | Payer: Medicare Other | Attending: Cardiovascular Disease | Admitting: Cardiovascular Disease

## 2022-09-18 DIAGNOSIS — I10 Essential (primary) hypertension: Secondary | ICD-10-CM | POA: Diagnosis not present

## 2022-09-18 DIAGNOSIS — E785 Hyperlipidemia, unspecified: Secondary | ICD-10-CM | POA: Diagnosis not present

## 2022-09-18 LAB — LIPID PANEL
Cholesterol: 120 mg/dL (ref 0–200)
HDL: 30 mg/dL — ABNORMAL LOW (ref >40)
LDL Cholesterol: 61 mg/dL (ref 0–99)
Total CHOL/HDL Ratio: 4 RATIO
Triglycerides: 143 mg/dL (ref <150)
VLDL: 29 mg/dL (ref 0–40)

## 2022-09-18 LAB — HEPATIC FUNCTION PANEL
ALT: 33 U/L (ref 0–44)
AST: 31 U/L (ref 15–41)
Albumin: 4 g/dL (ref 3.5–5.0)
Alkaline Phosphatase: 63 U/L (ref 38–126)
Bilirubin, Direct: 0.2 mg/dL (ref 0.0–0.2)
Indirect Bilirubin: 1.4 mg/dL — ABNORMAL HIGH (ref 0.3–0.9)
Total Bilirubin: 1.6 mg/dL — ABNORMAL HIGH (ref 0.3–1.2)
Total Protein: 7 g/dL (ref 6.5–8.1)

## 2022-09-19 ENCOUNTER — Telehealth: Payer: Self-pay | Admitting: Cardiovascular Disease

## 2022-09-19 NOTE — Telephone Encounter (Signed)
Pt is returning Pamela's call regarding lab results. Please call back.

## 2022-09-19 NOTE — Telephone Encounter (Signed)
Spoke with patient and reviewed that provider has not yet reviewed and released review of his results. Advised that once they are done we will send results through My Chart but he really prefers for [place] to call him for those refills.

## 2022-09-19 NOTE — Telephone Encounter (Signed)
Patient is requesting call back to discuss results of labs.

## 2022-09-19 NOTE — Telephone Encounter (Signed)
Left voicemail message to call back  

## 2022-09-21 ENCOUNTER — Ambulatory Visit: Payer: Medicare Other | Admitting: Dermatology

## 2022-09-21 DIAGNOSIS — L570 Actinic keratosis: Secondary | ICD-10-CM

## 2022-09-21 MED ORDER — AMINOLEVULINIC ACID HCL 10 % EX GEL
2000.0000 mg | Freq: Once | CUTANEOUS | Status: AC
Start: 2022-09-21 — End: 2022-09-21
  Administered 2022-09-21: 2000 mg via TOPICAL

## 2022-09-21 NOTE — Patient Instructions (Signed)

## 2022-09-21 NOTE — Progress Notes (Signed)
Patient completed red light phototherapy today.  1. AK (actinic keratosis) Head - Anterior (Face)  Photodynamic therapy - Head - Anterior (Face) Procedure discussed: discussed risks, benefits, side effects. and alternatives   Prep: site scrubbed/prepped with acetone   Debridement needed: Yes (performed by Physician with sand paper.  [phone]))   Location:  Face Number of lesions:  Multiple >15 Type of treatment:  Red light Aminolevulinic Acid (see MAR for details): Ameluz Aminolevulinic Acid comment:  [id number] Amount of Ameluz (mg):  1 Incubation time (minutes):  60 Number of minutes under lamp:  20 Cooling:  Fan Outcome: patient tolerated procedure well with no complications   Post-procedure details: sunscreen applied and aftercare instructions given to patient    Related Medications Aminolevulinic Acid HCl 10 % GEL 2,000 mg   Dorathy Daft, RMA  I personally debrided area prior to application of aminolevulinic acid  Documentation: I have reviewed the above documentation for accuracy and completeness, and I agree with the above.  Darden Dates, MD

## 2022-09-21 NOTE — Telephone Encounter (Signed)
Patient calling back. He states he is concerned about his liver functions from his results. He is not sure if his medications need to be changed.

## 2022-09-21 NOTE — Telephone Encounter (Signed)
Patient wants a call back to discuss his test results.

## 2022-09-22 NOTE — Telephone Encounter (Signed)
Spoke with patient and relayed to him the recommendations of the provider as follows:  The elevated bilirubin is very minimal and not clinically significant.  His cholesterol looks much better than before.  He should continue same medications.  Patient was satisfied with the results and response.

## 2022-09-22 NOTE — Telephone Encounter (Signed)
The elevated bilirubin is very minimal and not clinically significant.  His cholesterol looks much better than before.  He should continue same medications.

## 2022-10-01 ENCOUNTER — Other Ambulatory Visit: Payer: Self-pay | Admitting: Family Medicine

## 2022-10-28 ENCOUNTER — Other Ambulatory Visit: Payer: Self-pay | Admitting: Cardiovascular Disease

## 2022-10-28 DIAGNOSIS — I10 Essential (primary) hypertension: Secondary | ICD-10-CM

## 2022-11-07 DIAGNOSIS — H0015 Chalazion left lower eyelid: Secondary | ICD-10-CM | POA: Diagnosis not present

## 2022-11-18 ENCOUNTER — Other Ambulatory Visit: Payer: Self-pay | Admitting: Family Medicine

## 2022-12-11 ENCOUNTER — Other Ambulatory Visit: Payer: Self-pay | Admitting: Family Medicine

## 2023-01-02 ENCOUNTER — Other Ambulatory Visit: Payer: Self-pay | Admitting: Nurse Practitioner

## 2023-01-02 ENCOUNTER — Encounter: Payer: Self-pay | Admitting: Family Medicine

## 2023-01-02 ENCOUNTER — Ambulatory Visit: Payer: Medicare Other | Admitting: Family Medicine

## 2023-01-02 VITALS — BP 122/62 | HR 66 | Temp 97.7°F | Ht 71.0 in | Wt 220.0 lb

## 2023-01-02 DIAGNOSIS — R202 Paresthesia of skin: Secondary | ICD-10-CM

## 2023-01-02 LAB — CBC WITH DIFFERENTIAL/PLATELET
Basophils Absolute: 0 10*3/uL (ref 0.0–0.1)
Basophils Relative: 0.9 % (ref 0.0–3.0)
Eosinophils Absolute: 0.1 10*3/uL (ref 0.0–0.7)
Eosinophils Relative: 1.7 % (ref 0.0–5.0)
HCT: 47.4 % (ref 39.0–52.0)
Hemoglobin: 16.3 g/dL (ref 13.0–17.0)
Lymphocytes Relative: 45 % (ref 12.0–46.0)
Lymphs Abs: 2.3 10*3/uL (ref 0.7–4.0)
MCHC: 34.4 g/dL (ref 30.0–36.0)
MCV: 94.9 fl (ref 78.0–100.0)
Monocytes Absolute: 0.7 10*3/uL (ref 0.1–1.0)
Monocytes Relative: 12.9 % — ABNORMAL HIGH (ref 3.0–12.0)
Neutro Abs: 2 10*3/uL (ref 1.4–7.7)
Neutrophils Relative %: 39.5 % — ABNORMAL LOW (ref 43.0–77.0)
Platelets: 157 10*3/uL (ref 150.0–400.0)
RBC: 4.99 Mil/uL (ref 4.22–5.81)
RDW: 12.7 % (ref 11.5–15.5)
WBC: 5.2 10*3/uL (ref 4.0–10.5)

## 2023-01-02 LAB — COMPREHENSIVE METABOLIC PANEL
ALT: 24 U/L (ref 0–53)
AST: 27 U/L (ref 0–37)
Albumin: 4.4 g/dL (ref 3.5–5.2)
Alkaline Phosphatase: 63 U/L (ref 39–117)
BUN: 22 mg/dL (ref 6–23)
CO2: 30 mEq/L (ref 19–32)
Calcium: 10 mg/dL (ref 8.4–10.5)
Chloride: 101 mEq/L (ref 96–112)
Creatinine, Ser: 0.88 mg/dL (ref 0.40–1.50)
GFR: 92.14 mL/min (ref >60.00)
Glucose, Bld: 94 mg/dL (ref 70–99)
Potassium: 4.6 mEq/L (ref 3.5–5.1)
Sodium: 137 mEq/L (ref 135–145)
Total Bilirubin: 1.2 mg/dL (ref 0.2–1.2)
Total Protein: 6.7 g/dL (ref 6.0–8.3)

## 2023-01-02 LAB — TSH: TSH: 0.16 u[IU]/mL — ABNORMAL LOW (ref 0.35–5.50)

## 2023-01-02 LAB — VITAMIN B12: Vitamin B-12: 250 pg/mL (ref 211–911)

## 2023-01-02 MED ORDER — ROSUVASTATIN CALCIUM 20 MG PO TABS: ORAL_TABLET | ORAL | Status: DC

## 2023-01-02 NOTE — Progress Notes (Signed)
Stinging in both feet (heels and toes); worse when up walking but does occur even when laying down. This has been going on about 6-8 weeks. Patient stated that his fingers will even go numb as well. Strength is still normal in the extremities.    Meds, vitals, and allergies reviewed.   ROS: Per HPI unless specifically indicated in ROS section   GEN: nad, alert and oriented HEENT: ncat NECK: supple w/o LA CV: rrr.  PULM: ctab, no inc wob ABD: soft, +bs EXT: no edema SKIN: no acute rash  Normal inspection No skin breakdown No calluses  Normal DP pulses Normal sensation to light touch and monofilament Nails normal  Normal strength and sensation on gross exam for the extremities.  Speech normal.  Gait normal.

## 2023-01-02 NOTE — Patient Instructions (Signed)
Go to the lab on the way out.   If you have mychart we'll likely use that to update you.    Take care.  Glad to see you. Hold crestor for about 2 weeks and see if your symptoms improve.  Either way, let me know.

## 2023-01-03 ENCOUNTER — Other Ambulatory Visit: Payer: Self-pay | Admitting: Family Medicine

## 2023-01-03 DIAGNOSIS — R202 Paresthesia of skin: Secondary | ICD-10-CM | POA: Insufficient documentation

## 2023-01-03 MED ORDER — LEVOTHYROXINE SODIUM 150 MCG PO TABS: ORAL_TABLET | ORAL | Status: DC

## 2023-01-03 MED ORDER — B-12 2000 MCG PO TABS: 1.0000 | ORAL_TABLET | Freq: Every day | ORAL | Status: AC

## 2023-01-03 NOTE — Assessment & Plan Note (Signed)
Normal plantarflexion and dorsiflexion bilaterally.  No skin breakdown.  Unclear source of paresthesias.  Differential discussed with patient.  See notes on labs.  Opted for outpatient follow-up.

## 2023-01-06 ENCOUNTER — Other Ambulatory Visit: Payer: Self-pay | Admitting: Cardiovascular Disease

## 2023-01-06 DIAGNOSIS — I10 Essential (primary) hypertension: Secondary | ICD-10-CM

## 2023-01-08 NOTE — Telephone Encounter (Signed)
Hey, last visit on 07/20/22 was for f/u plan of 6 month(s).  But the recall is for 1 year.  Could you verify if he is to f/u 6 months or 1 year?  If it is for 6 months then his f/u should be next month.

## 2023-01-11 ENCOUNTER — Encounter: Payer: Medicare Other | Admitting: Dermatology

## 2023-01-15 ENCOUNTER — Telehealth: Payer: Self-pay | Admitting: Family Medicine

## 2023-01-15 NOTE — Telephone Encounter (Signed)
Pt called back to let Dr. Para March know since their last visit on 7/16, he's been feeling better. Pt pt states he still has pain, but it isn't severe. Pt stated he feels the ending of his cholesterol meds & decrease in thyroid meds has made a much difference. Call back # [phone]

## 2023-01-16 NOTE — Telephone Encounter (Signed)
Spoke with patient and advised patient about recommendations. Patient verbalized understanding.

## 2023-01-16 NOTE — Telephone Encounter (Addendum)
Duly noted.  I would keep taking B12 as is.  I still want to recheck his labs as planned.  I would stay off crestor for another 2 weeks. If the pain is better, I would try restarting crestor 1/2 tab MWF to see if he can tolerate that.  If more pain with that, then stop again and let me know.  Thanks .

## 2023-02-15 ENCOUNTER — Ambulatory Visit: Payer: Medicare Other | Admitting: Dermatology

## 2023-02-15 ENCOUNTER — Encounter: Payer: Self-pay | Admitting: Dermatology

## 2023-02-15 DIAGNOSIS — L578 Other skin changes due to chronic exposure to nonionizing radiation: Secondary | ICD-10-CM | POA: Diagnosis not present

## 2023-02-15 DIAGNOSIS — D239 Other benign neoplasm of skin, unspecified: Secondary | ICD-10-CM | POA: Diagnosis not present

## 2023-02-15 DIAGNOSIS — L821 Other seborrheic keratosis: Secondary | ICD-10-CM

## 2023-02-15 DIAGNOSIS — Z1283 Encounter for screening for malignant neoplasm of skin: Secondary | ICD-10-CM

## 2023-02-15 DIAGNOSIS — L570 Actinic keratosis: Secondary | ICD-10-CM

## 2023-02-15 DIAGNOSIS — D229 Melanocytic nevi, unspecified: Secondary | ICD-10-CM

## 2023-02-15 DIAGNOSIS — D485 Neoplasm of uncertain behavior of skin: Secondary | ICD-10-CM | POA: Diagnosis not present

## 2023-02-15 DIAGNOSIS — L814 Other melanin hyperpigmentation: Secondary | ICD-10-CM

## 2023-02-15 DIAGNOSIS — D1801 Hemangioma of skin and subcutaneous tissue: Secondary | ICD-10-CM
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
## 2023-02-15 NOTE — Patient Instructions (Addendum)

## 2023-02-27 ENCOUNTER — Ambulatory Visit: Payer: Medicare Other | Admitting: Family Medicine

## 2023-02-27 ENCOUNTER — Ambulatory Visit (INDEPENDENT_AMBULATORY_CARE_PROVIDER_SITE_OTHER): Payer: Medicare Other | Admitting: Family Medicine

## 2023-02-27 ENCOUNTER — Other Ambulatory Visit: Payer: Medicare Other

## 2023-02-27 ENCOUNTER — Other Ambulatory Visit (INDEPENDENT_AMBULATORY_CARE_PROVIDER_SITE_OTHER): Payer: Medicare Other

## 2023-02-27 ENCOUNTER — Encounter: Payer: Self-pay | Admitting: Family Medicine

## 2023-02-27 VITALS — BP 120/78 | HR 65 | Temp 97.7°F | Ht 71.0 in | Wt 217.0 lb

## 2023-02-27 DIAGNOSIS — M109 Gout, unspecified: Secondary | ICD-10-CM | POA: Diagnosis not present

## 2023-02-27 DIAGNOSIS — R202 Paresthesia of skin: Secondary | ICD-10-CM

## 2023-02-27 LAB — URIC ACID: Uric Acid, Serum: 7.2 mg/dL (ref 4.0–7.8)

## 2023-02-27 LAB — TSH: TSH: 1.59 u[IU]/mL (ref 0.35–5.50)

## 2023-02-27 LAB — VITAMIN B12: Vitamin B-12: 1423 pg/mL — ABNORMAL HIGH (ref 211–911)

## 2023-02-27 MED ORDER — COLCHICINE 0.6 MG PO TABS: 0.6000 mg | ORAL_TABLET | Freq: Every day | ORAL | 1 refills | Status: DC | PRN

## 2023-02-27 MED ORDER — PANTOPRAZOLE SODIUM 40 MG PO TBEC: DELAYED_RELEASE_TABLET | ORAL | Status: DC

## 2023-02-27 MED ORDER — PREDNISONE 20 MG PO TABS: ORAL_TABLET | ORAL | 1 refills | Status: DC

## 2023-02-27 NOTE — Patient Instructions (Signed)
Prednisone with food.  Don't take ibuprofen or aleve.  Take colchicine if needed.  Take care.  Glad to see you.

## 2023-02-27 NOTE — Progress Notes (Unsigned)
Swelling and pain in right big toe since Friday evening. Patient states toe is very sore and red. Has been taking tylenol and using an otc cream.  Pain walking.  Uric acid pending.  4th episode lifetime.  On chlorthalidone.    B12 and TSH pending.  See notes on labs.    Tingling and pain in foot improved off statin in the meantime.  That appears to be a separate issue from gout.   Meds, vitals, and allergies reviewed.   ROS: Per HPI unless specifically indicated in ROS section   Nad Ncat R foot with redness swelling and tenderness at R 1st MTP.

## 2023-02-28 DIAGNOSIS — M109 Gout, unspecified: Secondary | ICD-10-CM | POA: Insufficient documentation

## 2023-02-28 NOTE — Assessment & Plan Note (Signed)
Prednisone with food.  Don't take ibuprofen or aleve.  Take colchicine if needed.  May need to change chlorthalidone See notes on labs.

## 2023-02-28 NOTE — Assessment & Plan Note (Signed)
B12 and TSH pending.  See notes on labs.   Tingling and pain in foot improved off statin in the meantime.  Unclear if that is from B12 replacement, statin cessation, or both.  D/w pt.

## 2023-03-07 ENCOUNTER — Other Ambulatory Visit: Payer: Self-pay | Admitting: Nurse Practitioner

## 2023-03-07 NOTE — Telephone Encounter (Signed)
Good Morning,   Could you please schedule this patient a 6 month follow up visit? The patient was last seen by Dr. Kirke Corin on 07-20-2022. Thank you so much.

## 2023-03-21 ENCOUNTER — Other Ambulatory Visit: Payer: Self-pay | Admitting: Family Medicine

## 2023-03-25 ENCOUNTER — Other Ambulatory Visit: Payer: Self-pay | Admitting: Family Medicine

## 2023-03-25 DIAGNOSIS — E039 Hypothyroidism, unspecified: Secondary | ICD-10-CM

## 2023-04-03 ENCOUNTER — Telehealth: Payer: Self-pay | Admitting: Family Medicine

## 2023-04-03 DIAGNOSIS — E039 Hypothyroidism, unspecified: Secondary | ICD-10-CM

## 2023-04-03 DIAGNOSIS — M109 Gout, unspecified: Secondary | ICD-10-CM

## 2023-04-03 MED ORDER — ALLOPURINOL 100 MG PO TABS: 100.0000 mg | ORAL_TABLET | Freq: Every day | ORAL | 3 refills | Status: DC

## 2023-04-03 NOTE — Telephone Encounter (Signed)
Patient called in stating that sine he has been having a lot of gout flare ups,his friend is taking allopurinol for her gout and it seems to be helping her.He would like to know could he be prescribed this medication to see if it helps him?'   [id number] - [place], [place] - [id number] Phone: [phone]  Fax: [phone]

## 2023-04-03 NOTE — Telephone Encounter (Signed)
Called patient states that he had improvement from gout symptoms. He went on church trip an had a lot of pain. He states he started the prednisone on Monday. He has not had any improvement in symptoms. He is having pain in left >right foot. Has redness and swelling as well as tenderness. Wanted to know if he could start a preventative or if you need to see him in office.

## 2023-04-03 NOTE — Telephone Encounter (Signed)
I wouldn't start allopurinol in the middle of a flare- it can make the flare worse.  I would take colchicine with prednisone.  If that isn't helping, then needs to be rechecked in person.    Once this flare has resolved, he can start taking allopurinol 100mg  a day.  Would need recheck labs about 1-2 months after starting allopurinol, nonfasting lab visit.  Thanks.

## 2023-04-05 NOTE — Telephone Encounter (Signed)
Patient feels like symptoms have increased some. I have set up for evaluation tomorrow with Jae Dire. I have reviewed all information with patient and aware that after flair up he will start allopurinol. Will call at that time to set up lab appointment  1-2 months.

## 2023-04-05 NOTE — Telephone Encounter (Signed)
Noted. Thanks.

## 2023-04-06 ENCOUNTER — Ambulatory Visit (INDEPENDENT_AMBULATORY_CARE_PROVIDER_SITE_OTHER): Payer: Medicare Other | Admitting: Primary Care

## 2023-04-06 ENCOUNTER — Encounter: Payer: Self-pay | Admitting: Primary Care

## 2023-04-06 VITALS — BP 142/76 | HR 78 | Temp 97.8°F | Ht 71.0 in | Wt 229.0 lb

## 2023-04-06 DIAGNOSIS — M109 Gout, unspecified: Secondary | ICD-10-CM

## 2023-04-06 MED ORDER — INDOMETHACIN 50 MG PO CAPS
50.0000 mg | ORAL_CAPSULE | Freq: Three times a day (TID) | ORAL | 0 refills | Status: AC
Start: 2023-04-06 — End: ?

## 2023-04-06 NOTE — Patient Instructions (Signed)
You may take indomethacin 3 times daily with food as needed for gout pain.  Stop taking colchicine medicine. Stop taking allopurinol medication.  Resume allopurinol for gout prevention once your gout flare has completely resolved.  It was a pleasure meeting you!

## 2023-04-06 NOTE — Assessment & Plan Note (Signed)
Continue to flare without complications.  Stop colchicine as this is ineffective. Stop allopurinol.  Discussed to start allopurinol once flare has completely resolved.  Start indomethacin 50 mg 3 times daily with meals as needed. Discussed to avoid other NSAID medications such as Advil/naproxen, etc.  Follow-up with PCP if no improvement.
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
## 2023-04-13 ENCOUNTER — Other Ambulatory Visit: Payer: Self-pay | Admitting: Cardiovascular Disease

## 2023-04-13 ENCOUNTER — Encounter: Payer: Self-pay | Admitting: Family Medicine

## 2023-04-13 ENCOUNTER — Ambulatory Visit (INDEPENDENT_AMBULATORY_CARE_PROVIDER_SITE_OTHER): Payer: Medicare Other | Admitting: Family Medicine

## 2023-04-13 VITALS — BP 146/78 | HR 70 | Temp 98.5°F | Ht 71.0 in | Wt 229.1 lb

## 2023-04-13 DIAGNOSIS — R0989 Other specified symptoms and signs involving the circulatory and respiratory systems: Secondary | ICD-10-CM

## 2023-04-13 DIAGNOSIS — I10 Essential (primary) hypertension: Secondary | ICD-10-CM

## 2023-04-13 DIAGNOSIS — M109 Gout, unspecified: Secondary | ICD-10-CM | POA: Diagnosis not present

## 2023-04-13 DIAGNOSIS — J0100 Acute maxillary sinusitis, unspecified: Secondary | ICD-10-CM | POA: Diagnosis not present

## 2023-04-13 DIAGNOSIS — E785 Hyperlipidemia, unspecified: Secondary | ICD-10-CM

## 2023-04-13 LAB — POC COVID19 BINAXNOW: SARS Coronavirus 2 Ag: NEGATIVE

## 2023-04-13 LAB — POCT INFLUENZA A/B
Influenza A, POC: NEGATIVE
Influenza B, POC: NEGATIVE

## 2023-04-13 MED ORDER — AMOXICILLIN-POT CLAVULANATE 875-125 MG PO TABS: 1.0000 | ORAL_TABLET | Freq: Two times a day (BID) | ORAL | 0 refills | Status: DC

## 2023-04-13 NOTE — Progress Notes (Unsigned)
R 1st MTP pain and redness is better with indomethacin.  He isn't on allopurinol now.  He improved on prednisone prior but it increased his appetite.    URI sx stated about 3 days ago.  Slept in a recliner recently due to chest congestion and cough. Taking mucinex DM in the meantime.  No fevers.  Sputum is green.  No ear pain.  Maxillary pain.  Altered taste in his mouth.  Post nasal gtt.    COVID and strep neg at OV.    Foot pain is better, unclear if from B12 replacement or being off statin.    Meds, vitals, and allergies reviewed.   ROS: Per HPI unless specifically indicated in ROS section   R 1st MTP red.   L max sinus ttp.  Ctab RRR OP WNL  MMM

## 2023-04-13 NOTE — Patient Instructions (Addendum)
Start augmentin.  Rest and fluids in the meantime.   Take care.  Glad to see you.  Don't start allopurinol until the gout flare is clearly gone.   When gout is resolved, I would try slowly restarting rosuvastatin 20mg  1-2 times per week and then gradually increase if tolerated up to daily use.  If you have more trouble then let me know.

## 2023-04-14 DIAGNOSIS — J0100 Acute maxillary sinusitis, unspecified: Secondary | ICD-10-CM | POA: Insufficient documentation

## 2023-04-14 NOTE — Assessment & Plan Note (Signed)
Discussed options.  Would not start allopurinol until the gout flare is clearly gone.

## 2023-04-14 NOTE — Assessment & Plan Note (Signed)
When gout is resolved, I would try slowly restarting rosuvastatin 20mg  1-2 times per week and then gradually increase if tolerated up to daily use.  If having more symptoms then he can let me know.

## 2023-04-14 NOTE — Assessment & Plan Note (Signed)
Start augmentin.  Rest and fluids in the meantime.   Update [place] as needed.  Okay for outpatient follow-up.  He agrees to plan.

## 2023-04-16 NOTE — Telephone Encounter (Signed)
Please contact pt for future appointment. Pt overdue for 6 month f/u.  

## 2023-04-18 ENCOUNTER — Telehealth: Payer: Self-pay | Admitting: Family Medicine

## 2023-04-18 DIAGNOSIS — I10 Essential (primary) hypertension: Secondary | ICD-10-CM

## 2023-04-18 NOTE — Telephone Encounter (Signed)
Prescribed by cardiology

## 2023-04-18 NOTE — Telephone Encounter (Signed)
Prescription Request  04/18/2023  LOV: 04/13/2023  What is the name of the medication or equipment?  chlorthalidone (HYGROTON) 25 MG tablet  Have you contacted your pharmacy to request a refill? Yes   Which pharmacy would you like this sent to?  [facility] [place], [place] - [address] [facility] [address] [place] [address] [place] [address] Phone: [phone] Fax: [phone]   Patient notified that their request is being sent to the clinical staff for review and that they should receive a response within 2 business days.   Please advise at Mobile [phone] (mobile)

## 2023-04-18 NOTE — Telephone Encounter (Signed)
Okay to refill chlorthalidone.

## 2023-04-18 NOTE — Telephone Encounter (Signed)
This has already been refilled

## 2023-05-14 NOTE — Telephone Encounter (Signed)
Patient is scheduled for 07/19/23

## 2023-05-24 ENCOUNTER — Encounter: Payer: Self-pay | Admitting: Primary Care

## 2023-05-24 ENCOUNTER — Ambulatory Visit: Payer: Medicare Other | Admitting: Primary Care

## 2023-05-24 VITALS — BP 134/68 | HR 77 | Temp 97.1°F | Ht 71.0 in | Wt 234.0 lb

## 2023-05-24 DIAGNOSIS — M109 Gout, unspecified: Secondary | ICD-10-CM

## 2023-05-24 DIAGNOSIS — J069 Acute upper respiratory infection, unspecified: Secondary | ICD-10-CM

## 2023-05-24 DIAGNOSIS — E039 Hypothyroidism, unspecified: Secondary | ICD-10-CM

## 2023-05-24 LAB — BASIC METABOLIC PANEL
BUN: 17 mg/dL (ref 6–23)
CO2: 29 meq/L (ref 19–32)
Calcium: 9.4 mg/dL (ref 8.4–10.5)
Chloride: 100 meq/L (ref 96–112)
Creatinine, Ser: 0.76 mg/dL (ref 0.40–1.50)
GFR: 96.04 mL/min (ref >60.00)
Glucose, Bld: 76 mg/dL (ref 70–99)
Potassium: 4.2 meq/L (ref 3.5–5.1)
Sodium: 137 meq/L (ref 135–145)

## 2023-05-24 LAB — POCT INFLUENZA A/B
Influenza A, POC: NEGATIVE
Influenza B, POC: NEGATIVE

## 2023-05-24 LAB — POC COVID19 BINAXNOW: SARS Coronavirus 2 Ag: NEGATIVE

## 2023-05-24 LAB — URIC ACID: Uric Acid, Serum: 7.9 mg/dL — ABNORMAL HIGH (ref 4.0–7.8)

## 2023-05-24 LAB — TSH: TSH: 1.7 u[IU]/mL (ref 0.35–5.50)

## 2023-05-24 MED ORDER — GUAIFENESIN-CODEINE 100-10 MG/5ML PO SOLN: 5.0000 mL | Freq: Three times a day (TID) | ORAL | 0 refills | Status: DC | PRN

## 2023-05-24 NOTE — Addendum Note (Signed)
Addended by: Lonia Blood on: 05/24/2023 10:45 AM   Modules accepted: Orders

## 2023-05-24 NOTE — Assessment & Plan Note (Signed)
HPI and presentation today representative of viral etiology. Respiratory exam and vital signs today reassuring.  Recommended to switch from Claritin to Zyrtec for postnasal drip/allergies. Prescription for guaifenesin with codeine syrup provided per patient request.  Continue pantoprazole. Other conservative recommendations provided.  We discussed to notify us early next week if symptoms have progressed or not have improved.
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
## 2023-05-24 NOTE — Patient Instructions (Signed)
Stop taking Claritin for allergies.  Switch to generic Zyrtec for allergies, runny nose, throat drainage.  You may take the cough suppressant every 8 hours as needed for cough and rest. Caution this medication contains codeine which may cause drowsiness.   Please call us Monday next week if your symptoms have not improved or if your symptoms have progressed.  It was a pleasure meeting you!

## 2023-05-25 ENCOUNTER — Other Ambulatory Visit: Payer: Medicare Other

## 2023-05-27 ENCOUNTER — Telehealth: Payer: Self-pay | Admitting: Family Medicine

## 2023-05-27 NOTE — Telephone Encounter (Signed)
Opened in error

## 2023-05-28 ENCOUNTER — Telehealth: Payer: Self-pay | Admitting: Family Medicine

## 2023-05-28 NOTE — Telephone Encounter (Signed)
Message added under result note.

## 2023-05-28 NOTE — Telephone Encounter (Signed)
Patient called in returning a call he received. Relayed lab results to patient. He stated that the gout cleared up a little bit ago and he started taking the allopurinol (ZYLOPRIM) 100 MG tablet. He stated that he is in good shape right now and will let Dr. Para March know if he has a flare up. Thank you!

## 2023-06-12 ENCOUNTER — Other Ambulatory Visit: Payer: Self-pay | Admitting: Cardiovascular Disease

## 2023-06-12 DIAGNOSIS — I10 Essential (primary) hypertension: Secondary | ICD-10-CM

## 2023-06-21 ENCOUNTER — Telehealth: Payer: Self-pay | Admitting: Family Medicine

## 2023-06-21 ENCOUNTER — Ambulatory Visit: Payer: Self-pay | Admitting: Family Medicine

## 2023-06-21 ENCOUNTER — Telehealth: Payer: Self-pay

## 2023-06-21 DIAGNOSIS — E785 Hyperlipidemia, unspecified: Secondary | ICD-10-CM

## 2023-06-21 DIAGNOSIS — I10 Essential (primary) hypertension: Secondary | ICD-10-CM

## 2023-06-21 DIAGNOSIS — G40909 Epilepsy, unspecified, not intractable, without status epilepticus: Secondary | ICD-10-CM

## 2023-06-21 DIAGNOSIS — R202 Paresthesia of skin: Secondary | ICD-10-CM

## 2023-06-21 MED ORDER — PREDNISONE 20 MG PO TABS: 20.0000 mg | ORAL_TABLET | Freq: Every day | ORAL | 1 refills | Status: DC

## 2023-06-21 MED ORDER — COLCHICINE 0.6 MG PO TABS: 0.6000 mg | ORAL_TABLET | Freq: Every day | ORAL | 1 refills | Status: DC | PRN

## 2023-06-21 NOTE — Telephone Encounter (Addendum)
   Chief Complaint: gout flare up Symptoms: red/purple toe on right foot. Painful to walk Frequency: when walking P Disposition: [] ED /[] Urgent Care (no appt availability in office) / [x] Appointment(In office/virtual)/ []  [facility]/ [] Home Care/ [] Refused Recommended Disposition /[]  Mobile Bus/ []  Follow-up with PCP Additional Notes: Pt has gout and feels he is in beginning stages of flare up. Symptoms stared 1/1/25Right big toe is red/purple and warm to touch.  Pain 2-3/10. Denies all other symptoms. Appt made 1/3 with NP Dugal at 10am. Pt does a medication question on when to start preventative Allopurinol  medication. Pt understood all care advice.         Copied from CRM [id number]. Topic: Clinical - Red Word Triage >> Jun 21, 2023  8:41 AM Gerardine PARAS wrote: Red Word that prompted transfer to Nurse Triage: Patient called in regarding gout flare up, states he was taking prescribed medication allopurinol  (ZYLOPRIM ) 100 MG tablet   which is now not helping reduce symptoms so he did take generic endocine medication that he had previously. Symptoms are reddish/ purple swelling around right foot big toe area, is feverish and hard to walk on. Patient is interested in medical advice Reason for Disposition  [1] Redness of the skin AND [2] no fever  Answer Assessment - Initial Assessment Questions 1. ONSET: When did the pain start?      Been battling for over last year.  2. LOCATION: Where is the pain located?      Right foot- big toe 3. PAIN: How bad is the pain?    (Scale 1-10; or mild, moderate, severe)  - MILD (1-3): doesn't interfere with normal activities.   - MODERATE (4-7): interferes with normal activities (e.g., work or school) or awakens from sleep, limping.   - SEVERE (8-10): excruciating pain, unable to do any normal activities, unable to walk.      2-3 starting stages 4. WORK OR EXERCISE: Has there been any recent work or exercise that involved this  part of the body?      na 5. CAUSE: What do you think is causing the foot pain?     Gout flare-up 6. OTHER SYMPTOMS: Do you have any other symptoms? (e.g., leg pain, rash, fever, numbness)     Denies all  Protocols used: Foot Pain-A-[facility]

## 2023-06-21 NOTE — Telephone Encounter (Signed)
 Called patient verified all information with patient. He would like to know if he needs to continue with Allopurinol as well

## 2023-06-21 NOTE — Telephone Encounter (Signed)
 Please enter lab orders for pt.  He will be here for an acute visit for possible Gout tomorrow with Tabitha.  Would like to have physical labs completed while in office.  Have called pt and asked him to be fasting.   Copied from CRM [id number]. Topic: Clinical - Request for Lab/Test Order >> Jun 21, 2023  9:20 AM Leila C wrote: Reason for CRM: Patient wants to have CPX labs done tomorrow, patient has an appointment tomorrow for Gout. Patient lives far and would appreciate it. Please advise and placed orders [phone].

## 2023-06-21 NOTE — Telephone Encounter (Signed)
 Patient notified

## 2023-06-21 NOTE — Telephone Encounter (Signed)
 I would keep the appointment.   I sent rx for prednisone.  Take with food.  Stop indomethacin when taking prednisone.  Thanks.

## 2023-06-21 NOTE — Telephone Encounter (Signed)
 Spoke with patient he states that he does not think the colchicine  helped at all. He states that the prednisone  worked for the gout even though it  messed up his sleep pattern. He also wants to know does he need to keep his appointment with Tabitha for tomorrow. Please advise.

## 2023-06-21 NOTE — Telephone Encounter (Signed)
 Would continue allopurinol.  Thanks.

## 2023-06-21 NOTE — Telephone Encounter (Signed)
 Done. Thanks.

## 2023-06-21 NOTE — Telephone Encounter (Signed)
 Please let me know about recent colchicine use.  Thanks.

## 2023-06-21 NOTE — Telephone Encounter (Signed)
 Spoke with pt and he states is not taking any colchicine  but is taking allopurinol  100mg  daily. He found a bottle he had of indomethacin  50mg  and took 2 tablets on 06/20/23 and one tablet today. States he is feeling a little better but still feels like he is having a flare up.

## 2023-06-21 NOTE — Telephone Encounter (Signed)
 This is a duplicate patient is being addressed in another telephone encounter

## 2023-06-21 NOTE — Addendum Note (Signed)
 Addended by: [name] on: 06/21/2023 04:23 PM   Modules accepted: Orders

## 2023-06-21 NOTE — Telephone Encounter (Signed)
 I would try colchicine in the meantime.  I sent rx locally.  Thanks.

## 2023-06-21 NOTE — Addendum Note (Signed)
 Addended by: [name] on: 06/21/2023 02:01 PM   Modules accepted: Orders

## 2023-06-21 NOTE — Telephone Encounter (Signed)
 Patient needs prednisone   medication that is the one that helps him he also has some questions regarding his other medication he is wanting to know if he needs to stop the medication he would like call back he does not want to be messaged ON MY CHART HE CAN'T LOG IN

## 2023-06-21 NOTE — Telephone Encounter (Signed)
Noted. Will see pt tomorrow.  

## 2023-06-21 NOTE — Telephone Encounter (Signed)
 Patient called after I spoke with him this is what he stated  Patient needs prednisone   medication that is the one that helps him he also has some questions regarding his other medication he is wanting to know if he needs to stop the medication he would like call back he does not want to be messaged ON MY CHART HE CAN'T LOG IN

## 2023-06-21 NOTE — Telephone Encounter (Signed)
 Can we verify his dosing ?  Is he taking allopurinol daily as well as colchicine every other day or is this incorrect?

## 2023-06-22 ENCOUNTER — Encounter: Payer: Self-pay | Admitting: Family

## 2023-06-22 ENCOUNTER — Ambulatory Visit (INDEPENDENT_AMBULATORY_CARE_PROVIDER_SITE_OTHER): Payer: Medicare Other | Admitting: Family

## 2023-06-22 VITALS — BP 132/78 | HR 62 | Temp 98.3°F | Ht 71.0 in | Wt 234.0 lb

## 2023-06-22 DIAGNOSIS — R202 Paresthesia of skin: Secondary | ICD-10-CM | POA: Diagnosis not present

## 2023-06-22 DIAGNOSIS — I10 Essential (primary) hypertension: Secondary | ICD-10-CM | POA: Diagnosis not present

## 2023-06-22 DIAGNOSIS — M109 Gout, unspecified: Secondary | ICD-10-CM | POA: Diagnosis not present

## 2023-06-22 DIAGNOSIS — G40909 Epilepsy, unspecified, not intractable, without status epilepticus: Secondary | ICD-10-CM | POA: Diagnosis not present

## 2023-06-22 DIAGNOSIS — E785 Hyperlipidemia, unspecified: Secondary | ICD-10-CM | POA: Diagnosis not present

## 2023-06-22 DIAGNOSIS — Z125 Encounter for screening for malignant neoplasm of prostate: Secondary | ICD-10-CM

## 2023-06-22 LAB — HEPATIC FUNCTION PANEL
ALT: 34 U/L (ref 0–53)
AST: 32 U/L (ref 0–37)
Albumin: 4.4 g/dL (ref 3.5–5.2)
Alkaline Phosphatase: 69 U/L (ref 39–117)
Bilirubin, Direct: 0.2 mg/dL (ref 0.0–0.3)
Total Bilirubin: 0.9 mg/dL (ref 0.2–1.2)
Total Protein: 6.9 g/dL (ref 6.0–8.3)

## 2023-06-22 LAB — LIPID PANEL
Cholesterol: 239 mg/dL — ABNORMAL HIGH (ref 0–200)
HDL: 34.6 mg/dL — ABNORMAL LOW (ref >39.00)
LDL Cholesterol: 146 mg/dL — ABNORMAL HIGH (ref 0–99)
NonHDL: 204.44
Total CHOL/HDL Ratio: 7
Triglycerides: 294 mg/dL — ABNORMAL HIGH (ref 0.0–149.0)
VLDL: 58.8 mg/dL — ABNORMAL HIGH (ref 0.0–40.0)

## 2023-06-22 LAB — CBC WITH DIFFERENTIAL/PLATELET
Basophils Absolute: 0.1 10*3/uL (ref 0.0–0.1)
Basophils Relative: 0.9 % (ref 0.0–3.0)
Eosinophils Absolute: 0.1 10*3/uL (ref 0.0–0.7)
Eosinophils Relative: 1.8 % (ref 0.0–5.0)
HCT: 48.7 % (ref 39.0–52.0)
Hemoglobin: 16.7 g/dL (ref 13.0–17.0)
Lymphocytes Relative: 30.6 % (ref 12.0–46.0)
Lymphs Abs: 1.9 10*3/uL (ref 0.7–4.0)
MCHC: 34.3 g/dL (ref 30.0–36.0)
MCV: 98.1 fL (ref 78.0–100.0)
Monocytes Absolute: 1 10*3/uL (ref 0.1–1.0)
Monocytes Relative: 15.3 % — ABNORMAL HIGH (ref 3.0–12.0)
Neutro Abs: 3.2 10*3/uL (ref 1.4–7.7)
Neutrophils Relative %: 51.4 % (ref 43.0–77.0)
Platelets: 162 10*3/uL (ref 150.0–400.0)
RBC: 4.97 Mil/uL (ref 4.22–5.81)
RDW: 13 % (ref 11.5–15.5)
WBC: 6.3 10*3/uL (ref 4.0–10.5)

## 2023-06-22 LAB — VITAMIN B12: Vitamin B-12: 1299 pg/mL — ABNORMAL HIGH (ref 211–911)

## 2023-06-22 LAB — URIC ACID: Uric Acid, Serum: 6.4 mg/dL (ref 4.0–7.8)

## 2023-06-22 LAB — PSA, MEDICARE: PSA: 3.9 ng/mL (ref 0.10–4.00)

## 2023-06-22 MED ORDER — ALLOPURINOL 100 MG PO TABS: 200.0000 mg | ORAL_TABLET | Freq: Every day | ORAL | 0 refills | Status: DC

## 2023-06-22 NOTE — Progress Notes (Signed)
 Established Patient Office Visit  Subjective:   Patient ID: Kevin Charles, male    DOB: 23-Feb-1961  Age: 63 y.o. MRN: [id number]  CC:  Chief Complaint  Patient presents with   Gout    HPI: Kevin Charles is a 63 y.o. male presenting on 06/22/2023 for Gout   Gout, acute flare started about four days ago with redness, tenderness to touch, and painful to walk on right right great toe, [place]. He does report about 4-5 flare ups since September 2024.   Wednesday he took two pills of indocin  and then one yesterday, but without relief. He did take colchicine  in the past and did not give him relief.   He is on allopurinol  100 mg once daily Pcp sent in prednisone  yesterday of which pt has not yet started.   He did forget his depakote  last night so was a bit worried as he is getting his depakote  level checked today. He did take it this morning.   Lab Results  Component Value Date   LABURIC 7.9 (H) 05/24/2023       ROS: Negative unless specifically indicated above in HPI.   Relevant past medical history reviewed and updated as indicated.   Allergies and medications reviewed and updated.   Current Outpatient Medications:    allopurinol  (ZYLOPRIM ) 100 MG tablet, Take 2 tablets (200 mg total) by mouth daily., Disp: 90 tablet, Rfl: 0   aspirin 81 MG tablet, Take 81 mg by mouth daily., Disp: , Rfl:    Benzoyl Peroxide 10 % CREA, Apply to face at night., Disp: , Rfl:    chlorthalidone  (HYGROTON ) 25 MG tablet, TAKE 1 TABLET BY MOUTH EVERY  OTHER DAY, Disp: 45 tablet, Rfl: 0   Cyanocobalamin  (B-12) 2000 MCG TABS, Take 1 tablet by mouth daily., Disp: , Rfl:    divalproex  (DEPAKOTE  ER) 500 MG 24 hr tablet, TAKE 2 TABLETS BY MOUTH TWICE  DAILY EXCEPT SUNDAY AND  WEDNESDAY ONLY TAKE 1 TABLET IN  THE MORNING AND 2 TABLETS IN THE EVENING, Disp: 374 tablet, Rfl: 2   guaiFENesin -codeine  100-10 MG/5ML syrup, Take 5 mLs by mouth 3 (three) times daily as needed for cough., Disp: 120 mL,  Rfl: 0   levothyroxine  (SYNTHROID ) 150 MCG tablet, TAKE 1 TABLET BY MOUTH ONCE DAILY-except take half tablet on Sundays.  6.5 tablets/week in total, Disp: , Rfl:    loratadine (CLARITIN) 10 MG tablet, Take 10 mg by mouth daily as needed for allergies., Disp: , Rfl:    losartan  (COZAAR ) 25 MG tablet, TAKE 1 TABLET BY MOUTH DAILY, Disp: 90 tablet, Rfl: 6   pantoprazole  (PROTONIX ) 40 MG tablet, Taking 1 pill 5 times per week., Disp: , Rfl:    rizatriptan  (MAXALT -MLT) 5 MG disintegrating tablet, TAKE ONE (1) TABLET BY MOUTH AS NEEDED FOR MIGRAINE; MAY REPEAT IN 2 HOURS IF NEEDED, Disp: 4 tablet, Rfl: 12   rosuvastatin  (CRESTOR ) 20 MG tablet, Held as of 01/02/23, Disp: , Rfl:    SAW PALMETTO, SERENOA REPENS, PO, Take 1 or 2 tablets by mouth daily., Disp: , Rfl:    colchicine  0.6 MG tablet, Take 1 tablet (0.6 mg total) by mouth daily as needed (for gout). (Patient not taking: Reported on 06/22/2023), Disp: 30 tablet, Rfl: 1   indomethacin  (INDOCIN ) 50 MG capsule, Take 1 capsule (50 mg total) by mouth 3 (three) times daily with meals. As needed for gout pain (Patient not taking: Reported on 06/22/2023), Disp: 90 capsule, Rfl: 0  predniSONE  (DELTASONE ) 20 MG tablet, Take 1 tablet (20 mg total) by mouth daily with breakfast. (Patient not taking: Reported on 06/22/2023), Disp: 5 tablet, Rfl: 1  Allergies  Allergen Reactions   Flonase [Fluticasone]     nosebleeds   Metoprolol      bradycardia   Pravastatin  Sodium     Aches with pravastatin  but able to tolerate lipitor    Objective:   BP 132/78 (BP Location: Left Arm, Patient Position: Sitting, Cuff Size: Normal)   Pulse 62   Temp 98.3 F (36.8 C) (Temporal)   Ht 5' 11 (1.803 m)   Wt 234 lb (106.1 kg)   SpO2 98%   BMI 32.64 kg/m    Physical Exam Constitutional:      General: He is not in acute distress.    Appearance: Normal appearance. He is normal weight. He is not ill-appearing, toxic-appearing or diaphoretic.  Cardiovascular:     Rate and  Rhythm: Normal rate.  Pulmonary:     Effort: Pulmonary effort is normal.  Musculoskeletal:        General: Normal range of motion.  Feet:     Comments: Right great toe at base with erythema, mild edema, and tenderness to touch. No change in ROM painful ROM slight  Right bunion present  Neurological:     General: No focal deficit present.     Mental Status: He is alert and oriented to person, place, and time. Mental status is at baseline.  Psychiatric:        Mood and Affect: Mood normal.        Behavior: Behavior normal.        Thought Content: Thought content normal.        Judgment: Judgment normal.     Assessment & Plan:  Acute gout of right foot, unspecified cause Assessment & Plan: Treating for acute flare with prednisone  today Continue allopurinol  reviewed last uric acid goal < 5  Increase allopurinol  to 200 mg once daily.  F/u with Dr. Cleatus prn  Handout givne to pt for low purine diet   Orders: -     Uric acid; Future -     Allopurinol ; Take 2 tablets (200 mg total) by mouth daily.  Dispense: 90 tablet; Refill: 0     Follow up plan: Return for f/u PCP if no improvement in symptoms.  Ginger Patrick, FNP

## 2023-06-22 NOTE — Assessment & Plan Note (Signed)
 Treating for acute flare with prednisone today Continue allopurinol reviewed last uric acid goal < 5  Increase allopurinol to 200 mg once daily.  F/u with Dr. Para March prn  Handout givne to pt for low purine diet

## 2023-06-23 LAB — VALPROIC ACID LEVEL: Valproic Acid Lvl: 90.1 mg/L (ref 50.0–100.0)

## 2023-06-24 ENCOUNTER — Encounter: Payer: Self-pay | Admitting: Family

## 2023-06-26 ENCOUNTER — Other Ambulatory Visit: Payer: Medicare Other

## 2023-06-26 ENCOUNTER — Ambulatory Visit: Payer: Medicare Other

## 2023-06-26 VITALS — BP 124/80 | Ht 71.0 in | Wt 233.6 lb

## 2023-06-26 DIAGNOSIS — Z0000 Encounter for general adult medical examination without abnormal findings: Secondary | ICD-10-CM | POA: Diagnosis not present
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
## 2023-06-26 NOTE — Patient Instructions (Addendum)
 Kevin Charles , Thank you for taking time to come for your Medicare Wellness Visit. I appreciate your ongoing commitment to your health goals. Please review the following plan we discussed and let me know if I can assist you in the future.   Referrals/Orders/Follow-Ups/Clinician Recommendations: none  This is a list of the screening recommended for you and due dates:  Health Maintenance  Topic Date Due   Colon Cancer Screening  08/31/2023   Flu Shot  09/17/2023*   COVID-19 Vaccine (4 - 2024-25 season) 03/14/2028*   Medicare Annual Wellness Visit  06/25/2024   DTaP/Tdap/Td vaccine (4 - Td or Tdap) 07/19/2032   Hepatitis C Screening  Completed   HIV Screening  Completed   Zoster (Shingles) Vaccine  Completed   HPV Vaccine  Aged Out  *Topic was postponed. The date shown is not the original due date.    Advanced directives: (Copy Requested) Please bring a copy of your health care power of attorney and living will to the office to be added to your chart at your convenience.  Next Medicare Annual Wellness Visit scheduled for next year: Yes 06/26/2024 @ 9:30am in person

## 2023-07-04 ENCOUNTER — Ambulatory Visit: Payer: Medicare Other | Admitting: Urology

## 2023-07-05 ENCOUNTER — Ambulatory Visit: Payer: Medicare Other | Admitting: Urology

## 2023-07-05 VITALS — BP 128/75 | HR 60 | Ht 71.0 in | Wt 232.0 lb

## 2023-07-05 DIAGNOSIS — R972 Elevated prostate specific antigen [PSA]: Secondary | ICD-10-CM | POA: Diagnosis not present

## 2023-07-05 LAB — BLADDER SCAN AMB NON-IMAGING

## 2023-07-05 NOTE — Progress Notes (Signed)
   07/05/2023 9:45 AM   Kevin Charles March [birth year] [id number]  Reason for visit: Follow up PSA screening, urinary symptoms  HPI: 63 year old male with a long history of urinary dribbling for over 20 years that requires a pad during the day.  This is minimally bothersome to him.  It is typically postvoid urinary dribbling, and he denies stress or urge incontinence.  Urinalysis and PVR have been normal.  We have followed him for PSA screening as well, and these have essentially been stable over the last 5 years.  He does have 1 isolated elevated PSA of 4.77 in December 2021, but this decreased to 3.0 on recheck.    PSA this year on 06/22/2023 is stable at 3.9.  He denies any major changes in urination over the last year.  We again reviewed the AUA guidelines regarding PSA screening, and that the overall trend is more important than individual values.  His PSA trend is overall very reassuring, and remains within the normal range.  Recommend continuing PSA screening every 1 to 2 years through age 76 per the AUA guidelines.  Regarding the postvoid dribbling, we discussed options like Kegel exercises or trial of Flomax, but he is not bothered enough at this time to consider medications.  RTC 1 year PSA reflex free prior, PVR   Sondra Come, MD  [facility] [address], [address] [place], [place] [address] [phone]

## 2023-07-10 ENCOUNTER — Telehealth: Payer: Self-pay

## 2023-07-10 ENCOUNTER — Ambulatory Visit (INDEPENDENT_AMBULATORY_CARE_PROVIDER_SITE_OTHER): Payer: Medicare Other | Admitting: Family Medicine

## 2023-07-10 ENCOUNTER — Encounter: Payer: Self-pay | Admitting: Family Medicine

## 2023-07-10 VITALS — BP 130/78 | HR 60 | Temp 97.7°F | Ht 71.0 in | Wt 233.2 lb

## 2023-07-10 DIAGNOSIS — M109 Gout, unspecified: Secondary | ICD-10-CM

## 2023-07-10 DIAGNOSIS — G43909 Migraine, unspecified, not intractable, without status migrainosus: Secondary | ICD-10-CM | POA: Diagnosis not present

## 2023-07-10 DIAGNOSIS — E785 Hyperlipidemia, unspecified: Secondary | ICD-10-CM | POA: Diagnosis not present

## 2023-07-10 DIAGNOSIS — E039 Hypothyroidism, unspecified: Secondary | ICD-10-CM

## 2023-07-10 DIAGNOSIS — Z0000 Encounter for general adult medical examination without abnormal findings: Secondary | ICD-10-CM

## 2023-07-10 DIAGNOSIS — G40909 Epilepsy, unspecified, not intractable, without status epilepticus: Secondary | ICD-10-CM

## 2023-07-10 DIAGNOSIS — Z23 Encounter for immunization: Secondary | ICD-10-CM

## 2023-07-10 DIAGNOSIS — I10 Essential (primary) hypertension: Secondary | ICD-10-CM

## 2023-07-10 DIAGNOSIS — Z7189 Other specified counseling: Secondary | ICD-10-CM

## 2023-07-10 MED ORDER — ROSUVASTATIN CALCIUM 20 MG PO TABS: 20.0000 mg | ORAL_TABLET | ORAL | 3 refills | Status: DC

## 2023-07-10 MED ORDER — LEVOTHYROXINE SODIUM 150 MCG PO TABS: ORAL_TABLET | ORAL | 3 refills | Status: DC

## 2023-07-10 MED ORDER — CHLORTHALIDONE 25 MG PO TABS: 25.0000 mg | ORAL_TABLET | ORAL | 3 refills | Status: DC

## 2023-07-10 MED ORDER — DIVALPROEX SODIUM ER 500 MG PO TB24: ORAL_TABLET | ORAL | Status: DC

## 2023-07-10 MED ORDER — ALLOPURINOL 200 MG PO TABS: 200.0000 mg | ORAL_TABLET | Freq: Every day | ORAL | 3 refills | Status: DC

## 2023-07-10 MED ORDER — LOSARTAN POTASSIUM 25 MG PO TABS: 25.0000 mg | ORAL_TABLET | Freq: Every day | ORAL | 3 refills | Status: DC

## 2023-07-10 MED ORDER — PANTOPRAZOLE SODIUM 40 MG PO TBEC: DELAYED_RELEASE_TABLET | ORAL | 3 refills | Status: DC

## 2023-07-10 MED ORDER — ROSUVASTATIN CALCIUM 20 MG PO TABS: 20.0000 mg | ORAL_TABLET | ORAL | Status: DC

## 2023-07-10 MED ORDER — RIZATRIPTAN BENZOATE 5 MG PO TBDP: ORAL_TABLET | ORAL | 12 refills | Status: DC

## 2023-07-10 NOTE — Patient Instructions (Addendum)
Try taking crestor twice a week for about 2 weeks.  If tolerated, try to go up to 3 per week.  If you have symptoms then cut back and let me know.  Take care.  Glad to see you.  Please call GI at [phone] if they don't call your first.   Flu shot today.   Take care.  Glad to see you.

## 2023-07-10 NOTE — Progress Notes (Unsigned)
Advance directive- Cheryl Flash designated if patient were incapacitated.  Colonoscopy 2022- dw pt about f/u 2025.   PSA 2025 tdap 2024 Flu shot yearly Shingles 2021 PNA not due yet.   covid vaccine prev done.     Elevated Cholesterol: Using medications without problems: yes Muscle aches: no Diet compliance: yes Exercise: yes He is taking crestor once per week- tolerated.  No ADE on med.  D/w pt about slow increase in dose.  Prev paresthesias and cramps got better off med.     Hypothyroidism.  Compliant.  No ADE on med.  labs d/w pt.  No neck mass.  No dysphagia.     Hypertension:               Using medication without problems or lightheadedness: yes Chest pain with exertion:no exertional sx Edema: occ, not consistently.   Short of breath:no   SZ hx noted. Compliant. No events.  He has continued for years w/o events.  FH noted, mother had SZ.   He has still has some episodic migraines, more in summertime. No ADE on med.  With relief with maxalt.  Still used episodically with relief.   Gout is improved with higher dose of allopurinol.  Prednisone helped.    Labs d/w pt.   PMH and SH reviewed  Meds, vitals, and allergies reviewed.   ROS: Per HPI.  Unless specifically indicated otherwise in HPI, the patient denies:  General: fever. Eyes: acute vision changes ENT: sore throat Cardiovascular: chest pain Respiratory: SOB GI: vomiting GU: dysuria Musculoskeletal: acute back pain Derm: acute rash Neuro: acute motor dysfunction Psych: worsening mood Endocrine: polydipsia Heme: bleeding Allergy: hayfever  GEN: nad, alert and oriented HEENT: ncat NECK: supple w/o LA CV: rrr. PULM: ctab, no inc wob ABD: soft, +bs EXT: no edema SKIN: no acute rash

## 2023-07-10 NOTE — Telephone Encounter (Signed)
Pt requesting call back to schedule colonoscopy.

## 2023-07-11 ENCOUNTER — Other Ambulatory Visit: Payer: Self-pay

## 2023-07-11 ENCOUNTER — Telehealth: Payer: Self-pay

## 2023-07-11 DIAGNOSIS — Z860100 Personal history of colon polyps, unspecified: Secondary | ICD-10-CM

## 2023-07-11 MED ORDER — NA SULFATE-K SULFATE-MG SULF 17.5-3.13-1.6 GM/177ML PO SOLN: 1.0000 | Freq: Once | ORAL | 0 refills | Status: AC

## 2023-07-11 NOTE — Assessment & Plan Note (Signed)
Continue chlorthalidone and losartan

## 2023-07-11 NOTE — Assessment & Plan Note (Signed)
D/w pt about slow increase in crestor as tolerated.  See AVS.  Prev paresthesias and cramps got better off med.

## 2023-07-11 NOTE — Assessment & Plan Note (Signed)
Continue levothyroxine 

## 2023-07-11 NOTE — Assessment & Plan Note (Signed)
Advance directive- Kevin Charles designated if patient were incapacitated.  Colonoscopy 2022- dw pt about f/u 2025.   PSA 2025 tdap 2024 Flu shot yearly Shingles 2021 PNA not due yet.   covid vaccine prev done.

## 2023-07-11 NOTE — Telephone Encounter (Signed)
okay

## 2023-07-11 NOTE — Telephone Encounter (Signed)
Gastroenterology Pre-Procedure Review  Request Date: 09/10/23 Requesting Physician: Dr. Tobi Bastos  PATIENT REVIEW QUESTIONS: The patient responded to the following health history questions as indicated:    1. Are you having any GI issues? no 2. Do you have a personal history of Polyps? yes (last colonoscopy performed by Dr. Tobi Bastos 08/30/20 recommended repeat in 3 years) 3. Do you have a family history of Colon Cancer or Polyps? no 4. Diabetes Mellitus? no 5. Joint replacements in the past 12 months?no 6. Major health problems in the past 3 months?no 7. Any artificial heart valves, MVP, or defibrillator?no    MEDICATIONS & ALLERGIES:    Patient reports the following regarding taking any anticoagulation/antiplatelet therapy:   Plavix, Coumadin, Eliquis, Xarelto, Lovenox, Pradaxa, Brilinta, or Effient? no Aspirin? 81mg  daily  Patient confirms/reports the following medications:  Current Outpatient Medications  Medication Sig Dispense Refill   allopurinol 200 MG TABS Take 200 mg by mouth daily. 90 tablet 3   aspirin 81 MG tablet Take 81 mg by mouth daily.     Benzoyl Peroxide 10 % CREA Apply to face at night.     chlorthalidone (HYGROTON) 25 MG tablet Take 1 tablet (25 mg total) by mouth every other day. 45 tablet 3   Cyanocobalamin (B-12) 2000 MCG TABS Take 1 tablet by mouth daily.     divalproex (DEPAKOTE ER) 500 MG 24 hr tablet TAKE 2 TABLETS BY MOUTH TWICE  DAILY EXCEPT SUNDAY AND  WEDNESDAY ONLY TAKE 1 TABLET IN  THE MORNING AND 2 TABLETS IN THE EVENING     fexofenadine (ALLEGRA) 60 MG tablet Take 60 mg by mouth daily.     indomethacin (INDOCIN) 50 MG capsule Take 1 capsule (50 mg total) by mouth 3 (three) times daily with meals. As needed for gout pain (Patient not taking: Reported on 07/10/2023) 90 capsule 0   levothyroxine (SYNTHROID) 150 MCG tablet TAKE 1 TABLET BY MOUTH ONCE DAILY-except take half tablet on Sundays.  6.5 tablets/week in total 100 tablet 3   losartan (COZAAR) 25 MG tablet  Take 1 tablet (25 mg total) by mouth daily. 100 tablet 3   pantoprazole (PROTONIX) 40 MG tablet Taking 1 pill 5 times per week. 100 tablet 3   rizatriptan (MAXALT-MLT) 5 MG disintegrating tablet TAKE ONE (1) TABLET BY MOUTH AS NEEDED FOR MIGRAINE; MAY REPEAT IN 2 HOURS IF NEEDED 4 tablet 12   rosuvastatin (CRESTOR) 20 MG tablet Take 1 tablet (20 mg total) by mouth 3 (three) times a week. If tolerated. 40 tablet 3   SAW PALMETTO, SERENOA REPENS, PO Take 1 or 2 tablets by mouth daily.     No current facility-administered medications for this visit.    Patient confirms/reports the following allergies:  Allergies  Allergen Reactions   Flonase [Fluticasone]     nosebleeds   Metoprolol     bradycardia   Pravastatin Sodium     Aches with pravastatin but able to tolerate lipitor    No orders of the defined types were placed in this encounter.   AUTHORIZATION INFORMATION Primary Insurance: 1D#: Group #:  Secondary Insurance: 1D#: Group #:  SCHEDULE INFORMATION: Date: 09/10/23 Time: Location: [facility]

## 2023-07-11 NOTE — Telephone Encounter (Signed)
Call returned. Colonoscopy scheduled with Dr. Tobi Bastos 09/10/23.  Thanks,  [place], [place]

## 2023-07-11 NOTE — Assessment & Plan Note (Signed)
Controlled, continue divalproex.

## 2023-07-11 NOTE — Assessment & Plan Note (Signed)
Advance directive- Don Hicks designated if patient were incapacitated.  

## 2023-07-11 NOTE — Assessment & Plan Note (Signed)
With relief with maxalt.  Still used episodically with relief. Continue as is.

## 2023-07-11 NOTE — Assessment & Plan Note (Signed)
Gout is improved with higher dose of allopurinol.  Prednisone helped.   Labs d/w pt. Continue as is.

## 2023-07-16 ENCOUNTER — Other Ambulatory Visit: Payer: Self-pay | Admitting: Family Medicine

## 2023-07-16 ENCOUNTER — Other Ambulatory Visit (HOSPITAL_COMMUNITY): Payer: Self-pay

## 2023-07-17 ENCOUNTER — Ambulatory Visit: Payer: Medicare Other | Admitting: Dermatology

## 2023-07-17 ENCOUNTER — Encounter: Payer: Self-pay | Admitting: Dermatology

## 2023-07-17 DIAGNOSIS — L570 Actinic keratosis: Secondary | ICD-10-CM

## 2023-07-17 DIAGNOSIS — L821 Other seborrheic keratosis: Secondary | ICD-10-CM

## 2023-07-17 DIAGNOSIS — R238 Other skin changes: Secondary | ICD-10-CM

## 2023-07-17 DIAGNOSIS — Z85828 Personal history of other malignant neoplasm of skin: Secondary | ICD-10-CM | POA: Diagnosis not present

## 2023-07-17 DIAGNOSIS — D492 Neoplasm of unspecified behavior of bone, soft tissue, and skin: Secondary | ICD-10-CM | POA: Diagnosis not present

## 2023-07-17 DIAGNOSIS — C4491 Basal cell carcinoma of skin, unspecified: Secondary | ICD-10-CM

## 2023-07-17 DIAGNOSIS — C44612 Basal cell carcinoma of skin of right upper limb, including shoulder: Secondary | ICD-10-CM

## 2023-07-17 HISTORY — DX: Actinic keratosis: L57.0

## 2023-07-17 HISTORY — DX: Basal cell carcinoma of skin, unspecified: C44.91

## 2023-07-17 NOTE — Patient Instructions (Signed)

## 2023-07-17 NOTE — Progress Notes (Signed)
   Follow-Up Visit   Subjective  Kevin Charles is a 64 y.o. male who presents for the following: Spots. Right anterior shoulder, left ear, behind right ear.  Hx of BCCs. The lesion on the left ear has been Tx with LN2 07/13/2022. Dr. Katrinka Blazing noticed lesion on right anterior shoulder at last visit.   The patient has spots, moles and lesions to be evaluated, some may be new or changing and the patient may have concern these could be cancer.    The following portions of the chart were reviewed this encounter and updated as appropriate: medications, allergies, medical history  Review of Systems:  No other skin or systemic complaints except as noted in HPI or Assessment and Plan.  Objective  Well appearing patient in no apparent distress; mood and affect are within normal limits.  A focused examination was performed of the following areas: Face, ears, right anterior shoulder  Relevant physical exam findings are noted in the Assessment and Plan.  Left Helix 6 mm scaly thin papule  Right Shoulder - Anterior 7 mm pink papule with telangiectasias and pigment globules        Assessment & Plan   NEOPLASM OF SKIN (2) Left Helix Skin / nail biopsy Type of biopsy: tangential   Informed consent: discussed and consent obtained   Timeout: patient name, date of birth, surgical site, and procedure verified   Procedure prep:  Patient was prepped and draped in usual sterile fashion Prep type:  Isopropyl alcohol Anesthesia: the lesion was anesthetized in a standard fashion   Anesthetic:  1% lidocaine w/ epinephrine 1-100,000 buffered w/ 8.4% NaHCO3 Instrument used: DermaBlade   Hemostasis achieved with: pressure and aluminum chloride   Outcome: patient tolerated procedure well   Post-procedure details: sterile dressing applied and wound care instructions given   Dressing type: bandage and petrolatum   Specimen 2 - Surgical pathology Differential Diagnosis: SCC vs AK  Check Margins:  No Right Shoulder - Anterior Skin / nail biopsy Type of biopsy: tangential   Informed consent: discussed and consent obtained   Timeout: patient name, date of birth, surgical site, and procedure verified   Procedure prep:  Patient was prepped and draped in usual sterile fashion Prep type:  Isopropyl alcohol Anesthesia: the lesion was anesthetized in a standard fashion   Anesthetic:  1% lidocaine w/ epinephrine 1-100,000 buffered w/ 8.4% NaHCO3 Instrument used: DermaBlade   Hemostasis achieved with: pressure and aluminum chloride   Outcome: patient tolerated procedure well   Post-procedure details: sterile dressing applied and wound care instructions given   Dressing type: bandage and petrolatum   Specimen 1 - Surgical pathology Differential Diagnosis: BCC  Check Margins: No  Skin coloured papule R post auricular, stable and present many years per patient Ddx dermal nevus vs EIC  Plan: monitor     Return for TBSE As Scheduled.  I, Lawson Radar, CMA, am acting as scribe for Elie Goody, MD.   Documentation: I have reviewed the above documentation for accuracy and completeness, and I agree with the above.  Elie Goody, MD

## 2023-07-18 LAB — SURGICAL PATHOLOGY

## 2023-07-19 ENCOUNTER — Telehealth: Payer: Self-pay

## 2023-07-19 ENCOUNTER — Ambulatory Visit: Payer: Medicare Other | Attending: Cardiology | Admitting: Cardiology

## 2023-07-19 ENCOUNTER — Encounter: Payer: Self-pay | Admitting: Cardiology

## 2023-07-19 VITALS — BP 138/72 | HR 62 | Ht 71.0 in | Wt 235.2 lb

## 2023-07-19 DIAGNOSIS — I10 Essential (primary) hypertension: Secondary | ICD-10-CM

## 2023-07-19 DIAGNOSIS — E785 Hyperlipidemia, unspecified: Secondary | ICD-10-CM

## 2023-07-19 DIAGNOSIS — I2510 Atherosclerotic heart disease of native coronary artery without angina pectoris: Secondary | ICD-10-CM

## 2023-07-19 NOTE — Telephone Encounter (Signed)
Patient informed of pathology results and surgery scheduled.

## 2023-07-19 NOTE — Progress Notes (Signed)
Cardiology Office Note:  .   Date:  07/19/2023  ID:  FILOMENO CROMLEY, DOB 1961/05/08, MRN [id number] PCP: [name], MD  [facility] Providers Cardiologist:  Lorine Bears, MD    History of Present Illness: .   Kevin Charles is a 63 y.o. male with a past medical history of coronary artery disease, hypertension, seizure disorder, hyperlipidemia, hypothyroidism, previous COVID-19 infection, migraines, GERD, who is here today for follow-up on his coronary artery disease.  Patient sustained a small non-ST elevated myocardial infarction in November 2015.  Cardiac catheterization showed mild to moderate nonobstructive disease with 40% stenosis affecting the LAD.  Echocardiogram at that time revealed borderline LVH, PA pressures were not assessed, LVEF of 60-65%.  Patient was last seen in clinic 07/20/2022 by Dr.Arida.  At that time he had been doing fine with no complaints of pain.  In the previous October he has suffered a COVID-19 infection and became bradycardic after being prescribed hydrocodone for cough.  His metoprolol was discontinued.  He had subsequently seen an elevated shin and his blood pressure and losartan was started.  He had been doing well since that time with no other complaints.  His atorvastatin was changed to rosuvastatin and he was to repeat a lipid and liver profile in 2 months with an LDL needing to be below 70.  No further testing was ordered at that time.  He returns to clinic today stating that overall from a cardiac perspective that he has been doing well.  He stated that he flew back several months ago and that he has fairly send.  States that he had several bouts of gout which required him to come off of his cholesterol medication and is slowly restarted and he is up to 2 days/week with adding 1/3-day on the upcoming week.  Blood pressure is slightly elevated on arrival this morning but rechecked and improved.  He denies any chest pain, shortness of breath,  palpitations or peripheral edema.  States that he has been compliant with his current medication regimen.  Denies any hospitalizations or visits to the emergency department.  ROS: 10 point review of system has been reviewed and considered negative with exception of what is been listed in HPI  Studies Reviewed: Marland Kitchen   EKG Interpretation Date/Time:  Thursday July 19 2023 10:34:54 EST Ventricular Rate:  62 PR Interval:  120 QRS Duration:  90 QT Interval:  380 QTC Calculation: 385 R Axis:   -20  Text Interpretation: Normal sinus rhythm Normal ECG When compared with ECG of 17-Apr-2014 21:03, No significant change was found Confirmed by Charlsie Quest ([id number]) on 07/19/2023 10:52:51 AM     Risk Assessment/Calculations:             Physical Exam:   VS:  BP 138/72 (BP Location: Left Arm, Patient Position: Sitting, Cuff Size: Large)   Pulse 62   Ht 5\' 11"  (1.803 m)   Wt 235 lb 3.2 oz (106.7 kg)   SpO2 98%   BMI 32.80 kg/m    Wt Readings from Last 3 Encounters:  07/19/23 235 lb 3.2 oz (106.7 kg)  07/10/23 233 lb 3.2 oz (105.8 kg)  07/05/23 232 lb (105.2 kg)    GEN: Well nourished, well developed in no acute distress NECK: No JVD; No carotid bruits CARDIAC: RRR, no murmurs, rubs, gallops RESPIRATORY:  Clear to auscultation without rales, wheezing or rhonchi  ABDOMEN: Soft, non-tender, non-distended EXTREMITIES:  No edema; No deformity   ASSESSMENT AND PLAN: .  Coronary artery disease of the native coronary arteries without angina.  He had mild one-vessel disease by heart catheterization in 2015.  Continues to deny angina or anginal equivalents.  EKG today reveals sinus rhythm with a rate of 62 with a left axis deviation with no acute changes.  He is continued on aspirin 81 mg daily and rosuvastatin 20 mg increasing to 3 days/week.  No further testing is needed at this time.  Hypertension with blood pressure on arrival 155/82 and on recheck 138/72.  He has been continued on  chlorthalidone 25 mg daily and losartan 25 mg daily.  He has also been encouraged to continue to monitor his blood pressures 1 to 2 hours postmedication administration.  Hyperlipidemia with his last LDL of 146.  He is now back on his rosuvastatin with increasing the days per week that he takes starting it 3 days next week.  Target is LDL below 70.  We did discuss injectables and he is not interested at this time and would like to continue to try to increase his rosuvastatin with rechecking his lipid panel in several months with his PCP.       Dispo: Patient return to clinic to see MD/APP in 6 months or sooner if needed  Signed, Juris Gosnell, NP  4 by Dr. Patient was

## 2023-07-19 NOTE — Telephone Encounter (Signed)
-----   Message from [facility] sent at 07/19/2023  9:39 AM EST ----- Diagnosis 1. Skin, right shoulder - anterior :       BASAL CELL CARCINOMA, SUPERFICIAL AND NODULAR PATTERNS        2. Skin, left helix :       ACTINIC KERATOSIS AND SEBORRHEIC KERATOSIS      Please call to share diagnosis and discuss treatment options.  1. R shoulder Explanation: your biopsy shows basal cell skin cancers in the second layer of the skin. This is the most common kind of skin cancer and is caused by damage from sun exposure. Basal cell skin cancers almost never spread beyond the skin, so they are not dangerous to your overall health. However, they will continue to grow, can bleed, cause nonhealing wounds, and disrupt nearby structures unless fully treated.   Treatment option 1: you return for a brief appointment where I perform electrodesiccation and curettage [facility]). This involves three rounds of scraping and burning to destroy the skin cancer. It has about an 80% cure rate and leaves a round wound slightly larger than the skin cancer and leaves a round white scar. No additional pathology is done. If the skin cancer comes back, we would need to do a surgery to remove it.   Treatment option 2: Excision - you return for an hour long appointment in our clinic where we perform a skin surgery. We numb the site of the skin cancer and a safety margin of normal skin around it. We remove the full thickness of skin and close the wound with two layers of stitches. The sample is sent to the lab to check that the skin cancer was fully removed. Return one week later to have wound checked and surface stitches removed. Surgical wound leaves a line scar. Approximately 95% cure rate. Risk of recurrence, bleeding, infection, pain, injury to nearby structures, hypertrophic scar.  2. L ear Biopsy shows a precancer. It should resolve after being removed. If the skin grows back and is scaly or raised, please contact us

## 2023-07-19 NOTE — Patient Instructions (Signed)
Medication Instructions:  Your physician recommends that you continue on your current medications as directed. Please refer to the Current Medication list given to you today.   *If you need a refill on your cardiac medications before your next appointment, please call your pharmacy*   Lab Work: No labs ordered today    Testing/Procedures: No test ordered today    Follow-Up: At [facility], you and your health needs are our priority.  As part of our continuing mission to provide you with exceptional heart care, we have created designated Provider Care Teams.  These Care Teams include your primary Cardiologist (physician) and Advanced Practice Providers (APPs -  Physician Assistants and Nurse Practitioners) who all work together to provide you with the care you need, when you need it.  We recommend signing up for the patient portal called "MyChart".  Sign up information is provided on this After Visit Summary.  MyChart is used to connect with patients for Virtual Visits (Telemedicine).  Patients are able to view lab/test results, encounter notes, upcoming appointments, etc.  Non-urgent messages can be sent to your provider as well.   To learn more about what you can do with MyChart, go to [email or web address].    Your next appointment:   6 month(s)  Provider:   You may see Lorine Bears, MD or one of the following Advanced Practice Providers on your designated Care Team:   Nicolasa Ducking, NP Eula Listen, PA-C Cadence Fransico Michael, PA-C Charlsie Quest, NP Carlos Levering, NP

## 2023-07-24 ENCOUNTER — Telehealth: Payer: Self-pay | Admitting: Family Medicine

## 2023-07-24 DIAGNOSIS — M109 Gout, unspecified: Secondary | ICD-10-CM

## 2023-07-24 NOTE — Telephone Encounter (Signed)
 Copied from CRM [id number]. Topic: Clinical - Prescription Issue >> Jul 24, 2023  8:42 AM Mercedes MATSU wrote: Reason for CRM: Patient called in stating that he saw Dr. Cleatus for his CPE and he ordered him a new prescription with [facility] for his gout medication Allopurinol  200mg . Patient was sent a 1 month supply, received a letter from [facility] saying that the medication is not covered by the formulary. They sent over an alternative which is the 100mg  tablet. Patient is requesting Dr. Cleatus call or fax over something asking them to switch the medication back over to 100mg  and just change the directions to twice a day it'll be covered by his insurance. Patient is requesting a call back to insure that the medication change was made.

## 2023-07-25 ENCOUNTER — Telehealth: Payer: Self-pay

## 2023-07-25 MED ORDER — ALLOPURINOL 100 MG PO TABS: 200.0000 mg | ORAL_TABLET | Freq: Every day | ORAL | 3 refills | Status: DC

## 2023-07-25 NOTE — Telephone Encounter (Signed)
 Closing encounter. Medication has been sent and patient has been notified

## 2023-07-25 NOTE — Telephone Encounter (Signed)
 Patient notified

## 2023-07-25 NOTE — Telephone Encounter (Signed)
 Copied from CRM [id number]. Topic: Clinical - Prescription Issue >> Jul 25, 2023  1:06 PM Russell PARAS wrote: Reason for CRM: Patient called in stating that he saw Dr. Cleatus for his CPE and he ordered him a new prescription with [facility] for his gout medication Allopurinol  200mg . Patient was sent a 1 month supply, received a letter from [facility] saying that the medication is not covered by the formulary. They sent over an alternative which is the 100mg  tablet. Patient is requesting Dr. Cleatus call or fax over something asking them to switch the medication back over to 100mg  and just change the directions to twice a day it'll be covered by his insurance. CB#[phone], requested call back to give him update if this has been corrected.

## 2023-07-25 NOTE — Telephone Encounter (Signed)
 Sent. Thanks.

## 2023-07-25 NOTE — Addendum Note (Signed)
 Addended by: Donnie Galea on: 07/25/2023 01:51 PM   Modules accepted: Orders

## 2023-08-02 ENCOUNTER — Other Ambulatory Visit: Payer: Self-pay | Admitting: Family

## 2023-08-21 ENCOUNTER — Telehealth: Payer: Self-pay

## 2023-08-21 NOTE — Telephone Encounter (Signed)
 Patient advised. Aw

## 2023-08-21 NOTE — Telephone Encounter (Signed)
 Patient called to let you know he does take 81mg  of Asprin daily due to his upcoming surgery on 04/02 with you. Would you like patient to d/c anytime before surgery?

## 2023-09-10 ENCOUNTER — Ambulatory Visit
Admission: RE | Admit: 2023-09-10 | Discharge: 2023-09-10 | Disposition: A | Discharge status: Home / Self Care | Payer: Medicare Other | Source: Ambulatory Visit | Attending: Gastroenterology | Admitting: Gastroenterology

## 2023-09-10 ENCOUNTER — Encounter
Admission: RE | Disposition: A | Discharge status: Home / Self Care | Payer: Self-pay | Source: Ambulatory Visit | Attending: Gastroenterology

## 2023-09-10 ENCOUNTER — Ambulatory Visit

## 2023-09-10 ENCOUNTER — Encounter: Payer: Self-pay | Admitting: Gastroenterology

## 2023-09-10 DIAGNOSIS — Z87891 Personal history of nicotine dependence: Secondary | ICD-10-CM | POA: Insufficient documentation

## 2023-09-10 DIAGNOSIS — K5730 Diverticulosis of large intestine without perforation or abscess without bleeding: Secondary | ICD-10-CM | POA: Insufficient documentation

## 2023-09-10 DIAGNOSIS — K219 Gastro-esophageal reflux disease without esophagitis: Secondary | ICD-10-CM | POA: Insufficient documentation

## 2023-09-10 DIAGNOSIS — D122 Benign neoplasm of ascending colon: Secondary | ICD-10-CM | POA: Diagnosis not present

## 2023-09-10 DIAGNOSIS — D120 Benign neoplasm of cecum: Secondary | ICD-10-CM | POA: Insufficient documentation

## 2023-09-10 DIAGNOSIS — I2510 Atherosclerotic heart disease of native coronary artery without angina pectoris: Secondary | ICD-10-CM | POA: Insufficient documentation

## 2023-09-10 DIAGNOSIS — E039 Hypothyroidism, unspecified: Secondary | ICD-10-CM | POA: Diagnosis not present

## 2023-09-10 DIAGNOSIS — Z1211 Encounter for screening for malignant neoplasm of colon: Secondary | ICD-10-CM | POA: Insufficient documentation

## 2023-09-10 DIAGNOSIS — K635 Polyp of colon: Secondary | ICD-10-CM | POA: Diagnosis not present

## 2023-09-10 DIAGNOSIS — G40909 Epilepsy, unspecified, not intractable, without status epilepticus: Secondary | ICD-10-CM | POA: Insufficient documentation

## 2023-09-10 DIAGNOSIS — I10 Essential (primary) hypertension: Secondary | ICD-10-CM | POA: Diagnosis not present

## 2023-09-10 DIAGNOSIS — Z860100 Personal history of colon polyps, unspecified: Secondary | ICD-10-CM

## 2023-09-10 DIAGNOSIS — D126 Benign neoplasm of colon, unspecified: Secondary | ICD-10-CM

## 2023-09-10 DIAGNOSIS — I252 Old myocardial infarction: Secondary | ICD-10-CM | POA: Insufficient documentation

## 2023-09-10 HISTORY — PX: POLYPECTOMY: SHX5525

## 2023-09-10 HISTORY — PX: COLONOSCOPY WITH PROPOFOL: SHX5780

## 2023-09-10 SURGERY — COLONOSCOPY WITH PROPOFOL
Anesthesia: General

## 2023-09-10 MED ORDER — SODIUM CHLORIDE 0.9% FLUSH
3.0000 mL | Freq: Two times a day (BID) | INTRAVENOUS | Status: DC
Start: 2023-09-10 — End: 2023-09-10

## 2023-09-10 MED ORDER — SODIUM CHLORIDE 0.9 % IV SOLN: INTRAVENOUS | Status: DC | PRN

## 2023-09-10 MED ORDER — SODIUM CHLORIDE 0.9% FLUSH: 3.0000 mL | INTRAVENOUS | Status: DC | PRN

## 2023-09-10 MED ORDER — PROPOFOL 500 MG/50ML IV EMUL
INTRAVENOUS | Status: DC | PRN
  Administered 2023-09-10: 100 mg via INTRAVENOUS
  Administered 2023-09-10: 200 ug/kg/min via INTRAVENOUS

## 2023-09-10 NOTE — Op Note (Signed)
 [facility] Patient Name: Kevin Charles Procedure Date: 09/10/2023 9:37 AM MRN: [id number] Account #: [id number] Date of Birth: [birth year] Admit Type: Outpatient Age: 63 Room: [facility] ENDO ROOM 1 Gender: Male Note Status: Finalized Instrument Name: Prentice Docker [id number] Procedure:             Colonoscopy Indications:           Surveillance: Personal history of adenomatous polyps                         on last colonoscopy 5 years ago, Last colonoscopy:                         March 2022 Providers:             [name] MD, MD Medicines:             Monitored Anesthesia Care Complications:         No immediate complications. Procedure:             Pre-Anesthesia Assessment:                        - Prior to the procedure, a History and Physical was                         performed, and patient medications, allergies and                         sensitivities were reviewed. The patient's tolerance                         of previous anesthesia was reviewed.                        - The risks and benefits of the procedure and the                         sedation options and risks were discussed with the                         patient. All questions were answered and informed                         consent was obtained.                        - ASA Grade Assessment: II - A patient with mild                         systemic disease.                        After obtaining informed consent, the colonoscope was                         passed under direct vision. Throughout the procedure,                         the patient's blood pressure, pulse, and oxygen  saturations were monitored continuously. The                         Colonoscope was introduced through the anus and                         advanced to the the cecum, identified by the                         appendiceal orifice. The colonoscopy was performed                          with ease. The patient tolerated the procedure well.                         The quality of the bowel preparation was good. The                         ileocecal valve, appendiceal orifice, and rectum were                         photographed. Findings:      The perianal and digital rectal examinations were normal.      A 5 mm polyp was found in the cecum. The polyp was sessile. The polyp       was removed with a cold snare. Resection and retrieval were complete.      Two sessile polyps were found in the ascending colon. The polyps were 4       to 5 mm in size. These polyps were removed with a cold snare. Resection       and retrieval were complete.      Multiple medium-mouthed diverticula were found in the left colon.      The exam was otherwise without abnormality on direct and retroflexion       views. Impression:            - One 5 mm polyp in the cecum, removed with a cold                         snare. Resected and retrieved.                        - Two 4 to 5 mm polyps in the ascending colon, removed                         with a cold snare. Resected and retrieved.                        - Diverticulosis in the left colon.                        - The examination was otherwise normal on direct and                         retroflexion views. Recommendation:        - Discharge patient to home (with escort).                        - Resume  previous diet.                        - Continue present medications.                        - Await pathology results.                        - Repeat colonoscopy in 3 - 5 years for surveillance                         based on pathology results. Procedure Code(s):     --- Professional ---                        [phone], Colonoscopy, flexible; with removal of                         tumor(s), polyp(s), or other lesion(s) by snare                         technique Diagnosis Code(s):     --- Professional ---                         Z86.010, Personal history of colonic polyps                        D12.0, Benign neoplasm of cecum                        D12.2, Benign neoplasm of ascending colon                        K57.30, Diverticulosis of large intestine without                         perforation or abscess without bleeding CPT copyright 2022 American Medical Association. All rights reserved. The codes documented in this report are preliminary and upon coder review may  be revised to meet current compliance requirements. [name], MD [name] MD, MD 09/10/2023 10:27:10 AM This report has been signed electronically. Number of Addenda: 0 Note Initiated On: 09/10/2023 9:37 AM Scope Withdrawal Time: 0 hours 13 minutes 0 seconds  Total Procedure Duration: 0 hours 19 minutes 14 seconds  Estimated Blood Loss:  Estimated blood loss: none.      [facility]
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
## 2023-09-10 NOTE — Transfer of Care (Signed)
 Immediate Anesthesia Transfer of Care Note  Patient: Kevin Charles  Procedure(s) Performed: COLONOSCOPY WITH PROPOFOL POLYPECTOMY  Patient Location: PACU  Anesthesia Type:General  Level of Consciousness: sedated  Airway & Oxygen Therapy: Patient Spontanous Breathing  Post-op Assessment: Report given to RN and Post -op Vital signs reviewed and stable  Post vital signs: Reviewed and stable  Last Vitals:  Vitals Value Taken Time  BP 123/66 09/10/23 1027  Temp 35.9 C 09/10/23 1027  Pulse 60 09/10/23 1028  Resp 14 09/10/23 1028  SpO2 99 % 09/10/23 1028  Vitals shown include unfiled device data.  Last Pain:  Vitals:   09/10/23 1027  TempSrc: Tympanic  PainSc: Asleep         Complications: There were no known notable events for this encounter.

## 2023-09-11 LAB — SURGICAL PATHOLOGY

## 2023-09-11 NOTE — Anesthesia Postprocedure Evaluation (Signed)
 Anesthesia Post Note  Patient: Kevin Charles  Procedure(s) Performed: COLONOSCOPY WITH PROPOFOL POLYPECTOMY  Patient location during evaluation: PACU Anesthesia Type: General Level of consciousness: awake and alert Pain management: pain level controlled Vital Signs Assessment: post-procedure vital signs reviewed and stable Respiratory status: spontaneous breathing, nonlabored ventilation and respiratory function stable Cardiovascular status: blood pressure returned to baseline and stable Postop Assessment: no apparent nausea or vomiting Anesthetic complications: no   There were no known notable events for this encounter.   Last Vitals:  Vitals:   09/10/23 1037 09/10/23 1047  BP: 106/68 123/80  Pulse: 62 85  Resp: 14 16  Temp:    SpO2: 99% 100%    Last Pain:  Vitals:   09/11/23 0737  TempSrc:   PainSc: 0-No pain                 Foye Deer

## 2023-09-12 ENCOUNTER — Encounter: Payer: Self-pay | Admitting: Gastroenterology

## 2023-09-19 ENCOUNTER — Encounter: Payer: Self-pay | Admitting: Dermatology

## 2023-09-19 ENCOUNTER — Ambulatory Visit: Payer: Medicare Other | Admitting: Dermatology

## 2023-09-19 DIAGNOSIS — C4491 Basal cell carcinoma of skin, unspecified: Secondary | ICD-10-CM

## 2023-09-19 DIAGNOSIS — L988 Other specified disorders of the skin and subcutaneous tissue: Secondary | ICD-10-CM | POA: Diagnosis not present

## 2023-09-19 DIAGNOSIS — C44612 Basal cell carcinoma of skin of right upper limb, including shoulder: Secondary | ICD-10-CM

## 2023-09-19 MED ORDER — MUPIROCIN 2 % EX OINT: 1.0000 | TOPICAL_OINTMENT | Freq: Every day | CUTANEOUS | 0 refills | Status: DC

## 2023-09-19 NOTE — Progress Notes (Signed)
   Follow-Up Visit   Subjective  Kevin Charles is a 63 y.o. male who presents for the following: Excision of bx proven BCC at right shoulder  The following portions of the chart were reviewed this encounter and updated as appropriate: medications, allergies, medical history  Review of Systems:  No other skin or systemic complaints except as noted in HPI or Assessment and Plan.  Objective  Well appearing patient in no apparent distress; mood and affect are within normal limits.  A focused examination was performed of the following areas: Right shoulder Relevant physical exam findings are noted in the Assessment and Plan.   Right Shoulder - Anterior Healing bx site  Assessment & Plan   BASAL CELL CARCINOMA (BCC), UNSPECIFIED SITE Right Shoulder - Anterior Skin excision  Lesion length (cm):  0.8 Margin per side (cm):  0.4 Total excision diameter (cm):  1.6 Informed consent: discussed and consent obtained   Timeout: patient name, date of birth, surgical site, and procedure verified   Procedure prep:  Patient was prepped and draped in usual sterile fashion Prep type:  Chlorhexidine Anesthesia: the lesion was anesthetized in a standard fashion   Anesthetic:  1% lidocaine w/ epinephrine 1-100,000 buffered w/ 8.4% NaHCO3 (9 cc) Instrument used: #15 blade   Hemostasis achieved with: pressure   Outcome: patient tolerated procedure well with no complications    Skin repair Complexity:  Intermediate Final length (cm):  5.3 Informed consent: discussed and consent obtained   Timeout: patient name, date of birth, surgical site, and procedure verified   Procedure prep:  Patient was prepped and draped in usual sterile fashion Prep type:  Chlorhexidine Anesthesia: the lesion was anesthetized in a standard fashion   Anesthetic:  1% lidocaine w/ epinephrine 1-100,000 buffered w/ 8.4% NaHCO3 Reason for type of repair: reduce tension to allow closure, reduce the risk of dehiscence,  infection, and necrosis, reduce subcutaneous dead space and avoid a hematoma, allow closure of the large defect and preserve normal anatomy   Undermining: edges undermined   Subcutaneous layers (deep stitches):  Suture size:  3-0 Suture type: Monocryl (poliglecaprone 25)   Stitches:  Buried vertical mattress Fine/surface layer approximation (top stitches):  Suture size:  4-0 Suture type: Prolene (polypropylene)   Stitches: simple running   Suture removal (days):  14 Hemostasis achieved with: suture, pressure and electrodesiccation Outcome: patient tolerated procedure well with no complications   Post-procedure details: sterile dressing applied and wound care instructions given   Dressing type: petrolatum, bandage and pressure dressing   Specimen 1 - Surgical pathology Differential Diagnosis: BX proven BCC Lateral tag Check Margins: yes [id number]   Return in about 2 weeks (around 10/03/2023) for Suture Removal.  Anise Salvo, RMA, am acting as scribe for Elie Goody, MD .   Documentation: I have reviewed the above documentation for accuracy and completeness, and I agree with the above.  Elie Goody, MD

## 2023-09-19 NOTE — Patient Instructions (Signed)

## 2023-09-21 LAB — SURGICAL PATHOLOGY

## 2023-09-24 ENCOUNTER — Telehealth: Payer: Self-pay

## 2023-09-24 NOTE — Telephone Encounter (Signed)
-----   Message from [place] sent at 09/21/2023  4:57 PM EDT ----- Diagnosis right shoulder - anterior :       NO RESIDUAL BASAL CELL CARCINOMA, MARGINS FREE   Please call to share that excision was clear of skin cancer and get update on surgical wound. Thank you.

## 2023-09-24 NOTE — Telephone Encounter (Signed)
 Discussed biopsy results with patient NO RESIDUAL BASAL CELL CARCINOMA, MARGINS FREE.  Patient also report surgery site is healing well, no problems

## 2023-09-24 NOTE — Telephone Encounter (Signed)
 Discussed biopsy results with patient, NO RESIDUAL BASAL CELL CARCINOMA, MARGINS FREE

## 2023-10-03 ENCOUNTER — Other Ambulatory Visit: Payer: Self-pay | Admitting: Family Medicine

## 2023-10-04 ENCOUNTER — Encounter: Payer: Self-pay | Admitting: Dermatology

## 2023-10-04 ENCOUNTER — Ambulatory Visit: Admitting: Dermatology

## 2023-10-04 DIAGNOSIS — Z5189 Encounter for other specified aftercare: Secondary | ICD-10-CM

## 2023-10-04 DIAGNOSIS — Z4802 Encounter for removal of sutures: Secondary | ICD-10-CM

## 2023-10-04 NOTE — Patient Instructions (Addendum)

## 2023-10-04 NOTE — Progress Notes (Signed)
   Follow-Up Visit   Subjective  Kevin Charles is a 63 y.o. male who presents for the following: Suture removal. Reports some itching around adhesive from band-aid.   Pathology showed NO RESIDUAL BASAL CELL CARCINOMA, MARGINS FREE at right anterior shoulder.  The following portions of the chart were reviewed this encounter and updated as appropriate: medications, allergies, medical history  Review of Systems:  No other skin or systemic complaints except as noted in HPI or Assessment and Plan.  Objective  Well appearing patient in no apparent distress; mood and affect are within normal limits.  Areas Examined: Right shoulder Relevant physical exam findings are noted in the Assessment and Plan.    Assessment & Plan   VISIT FOR WOUND CHECK   ENCOUNTER FOR REMOVAL OF SUTURES   Encounter for Removal of Sutures - Incision site is clean, dry and intact. - Wound cleansed, sutures removed, wound cleansed and steri strips applied.  - Discussed pathology results showing NO RESIDUAL BASAL CELL CARCINOMA, MARGINS FREE at right anterior shoulder. - Scars remodel for a full year. - Patient can apply over-the-counter silicone scar cream once to twice a day to help with scar remodeling if desired. - Patient advised to call with any concerns or if they notice any new or changing lesions.  Offered patient sending in something topical for itching around site, but patient defers will apply Vaseline to area and call us  with any changes.   Return in about 6 months (around 04/04/2024) for TBSE, w/ Dr. Felipe Horton, Hx BCC.  I, Jacquelynn Vera, CMA, am acting as scribe for Harris Liming, MD .   Documentation: I have reviewed the above documentation for accuracy and completeness, and I agree with the above.  Harris Liming, MD

## 2023-11-13 ENCOUNTER — Encounter: Payer: Self-pay | Admitting: Family Medicine

## 2023-11-13 ENCOUNTER — Ambulatory Visit: Payer: Self-pay

## 2023-11-13 ENCOUNTER — Ambulatory Visit (INDEPENDENT_AMBULATORY_CARE_PROVIDER_SITE_OTHER): Admitting: Family Medicine

## 2023-11-13 VITALS — BP 114/78 | HR 69 | Temp 98.0°F | Ht 71.0 in | Wt 229.2 lb

## 2023-11-13 DIAGNOSIS — L237 Allergic contact dermatitis due to plants, except food: Secondary | ICD-10-CM

## 2023-11-13 MED ORDER — PREDNISONE 20 MG PO TABS: ORAL_TABLET | ORAL | 0 refills | Status: DC

## 2023-11-13 NOTE — Telephone Encounter (Signed)
 Noted. Thanks.  Has OV today.

## 2023-11-13 NOTE — Assessment & Plan Note (Signed)
 Acute, no sign of anaphylaxis or systemic symptoms. Will treat with prednisone  taper for likely plant contact dermatitis.  60/40/20 over 7 days. He will continue using topical clobetasol  for itching as needed.  Return and ER precautions provided.  Patient to go to ER if any lip swelling tongue swelling or difficulty breathing.

## 2023-11-13 NOTE — Telephone Encounter (Signed)
 Chief Complaint: poison ivy Symptoms: red spots on face, neck and right arm Frequency: x 5 days Pertinent Negatives: Patient denies fever Disposition: [] ED /[] Urgent Care (no appt availability in office) / [x] Appointment(In office/virtual)/ []  [facility]/ [] Home Care/ [] Refused Recommended Disposition /[] [facility]/ []  Follow-up with PCP  Additional Notes: pt states that he got into poison ivy or oak. States that is on his arm and face since last Thursday. States that he usually gets it about 2-3 times a year. States he normally get prednisone  to help. States on right arm is about 3-4 small spots and better but the areas on his face and neck are still very itching but not as red.   Copied from CRM [id number]. Topic: Clinical - Red Word Triage >> Nov 13, 2023  8:04 AM El Gravely T wrote: [facility] that prompted transfer to Nurse Triage: Poison ivy, skin irritation and itching rash on face, arms Reason for Disposition  MODERATE to SEVERE itching (e.g., interferes with work, school, sleep, or other activities)  Protocols used: Poison Ivy - Oak - [place]

## 2023-11-13 NOTE — Telephone Encounter (Signed)
 Seen by Dr. Cherlyn Cornet today at 9:20 am.

## 2023-11-13 NOTE — Progress Notes (Signed)
 Patient ID: Kevin Charles, male    DOB: 11-09-1960, 63 y.o.   MRN: [id number]  This visit was conducted in person.  BP 114/78   Pulse 69   Temp 98 F (36.7 C) (Temporal)   Ht 5\' 11"  (1.803 m)   Wt 229 lb 4 oz (104 kg)   SpO2 99%   BMI 31.97 kg/m    CC:  Chief Complaint  Patient presents with   Rash    Poison Ivy    Subjective:   HPI: Kevin Charles is a 63 y.o. male presenting on 11/13/2023 for Rash [facility])  New onset itchy blistering rash on left neck , spreading to right antecubital fossa and chest.  Occurred after  getting papers from patch  outside pool area. Very allergic to poision ivy.  No oral swelling, no SOB, no trouble swallowing.  Has been using clobetasol  and cortisone on area.   Still very itchy.      Relevant past medical, surgical, family and social history reviewed and updated as indicated. Interim medical history since our last visit reviewed. Allergies and medications reviewed and updated. Outpatient Medications Prior to Visit  Medication Sig Dispense Refill   allopurinol  (ZYLOPRIM ) 100 MG tablet Take 2 tablets (200 mg total) by mouth daily. 180 tablet 3   aspirin 81 MG tablet Take 81 mg by mouth daily.     Benzoyl Peroxide 10 % CREA Apply to face at night.     chlorthalidone  (HYGROTON ) 25 MG tablet Take 1 tablet (25 mg total) by mouth every other day. 45 tablet 3   Cyanocobalamin  (B-12) 2000 MCG TABS Take 1 tablet by mouth daily.     divalproex  (DEPAKOTE  ER) 500 MG 24 hr tablet TAKE 2 TABLETS BY MOUTH TWICE  DAILY EXCEPT SUNDAY AND  WEDNESDAY ONLY TAKE 1 TABLET IN  THE MORNING AND 2 TABLETS IN THE EVENING 370 tablet 2   fexofenadine (ALLEGRA) 60 MG tablet Take 60 mg by mouth daily.     indomethacin  (INDOCIN ) 50 MG capsule Take 1 capsule (50 mg total) by mouth 3 (three) times daily with meals. As needed for gout pain 90 capsule 0   levothyroxine  (SYNTHROID ) 150 MCG tablet TAKE 1 TABLET BY MOUTH ONCE DAILY-except take half tablet on  Sundays.  6.5 tablets/week in total 100 tablet 3   losartan  (COZAAR ) 25 MG tablet Take 1 tablet (25 mg total) by mouth daily. 100 tablet 3   pantoprazole  (PROTONIX ) 40 MG tablet Taking 1 pill 5 times per week. 100 tablet 3   rizatriptan  (MAXALT -MLT) 5 MG disintegrating tablet TAKE ONE (1) TABLET BY MOUTH AS NEEDED FOR MIGRAINE; MAY REPEAT IN 2 HOURS IF NEEDED 4 tablet 12   rosuvastatin  (CRESTOR ) 20 MG tablet Take 20 mg by mouth. 1 or 2 per week     SAW PALMETTO, SERENOA REPENS, PO Take 1 or 2 tablets by mouth daily.     rosuvastatin  (CRESTOR ) 20 MG tablet Take 1 tablet (20 mg total) by mouth 3 (three) times a week. If tolerated. (Patient taking differently: Take 20 mg by mouth. 1 or 2 per week) 40 tablet 3   mupirocin  ointment (BACTROBAN ) 2 % Apply 1 Application topically daily. 22 g 0   No facility-administered medications prior to visit.     Per HPI unless specifically indicated in ROS section below Review of Systems  Constitutional:  Negative for fatigue and fever.  HENT:  Negative for ear pain.   Eyes:  Negative for pain.  Respiratory:  Negative for cough and shortness of breath.   Cardiovascular:  Negative for chest pain, palpitations and leg swelling.  Gastrointestinal:  Negative for abdominal pain.  Genitourinary:  Negative for dysuria.  Musculoskeletal:  Negative for arthralgias.  Neurological:  Negative for syncope, light-headedness and headaches.  Psychiatric/Behavioral:  Negative for dysphoric mood.    Objective:  BP 114/78   Pulse 69   Temp 98 F (36.7 C) (Temporal)   Ht 5\' 11"  (1.803 m)   Wt 229 lb 4 oz (104 kg)   SpO2 99%   BMI 31.97 kg/m   Wt Readings from Last 3 Encounters:  11/13/23 229 lb 4 oz (104 kg)  09/10/23 230 lb 9.6 oz (104.6 kg)  07/19/23 235 lb 3.2 oz (106.7 kg)      Physical Exam Vitals reviewed.  Constitutional:      Appearance: He is well-developed.  HENT:     Head: Normocephalic.     Right Ear: Hearing normal.     Left Ear: Hearing  normal.     Nose: Nose normal.  Neck:     Thyroid : No thyroid  mass or thyromegaly.     Vascular: No carotid bruit.     Trachea: Trachea normal.  Cardiovascular:     Rate and Rhythm: Normal rate and regular rhythm.     Pulses: Normal pulses.     Heart sounds: Heart sounds not distant. No murmur heard.    No friction rub. No gallop.     Comments: No peripheral edema Pulmonary:     Effort: Pulmonary effort is normal. No respiratory distress.     Breath sounds: Normal breath sounds.  Skin:    General: Skin is warm and dry.     Findings: Rash present.     Comments: Erythematous papules in right antecubital fossa, excoriations and slight erythema on right upper chest and left neck  Slight redness at base of right eye... may be due to chronic allergies.  Psychiatric:        Speech: Speech normal.        Behavior: Behavior normal.        Thought Content: Thought content normal.       Results for orders placed or performed in visit on 09/19/23  Surgical pathology   Collection Time: 09/19/23 12:00 AM  Result Value Ref Range   SURGICAL PATHOLOGY      SURGICAL PATHOLOGY [facility] [address] [place], [place] [address] Telephone [phone] or [phone] Fax [phone]  REPORT OF DERMATOPATHOLOGY   Accession #: [id number] Patient Name: HASSAN, BLACKSHIRE Visit # : [id number]  MRN: [id number] Cytotechnologist: Darcy Eaton Md, Lovett Ruck, Dermatopathologist, Electronic Signature DOB/Age [birth year] (Age: 3) Gender: M Collected Date: 09/19/2023 Received Date: 09/19/2023  FINAL DIAGNOSIS       1. Skin (M), right shoulder - anterior :       NO RESIDUAL BASAL CELL CARCINOMA, MARGINS FREE       DATE SIGNED OUT: 09/21/2023 ELECTRONIC SIGNATURE : Munoz-Bishop Md, Melissa, Dermatopathologist, Electronic Signature  MICROSCOPIC DESCRIPTION 1. There are inflammatory and reactive changes consistent with a prior procedure.  No residual basal cell  carcinoma is seen.   The margins are free of malignancy.  CASE COMMENTS STAINS USED IN DIAGNOSIS: H&E H&E H&E    CLINICAL HISTO RY  SPECIMEN(S) OBTAINED 1. Skin (M), Right Shoulder - Anterior  SPECIMEN COMMENTS: 1. Healing biopsy site, check margins, [id number], lateral tag SPECIMEN CLINICAL INFORMATION: 1. Basal cell carcinoma (BCC),  unspecified site, biopsy proven BCC    Gross Description 1. Shape:  Includes limited to no fat; elliptical      Size: 38 x 11 x 7 mm      Lesion: No lesion grossly observed; appears to be a scar.      Orientation: Specimen has been clinically tagged at margin designated as 12 o'      clock.      12 o'clock to 6 o'clock inked black to include 3 o'clock; 6 o'clock to 12      o'clock inked yellow to include 9 o'clock; 12 o'clock is also inked green.      Block summary: Specimen has been serially sectioned and submitted.      A - B = 4 Cross sections; 3 o'clock black, 9 o'clock yellow      C = 2 radial sections; 12 o'clock green, 6 o'clock black and yellow.      3 Block(s) submitted. (alb)        Report signed out from the following location(s) [facility] [address] [place], [place] [address] CLIA #: [id number]  [facility] [address] [place], [place] [address] CLIA #: [id number]     Assessment and Plan  Contact dermatitis due to poison ivy Assessment & Plan: Acute, no sign of anaphylaxis or systemic symptoms. Will treat with prednisone  taper for likely plant contact dermatitis.  60/40/20 over 7 days. He will continue using topical clobetasol  for itching as needed.  Return and ER precautions provided.  Patient to go to ER if any lip swelling tongue swelling or difficulty breathing.   Other orders -     predniSONE ; 3 tabs by mouth daily x 3 days, then 2 tabs by mouth daily x 2 days then 1 tab by mouth daily x 2 days  Dispense: 15 tablet; Refill: 0    No follow-ups on file.    Herby Lolling, MD

## 2023-11-27 ENCOUNTER — Ambulatory Visit: Payer: Medicare Other | Admitting: Dermatology

## 2023-11-28 ENCOUNTER — Telehealth: Payer: Self-pay | Admitting: Family Medicine

## 2023-11-28 NOTE — Telephone Encounter (Signed)
 Scheduled patient to be seen on 12/04/2023

## 2023-11-28 NOTE — Telephone Encounter (Signed)
 He needs an OV.  Please schedule.  We can do labs at the visit.  Thanks. He doesn't need to fast.

## 2023-11-28 NOTE — Telephone Encounter (Signed)
 Do you want to see patient or do you want him to just come in for lab work? Please advise

## 2023-11-28 NOTE — Telephone Encounter (Signed)
 Copied from CRM [id number]. Topic: Clinical - Request for Lab/Test Order >> Nov 28, 2023  9:22 AM Kevin Charles wrote: Reason for CRM: Patient is requesting to have his A1c/thyroid  checked. Has been very weak and shaky. Patient would like to be schedule around the 8th-15th preferably a Tuesday if possible. Please call [phone]

## 2023-12-04 ENCOUNTER — Encounter: Payer: Self-pay | Admitting: Family Medicine

## 2023-12-04 ENCOUNTER — Ambulatory Visit (INDEPENDENT_AMBULATORY_CARE_PROVIDER_SITE_OTHER): Admitting: Family Medicine

## 2023-12-04 VITALS — BP 128/68 | HR 69 | Temp 98.0°F | Ht 71.0 in | Wt 225.0 lb

## 2023-12-04 DIAGNOSIS — E162 Hypoglycemia, unspecified: Secondary | ICD-10-CM | POA: Diagnosis not present

## 2023-12-04 DIAGNOSIS — M109 Gout, unspecified: Secondary | ICD-10-CM | POA: Diagnosis not present

## 2023-12-04 LAB — TSH: TSH: 1.89 u[IU]/mL (ref 0.35–5.50)

## 2023-12-04 LAB — BASIC METABOLIC PANEL WITH GFR
BUN: 21 mg/dL (ref 6–23)
CO2: 30 meq/L (ref 19–32)
Calcium: 9.9 mg/dL (ref 8.4–10.5)
Chloride: 101 meq/L (ref 96–112)
Creatinine, Ser: 0.84 mg/dL (ref 0.40–1.50)
GFR: 92.84 mL/min (ref >60.00)
Glucose, Bld: 109 mg/dL — ABNORMAL HIGH (ref 70–99)
Potassium: 3.8 meq/L (ref 3.5–5.1)
Sodium: 140 meq/L (ref 135–145)

## 2023-12-04 MED ORDER — PANTOPRAZOLE SODIUM 40 MG PO TBEC: DELAYED_RELEASE_TABLET | ORAL | Status: DC

## 2023-12-04 MED ORDER — INDOMETHACIN 50 MG PO CAPS: 50.0000 mg | ORAL_CAPSULE | Freq: Three times a day (TID) | ORAL | 1 refills | Status: DC

## 2023-12-04 NOTE — Patient Instructions (Addendum)
 Go to the lab on the way out.   If you have mychart we'll likely use that to update you.    Try to limit prolonged fasting.  Keep a snack nearby.   Take care.  Glad to see you. Drink enough water to keep your urine clear.

## 2023-12-04 NOTE — Progress Notes (Unsigned)
 He can tolerate crestor  1-2 times per week.    No recent gout flares or indomethacin  use.   He feels some better today.  He had longstanding history where he would have sensation of diffuse weakness that would improve with a snack.  It was usually relatively rare.  More recently he had several events where he felt shaky and weak.  He ate some cookies and improved.  He had another episode working at [employer], similar sx, improved after eating carbs. No sx this week.    D/w pt about diet, meal planning, inc in protein, etc.  He is living along and that affects meal organization.  He usually has breakfast about 6:30 or 7 AM most days.  Lunch can be between 11:30-1:30, supper between 5 and 6 PM.  D/w pt about carb intake.    Recent days with sx were with sig work and his meal schedule may have been disrupted.    He feels better today.    Meds, vitals, and allergies reviewed.   ROS: Per HPI unless specifically indicated in ROS section   GEN: nad, alert and oriented HEENT: mucous membranes moist NECK: supple w/o LA CV: rrr.  PULM: ctab, no inc wob ABD: soft, +bs EXT: no edema SKIN: Well-perfused

## 2023-12-06 ENCOUNTER — Ambulatory Visit: Payer: Self-pay | Admitting: Family Medicine

## 2023-12-06 DIAGNOSIS — E162 Hypoglycemia, unspecified: Secondary | ICD-10-CM | POA: Insufficient documentation

## 2023-12-06 NOTE — Assessment & Plan Note (Signed)
 It sounds like he had relative symptomatic hypoglycemia.  He is not on any medications that would likely cause this. Advised to try to limit prolonged fasting.  Keep a snack nearby.   Drink enough water to keep your urine clear.  Update me as needed.  He agrees with plan.  See notes on labs.

## 2023-12-13 DIAGNOSIS — H25813 Combined forms of age-related cataract, bilateral: Secondary | ICD-10-CM | POA: Diagnosis not present

## 2024-01-15 ENCOUNTER — Ambulatory Visit: Admitting: Cardiology

## 2024-01-22 ENCOUNTER — Ambulatory Visit: Admitting: Cardiology

## 2024-01-23 ENCOUNTER — Ambulatory Visit: Admitting: Cardiology

## 2024-01-23 ENCOUNTER — Ambulatory Visit: Attending: Cardiology | Admitting: Cardiology

## 2024-01-23 ENCOUNTER — Encounter: Payer: Self-pay | Admitting: Cardiology

## 2024-01-23 VITALS — BP 123/70 | HR 60 | Ht 75.0 in | Wt 231.4 lb

## 2024-01-23 DIAGNOSIS — I2510 Atherosclerotic heart disease of native coronary artery without angina pectoris: Secondary | ICD-10-CM

## 2024-01-23 DIAGNOSIS — I10 Essential (primary) hypertension: Secondary | ICD-10-CM | POA: Diagnosis not present

## 2024-01-23 DIAGNOSIS — E785 Hyperlipidemia, unspecified: Secondary | ICD-10-CM | POA: Diagnosis not present

## 2024-01-23 MED ORDER — CHLORTHALIDONE 25 MG PO TABS: 25.0000 mg | ORAL_TABLET | ORAL | 3 refills | Status: DC

## 2024-01-23 NOTE — Patient Instructions (Signed)
 Medication Instructions:  Your physician recommends that you continue on your current medications as directed. Please refer to the Current Medication list given to you today.   *If you need a refill on your cardiac medications before your next appointment, please call your pharmacy*  Lab Work: No labs ordered today  If you have labs (blood work) drawn today and your tests are completely normal, you will receive your results only by: MyChart Message (if you have MyChart) OR A paper copy in the mail If you have any lab test that is abnormal or we need to change your treatment, we will call you to review the results.  Testing/Procedures: No test ordered today   Follow-Up: At [facility], you and your health needs are our priority.  As part of our continuing mission to provide you with exceptional heart care, our providers are all part of one team.  This team includes your primary Cardiologist (physician) and Advanced Practice Providers or APPs (Physician Assistants and Nurse Practitioners) who all work together to provide you with the care you need, when you need it.  Your next appointment:   6 month(s)  Provider:   Antionette Kirks, MD or Ronald Cockayne, NP

## 2024-01-23 NOTE — Progress Notes (Signed)
 Cardiology Office Note   Date:  01/23/2024  ID:  Kevin Charles, DOB Jan 05, 1961, MRN [id number] PCP: Cleatus Arlyss RAMAN, MD  [facility] Providers Cardiologist:  Deatrice Cage, MD     History of Present Illness Kevin Charles is a 63 y.o. male with a past medical history of coronary artery disease, hypertension, seizure disorder, hyperlipidemia, hypothyroidism, previous COVID-19 infection, migraines, GERD, who is here today to follow-up on his coronary artery disease.   Patient sustained a small ST elevated myocardial infarction November 2015.  Cardiac catheterization showed mild to moderate nonobstructive disease with 40% stenosis suspected LAD.  Echocardiogram at that time revealed borderline LVH, PA pressures were not assessed, LVEF of 60 to 65%.  He was last seen in clinic 07/20/2022.  At that time he was doing well with no complaints of pain.  In the previous activities over COVID-19 infection he became bradycardic after being prescribed hydrocodone  for cough.  His metoprolol  was subsequently discontinued.  Blood pressures had been elevated and he was started on losartan .  He had been doing well since that time and no other complaints.  His atorvastatin  was changed to rosuvastatin  and he was to repeat a lipid and liver profile in 2 months with an LDL need to be below 70.  No further testing was ordered at that time.   He was last seen in clinic 07/19/2023 stating overall from the cardiac perspective he had been doing well.  He stated that he had flown back several months ago and that he had had several bouts of gout which required him to come off his cholesterol medication slowly restarted and he was up to 2 days a week with adding 1/3-day on the upcoming week.  Blood pressures were slightly elevated on arrival this morning but rechecked with improvement.  He returns to clinic today stating overall from the cardiac perspective he has been doing well.  Has had complete issues with  indigestion but it was when he ate and immediately went to bed.  Nothing with exertion or activity.  Denies any shortness of breath, palpitations, peripheral edema.  States that he has been compliant with his current medication regimen without any undue side effects.  Denies any hospitalizations or visits to the emergency department.  ROS: 10 point review of systems has been reviewed and considered negative with exception was been listed in the HPI  Studies Reviewed EKG Interpretation Date/Time:  Wednesday January 23 2024 11:07:00 EDT Ventricular Rate:  60 PR Interval:  126 QRS Duration:  90 QT Interval:  390 QTC Calculation: 390 R Axis:   -18  Text Interpretation: Normal sinus rhythm Normal ECG When compared with ECG of 19-Jul-2023 10:34, No significant change was found Confirmed by Kevin Charles ([id number]) on 01/23/2024 11:16:53 AM    Risk Assessment/Calculations           Physical Exam VS:  BP 123/70 (BP Location: Left Arm, Patient Position: Sitting, Cuff Size: Large)   Pulse 60   Ht 6' 3 (1.905 m)   Wt 231 lb 6.4 oz (105 kg)   SpO2 99%   BMI 28.92 kg/m        Wt Readings from Last 3 Encounters:  01/23/24 231 lb 6.4 oz (105 kg)  12/04/23 225 lb (102.1 kg)  11/13/23 229 lb 4 oz (104 kg)    GEN: Well nourished, well developed in no acute distress NECK: No JVD; No carotid bruits CARDIAC: RRR, no murmurs, rubs, gallops RESPIRATORY:  Clear to auscultation without  rales, wheezing or rhonchi  ABDOMEN: Soft, non-tender, non-distended EXTREMITIES:  No edema; No deformity   ASSESSMENT AND PLAN Coronary disease of native coronary arteries without angina.  Patient had mild one-vessel disease by catheterization in 2015.  Continues to deny angina or anginal equivalents.  EKG today reveals sinus rhythm with rate of 61 left axis deviation with no acute ischemic changes.  He has been continued on aspirin 81 mg daily rosuvastatin  20 mg twice daily.  No further ischemic workup is needed at  this time.  Primary hypertension with blood pressure 123/70.  Blood pressure remains well-controlled.  He is continued on chlorthalidone  25 mg daily and losartan  25 mg daily.  He has been encouraged to continue to monitor his pressures 1 to 2 hours postmedication administration as well.  Hyperlipidemia with his last LDL 156.  He has continued on rosuvastatin  twice a week.  Target of LDL is below 70.  He has upcoming labs with his PCP.       Dispo: Patient to return to clinic to see MD/APP in 6 months or sooner if needed for further evaluation.  Signed, Deandra Goering, NP

## 2024-02-19 ENCOUNTER — Ambulatory Visit: Payer: Self-pay | Admitting: *Deleted

## 2024-02-19 NOTE — Telephone Encounter (Signed)
 Noted. Thanks.

## 2024-02-19 NOTE — Telephone Encounter (Signed)
 NOTED

## 2024-02-19 NOTE — Telephone Encounter (Signed)
 Patient has appt tomorrow with Tabitha Dugal, NP.

## 2024-02-19 NOTE — Telephone Encounter (Signed)
 Copied from CRM [id number]. Topic: Clinical - Red Word Triage >> Feb 19, 2024  9:05 AM Precious C wrote: [facility] that prompted transfer to Nurse Triage: SHORT OF BREATH/COUGH  Patient called in due to COVID-like symptoms. He reports having a bad cough and significant drainage. He also stated that he experienced a slight fever yesterday. Reason for Disposition  [1] Continuous (nonstop) coughing interferes with work or school AND [2] no improvement using cough treatment per Care Advice  Answer Assessment - Initial Assessment Questions 1. ONSET: When did the cough begin?      I'm having terrible drainage down the back of my throat.  It's making me cough bad.   It's worse when I lay down.   It started yesterday getting real bad.  I slept in the recliner due to the coughing. 2. SEVERITY: How bad is the cough today?      Bad and getting worse.  Especially at night 3. SPUTUM: Describe the color of your sputum (e.g., none, dry cough; clear, white, yellow, green)     Nothing coming up   4. HEMOPTYSIS: Are you coughing up any blood? If Yes, ask: How much? (e.g., flecks, streaks, tablespoons, etc.)     Not asked I tried Tylenol and rx cough medicine and Mucinex .   It's not helping.   I feel like I have a low grade fever.   5. DIFFICULTY BREATHING: Are you having difficulty breathing? If Yes, ask: How bad is it? (e.g., mild, moderate, severe)      I feel a little short of breath due to the coughing 6. FEVER: Do you have a fever? If Yes, ask: What is your temperature, how was it measured, and when did it start?     Low grade 7. CARDIAC HISTORY: Do you have any history of heart disease? (e.g., heart attack, congestive heart failure)      Not asked 8. LUNG HISTORY: Do you have any history of lung disease?  (e.g., pulmonary embolus, asthma, emphysema)     No 9. PE RISK FACTORS: Do you have a history of blood clots? (or: recent major surgery, recent prolonged travel, bedridden)      Not asked 10. OTHER SYMPTOMS: Do you have any other symptoms? (e.g., runny nose, wheezing, chest pain)       Runny nose, drainage down my throat 11. PREGNANCY: Is there any chance you are pregnant? When was your last menstrual period?       N/A 12. TRAVEL: Have you traveled out of the country in the last month? (e.g., travel history, exposures)       N/A  Protocols used: Cough - Acute Non-Productive-A-AH FYI Only or Action Required?: FYI only for provider.  Patient was last seen in primary care on 12/04/2023 by Cleatus Arlyss RAMAN, MD.  Called Nurse Triage reporting Cough.  Symptoms began several days ago.  Interventions attempted: OTC medications: Mucinex , cough drops, warm lemon water.  Symptoms are: rapidly worsening .  Triage Disposition: See Physician Within 24 Hours  Patient/caregiver understands and will follow disposition?: Yes

## 2024-02-20 ENCOUNTER — Ambulatory Visit: Admitting: Family

## 2024-02-20 ENCOUNTER — Encounter: Payer: Self-pay | Admitting: Family

## 2024-02-20 ENCOUNTER — Telehealth: Payer: Self-pay | Admitting: Family Medicine

## 2024-02-20 VITALS — BP 134/86 | HR 86 | Temp 101.1°F | Wt 230.4 lb

## 2024-02-20 DIAGNOSIS — R051 Acute cough: Secondary | ICD-10-CM | POA: Diagnosis not present

## 2024-02-20 DIAGNOSIS — R509 Fever, unspecified: Secondary | ICD-10-CM

## 2024-02-20 DIAGNOSIS — Z20822 Contact with and (suspected) exposure to covid-19: Secondary | ICD-10-CM

## 2024-02-20 DIAGNOSIS — J208 Acute bronchitis due to other specified organisms: Secondary | ICD-10-CM

## 2024-02-20 DIAGNOSIS — U071 COVID-19: Secondary | ICD-10-CM

## 2024-02-20 LAB — POCT INFLUENZA A/B
Influenza A, POC: NEGATIVE
Influenza B, POC: NEGATIVE

## 2024-02-20 LAB — POC COVID19 BINAXNOW: SARS Coronavirus 2 Ag: POSITIVE — AB

## 2024-02-20 MED ORDER — AMOXICILLIN-POT CLAVULANATE 875-125 MG PO TABS: 1.0000 | ORAL_TABLET | Freq: Two times a day (BID) | ORAL | 0 refills | Status: DC

## 2024-02-20 MED ORDER — PREDNISONE 10 MG (21) PO TBPK
ORAL_TABLET | ORAL | 0 refills | Status: DC
Start: 2024-02-20 — End: 2024-05-08

## 2024-02-20 NOTE — Progress Notes (Signed)
 Established Patient Office Visit  Subjective:      CC:  Chief Complaint  Patient presents with   Acute Visit    Reports cough, PND, low grad temp, nausea/vomiting, headache. Denies sore throat.    HPI: Kevin Charles is a 63 y.o. male presenting on 02/20/2024 for Acute Visit (Reports cough, PND, low grad temp, nausea/vomiting, headache. Denies sore throat.) .  Discussed the use of AI scribe software for clinical note transcription with the patient, who gave verbal consent to proceed.  History of Present Illness Kevin Charles is a 63 year old male who presents with COVID-19 symptoms.  He has been experiencing body aches and a severe cough that prevents him from lying down comfortably, necessitating naps in a chair. Symptoms began mid-morning on Monday, approximately three days ago. He also mentions having a fever, although he has not checked his temperature. Shivering at night and in the early morning has disrupted his sleep for the past two nights. He compares these symptoms to a previous COVID-19 infection.  He reports nausea and vomiting after eating shrimp and hush puppies. He has been using a prescription cough syrup, similar to Robitussin, which is still effective until December of this year.   No ear pain but he notes a scratchy throat. He has been spitting up light-colored phlegm, which he describes as less severe than during a sinus infection or bad cold. He experienced a brief episode of wheezing and shortness of breath.  He is currently taking amoxicillin  and has previously used prednisone  for gout. He is not taking Mucinex , as he did not believe he had a sinus infection.         Social history:  Relevant past medical, surgical, family and social history reviewed and updated as indicated. Interim medical history since our last visit reviewed.  Allergies and medications reviewed and updated.  DATA REVIEWED: CHART IN EPIC     ROS: Negative unless  specifically indicated above in HPI.    Current Outpatient Medications:    allopurinol  (ZYLOPRIM ) 100 MG tablet, Take 2 tablets (200 mg total) by mouth daily., Disp: 180 tablet, Rfl: 3   amoxicillin -clavulanate (AUGMENTIN ) 875-125 MG tablet, Take 1 tablet by mouth 2 (two) times daily., Disp: 20 tablet, Rfl: 0   aspirin 81 MG tablet, Take 81 mg by mouth daily., Disp: , Rfl:    Benzoyl Peroxide 10 % CREA, Apply to face at night., Disp: , Rfl:    chlorthalidone  (HYGROTON ) 25 MG tablet, Take 1 tablet (25 mg total) by mouth every other day., Disp: 45 tablet, Rfl: 3   Cyanocobalamin  (B-12) 2000 MCG TABS, Take 1 tablet by mouth daily., Disp: , Rfl:    divalproex  (DEPAKOTE  ER) 500 MG 24 hr tablet, TAKE 2 TABLETS BY MOUTH TWICE  DAILY EXCEPT SUNDAY AND  WEDNESDAY ONLY TAKE 1 TABLET IN  THE MORNING AND 2 TABLETS IN THE EVENING, Disp: 370 tablet, Rfl: 2   fexofenadine (ALLEGRA) 60 MG tablet, Take 60 mg by mouth daily., Disp: , Rfl:    levothyroxine  (SYNTHROID ) 150 MCG tablet, TAKE 1 TABLET BY MOUTH ONCE DAILY-except take half tablet on Sundays.  6.5 tablets/week in total, Disp: 100 tablet, Rfl: 3   losartan  (COZAAR ) 25 MG tablet, Take 1 tablet (25 mg total) by mouth daily., Disp: 100 tablet, Rfl: 3   pantoprazole  (PROTONIX ) 40 MG tablet, Taking 1 pill 4 times per week., Disp: , Rfl:    predniSONE  (STERAPRED UNI-PAK 21 TAB) 10 MG (21) TBPK tablet,  Take as directed, Disp: 1 each, Rfl: 0   rizatriptan  (MAXALT -MLT) 5 MG disintegrating tablet, TAKE ONE (1) TABLET BY MOUTH AS NEEDED FOR MIGRAINE; MAY REPEAT IN 2 HOURS IF NEEDED, Disp: 4 tablet, Rfl: 12   rosuvastatin  (CRESTOR ) 20 MG tablet, Take 20 mg by mouth. 1 or 2 per week, Disp: , Rfl:    SAW PALMETTO, SERENOA REPENS, PO, Take 1 or 2 tablets by mouth daily., Disp: , Rfl:    indomethacin  (INDOCIN ) 50 MG capsule, Take 1 capsule (50 mg total) by mouth 3 (three) times daily with meals. As needed for gout pain (Patient not taking: Reported on 02/20/2024), Disp: 90  capsule, Rfl: 1        Objective:        BP 134/86 (BP Location: Right Arm, Patient Position: Sitting, Cuff Size: Large)   Pulse 86   Temp (!) 101.1 F (38.4 C) (Temporal)   Wt 230 lb 6.4 oz (104.5 kg)   SpO2 97%   BMI 28.80 kg/m   Physical Exam   Wt Readings from Last 3 Encounters:  02/20/24 230 lb 6.4 oz (104.5 kg)  01/23/24 231 lb 6.4 oz (105 kg)  12/04/23 225 lb (102.1 kg)    Physical Exam Vitals reviewed.  Constitutional:      General: He is not in acute distress.    Appearance: Normal appearance. He is obese. He is not ill-appearing, toxic-appearing or diaphoretic.  HENT:     Head: Normocephalic.     Right Ear: Tympanic membrane normal.     Left Ear: Tympanic membrane normal.     Nose: Nose normal.     Mouth/Throat:     Mouth: Mucous membranes are moist.  Eyes:     Pupils: Pupils are equal, round, and reactive to light.  Cardiovascular:     Rate and Rhythm: Normal rate and regular rhythm.  Pulmonary:     Effort: Pulmonary effort is normal.     Breath sounds: Normal breath sounds. No wheezing.  Musculoskeletal:        General: Normal range of motion.     Cervical back: Normal range of motion.  Neurological:     General: No focal deficit present.     Mental Status: He is alert and oriented to person, place, and time. Mental status is at baseline.  Psychiatric:        Mood and Affect: Mood normal.        Behavior: Behavior normal.        Thought Content: Thought content normal.        Judgment: Judgment normal.          Results   Assessment & Plan:   Assessment and Plan Assessment & Plan COVID-19 infection with acute cough and fever COVID-19 infection confirmed with symptoms starting three days ago, including severe cough, fever, and gastrointestinal upset. Cough is persistent and causing significant discomfort, with associated vomiting. Fever is present, but not consistently monitored. No significant wheezing or shortness of breath  reported, though some mild episodes noted. No sinus infection symptoms, but cough syrup and Tylenol provide some relief. Prednisone  and Augmentin  are considered to help with respiratory symptoms and potential bacterial superinfection. Mucinex  DM is avoided due to potential blood pressure elevation and current medication interactions. - Prescribe prednisone  to help with breathing and reduce inflammation. - Prescribe Augmentin . - Instruct to stay home and avoid contact with others until 24 hours fever-free without the use of Tylenol. - Advise to wear a mask  for five days after being fever-free for 24 hours. - Instruct to seek medical attention if experiencing increased shortness of breath or wheezing. - Send prescriptions to the pharmacy.  Recording duration: 7 minutes      Return for f/u PCP if no improvement in symptoms.     Ginger Patrick, MSN, APRN, FNP-C [facility]

## 2024-02-20 NOTE — Telephone Encounter (Unsigned)
 Copied from CRM #[id number]. Topic: Clinical - Medication Refill >> Feb 20, 2024 11:20 AM Anairis L wrote: Medication: predniSONE  (STERAPRED UNI-PAK 21 TAB) 10 MG (21) TBPK tablet  Has the patient contacted their pharmacy? Yes (Agent: If no, request that the patient contact the pharmacy for the refill. If patient does not wish to contact the pharmacy document the reason why and proceed with request.) (Agent: If yes, when and what did the pharmacy advise?)  This is the patient's preferred pharmacy:   [facility] [phone] [facility] [place] - [address] [place] [address] Phone: [phone] Fax: [phone]    Is this the correct pharmacy for this prescription? Yes If no, delete pharmacy and type the correct one.   Has the prescription been filled recently? No  Is the patient out of the medication? Yes  Has the patient been seen for an appointment in the last year OR does the patient have an upcoming appointment? Yes  Can we respond through MyChart? Yes  Agent: Please be advised that Rx refills may take up to 3 business days. We ask that you follow-up with your pharmacy.

## 2024-04-10 ENCOUNTER — Encounter: Payer: Self-pay | Admitting: Dermatology

## 2024-04-10 ENCOUNTER — Ambulatory Visit: Admitting: Dermatology

## 2024-04-10 DIAGNOSIS — L570 Actinic keratosis: Secondary | ICD-10-CM | POA: Diagnosis not present

## 2024-04-10 DIAGNOSIS — D229 Melanocytic nevi, unspecified: Secondary | ICD-10-CM | POA: Diagnosis not present

## 2024-04-10 DIAGNOSIS — L821 Other seborrheic keratosis: Secondary | ICD-10-CM | POA: Diagnosis not present

## 2024-04-10 DIAGNOSIS — L578 Other skin changes due to chronic exposure to nonionizing radiation: Secondary | ICD-10-CM | POA: Diagnosis not present

## 2024-04-10 DIAGNOSIS — L814 Other melanin hyperpigmentation: Secondary | ICD-10-CM

## 2024-04-10 DIAGNOSIS — L738 Other specified follicular disorders: Secondary | ICD-10-CM

## 2024-04-10 DIAGNOSIS — D1801 Hemangioma of skin and subcutaneous tissue: Secondary | ICD-10-CM

## 2024-04-10 NOTE — Progress Notes (Signed)
 Follow-Up Visit   Subjective  Kevin Charles is a 63 y.o. male who presents for the following: Skin Cancer Screening and Full Body Skin Exam hx of BCCs, Aks, check spot L ear, pt thinks has been txted in past with LN2, Blackhead glabella, whiteheads R cheek  The patient presents for Total-Body Skin Exam (TBSE) for skin cancer screening and mole check. The patient has spots, moles and lesions to be evaluated, some may be new or changing and the patient may have concern these could be cancer.    The following portions of the chart were reviewed this encounter and updated as appropriate: medications, allergies, medical history  Review of Systems:  No other skin or systemic complaints except as noted in HPI or Assessment and Plan.  Objective  Well appearing patient in no apparent distress; mood and affect are within normal limits.  A full examination was performed including scalp, head, eyes, ears, nose, lips, neck, chest, axillae, abdomen, back, buttocks, bilateral upper extremities, bilateral lower extremities, hands, feet, fingers, toes, fingernails, and toenails. All findings within normal limits unless otherwise noted below.   Relevant physical exam findings are noted in the Assessment and Plan.  R preauricular x 1, R helix 2, post parietal scalp x 2, central frontal scalp x 1, L temple x 1, L helix x 2 (9) Pink scaly macules  Assessment & Plan   SKIN CANCER SCREENING PERFORMED TODAY.  ACTINIC DAMAGE - Chronic condition, secondary to cumulative UV/sun exposure - diffuse scaly erythematous macules with underlying dyspigmentation - Recommend daily broad spectrum sunscreen SPF 30+ to sun-exposed areas, reapply every 2 hours as needed.  - Staying in the shade or wearing long sleeves, sun glasses (UVA+UVB protection) and wide brim hats (4-inch brim around the entire circumference of the hat) are also recommended for sun protection.  - Call for new or changing  lesions.  LENTIGINES, SEBORRHEIC KERATOSES, HEMANGIOMAS - Benign normal skin lesions - Benign-appearing - Call for any changes  MELANOCYTIC NEVI - Tan-brown and/or pink-flesh-colored symmetric macules and papules - Benign appearing on exam today - Observation - Call clinic for new or changing moles - Recommend daily use of broad spectrum spf 30+ sunscreen to sun-exposed areas.   HISTORY OF BASAL CELL CARCINOMA OF THE SKIN - No evidence of recurrence today - Recommend regular full body skin exams - Recommend daily broad spectrum sunscreen SPF 30+ to sun-exposed areas, reapply every 2 hours as needed.  - Call if any new or changing lesions are noted between office visits  - R ant shoulder, L post neck  Sebaceous Hyperplasia face - Small yellow papules with a central dell - Benign-appearing - Observe. Call for changes.  AK (ACTINIC KERATOSIS) (9) R preauricular x 1, R helix 2, post parietal scalp x 2, central frontal scalp x 1, L temple x 1, L helix x 2 (9) Actinic keratoses are precancerous spots that appear secondary to cumulative UV radiation exposure/sun exposure over time. They are chronic with expected duration over 1 year. A portion of actinic keratoses will progress to squamous cell carcinoma of the skin. It is not possible to reliably predict which spots will progress to skin cancer and so treatment is recommended to prevent development of skin cancer.  Recommend daily broad spectrum sunscreen SPF 30+ to sun-exposed areas, reapply every 2 hours as needed.  Recommend staying in the shade or wearing long sleeves, sun glasses (UVA+UVB protection) and wide brim hats (4-inch brim around the entire circumference of the hat).  Call for new or changing lesions. Destruction of lesion - R preauricular x 1, R helix 2, post parietal scalp x 2, central frontal scalp x 1, L temple x 1, L helix x 2 (9) Complexity: simple   Destruction method: cryotherapy   Informed consent: discussed and  consent obtained   Timeout:  patient name, date of birth, surgical site, and procedure verified Lesion destroyed using liquid nitrogen: Yes   Region frozen until ice ball extended beyond lesion: Yes   Cryo cycles: 1 or 2. Outcome: patient tolerated procedure well with no complications   Post-procedure details: wound care instructions given    MULTIPLE BENIGN NEVI   LENTIGINES   SEBORRHEIC KERATOSES   ACTINIC ELASTOSIS   CHERRY ANGIOMA   SEBACEOUS HYPERPLASIA   Return in about 6 months (around 10/09/2024) for TBSE, Hx of BCC, Hx of AKs.  I, Grayce Saunas, RMA, am acting as scribe for Boneta Sharps, MD .   Documentation: I have reviewed the above documentation for accuracy and completeness, and I agree with the above.  Boneta Sharps, MD

## 2024-04-10 NOTE — Patient Instructions (Addendum)

## 2024-05-08 ENCOUNTER — Ambulatory Visit

## 2024-05-08 ENCOUNTER — Ambulatory Visit (INDEPENDENT_AMBULATORY_CARE_PROVIDER_SITE_OTHER)
Admission: RE | Admit: 2024-05-08 | Discharge: 2024-05-08 | Disposition: A | Discharge status: Home / Self Care | Source: Ambulatory Visit

## 2024-05-08 ENCOUNTER — Telehealth: Payer: Self-pay

## 2024-05-08 VITALS — BP 126/82 | HR 75 | Temp 98.2°F | Ht 75.0 in | Wt 232.0 lb

## 2024-05-08 DIAGNOSIS — R051 Acute cough: Secondary | ICD-10-CM | POA: Diagnosis not present

## 2024-05-08 DIAGNOSIS — J189 Pneumonia, unspecified organism: Secondary | ICD-10-CM

## 2024-05-08 LAB — POC COVID19 BINAXNOW: SARS Coronavirus 2 Ag: NEGATIVE

## 2024-05-08 LAB — POCT RAPID STREP A (OFFICE): Rapid Strep A Screen: NEGATIVE

## 2024-05-08 MED ORDER — DEXTROMETHORPHAN POLISTIREX ER 30 MG/5ML PO SUER: 30.0000 mg | Freq: Two times a day (BID) | ORAL | 0 refills | Status: AC

## 2024-05-08 MED ORDER — AMOXICILLIN-POT CLAVULANATE 875-125 MG PO TABS: 1.0000 | ORAL_TABLET | Freq: Two times a day (BID) | ORAL | 0 refills | Status: AC

## 2024-05-08 MED ORDER — DOXYCYCLINE HYCLATE 100 MG PO TABS: 100.0000 mg | ORAL_TABLET | Freq: Two times a day (BID) | ORAL | 0 refills | Status: AC

## 2024-05-08 NOTE — Patient Instructions (Signed)
 Thank you for visiting [facility] today! Here's what we talked about: - START Doxycline and augmentin , and cough syrup - Go to the ED if high fevers greater than 100.6F not responsive to Tylenol, shortness of breath, or chest pain

## 2024-05-08 NOTE — Addendum Note (Signed)
 Addended by: KALLIE CLOTILDA SQUIBB on: 05/08/2024 10:45 AM   Modules accepted: Orders

## 2024-05-08 NOTE — Telephone Encounter (Signed)
 Spoke to pt.

## 2024-05-08 NOTE — Progress Notes (Signed)
 Subjective:   This visit was conducted in person. The patient gave informed consent to the use of Abridge AI technology to record the contents of the encounter as documented below.   Patient ID: EDGER HUSAIN, male    DOB: 1960/08/19, 63 y.o.   MRN: [id number]   Discussed the use of AI scribe software for clinical note transcription with the patient, who gave verbal consent to proceed.  History of Present Illness ARLAN BIRKS is a 63 year old male who presents with a worsening cough and fever following yard work.  Six days ago, he performed yard work while wearing a mask due to dry and dusty conditions. The following day, he developed a sore throat and a worsening cough. He has a chronic throat tickle since a previous COVID infection, which has not completely resolved. The current cough is more severe than usual, especially when drinking cold beverages, causing a tickle in his throat and a persistent need to clear it.  He experiences fevers, with the highest recorded at 100F, and chills. No chest pain or difficulty breathing. He has been around others at church who may have been ill, but he is unsure of any direct exposure to illness.  The cough is productive, with sputum that is light yellow in color. It sometimes feels as though it extends deep into his chest. He has been using Tylenol, Alka-Seltzer, and a prescription cough syrup containing guaifenesin , with limited relief. He has about two doses left of the cough syrup.  He experienced a headache on Sunday, which he attributes to high blood pressure, recorded at 199/97, after consuming salty foods. He had another headache yesterday.  He reports sinus issues and clear nasal discharge. He also mentions a small wound under his upper lip, likely from biting, which is not currently painful.  He quit smoking in 1995 and was never a daily smoker. He is frequently exposed to dust at church and during yard work.   Review of Systems   All other systems reviewed and are negative.       Allergies  Allergen Reactions   Flonase [Fluticasone]     nosebleeds   Metoprolol      bradycardia   Pravastatin  Sodium     Aches with pravastatin  but able to tolerate lipitor    Current Outpatient Medications on File Prior to Visit  Medication Sig Dispense Refill   allopurinol  (ZYLOPRIM ) 100 MG tablet Take 2 tablets (200 mg total) by mouth daily. 180 tablet 3   aspirin 81 MG tablet Take 81 mg by mouth daily.     Benzoyl Peroxide 10 % CREA Apply to face at night.     chlorthalidone  (HYGROTON ) 25 MG tablet Take 1 tablet (25 mg total) by mouth every other day. 45 tablet 3   Cyanocobalamin  (B-12) 2000 MCG TABS Take 1 tablet by mouth daily.     divalproex  (DEPAKOTE  ER) 500 MG 24 hr tablet TAKE 2 TABLETS BY MOUTH TWICE  DAILY EXCEPT SUNDAY AND  WEDNESDAY ONLY TAKE 1 TABLET IN  THE MORNING AND 2 TABLETS IN THE EVENING 370 tablet 2   fexofenadine (ALLEGRA) 60 MG tablet Take 60 mg by mouth daily.     indomethacin  (INDOCIN ) 50 MG capsule Take 1 capsule (50 mg total) by mouth 3 (three) times daily with meals. As needed for gout pain 90 capsule 1   levothyroxine  (SYNTHROID ) 150 MCG tablet TAKE 1 TABLET BY MOUTH ONCE DAILY-except take half tablet on Sundays.  6.5 tablets/week in  total 100 tablet 3   losartan  (COZAAR ) 25 MG tablet Take 1 tablet (25 mg total) by mouth daily. 100 tablet 3   pantoprazole  (PROTONIX ) 40 MG tablet Taking 1 pill 4 times per week.     rizatriptan  (MAXALT -MLT) 5 MG disintegrating tablet TAKE ONE (1) TABLET BY MOUTH AS NEEDED FOR MIGRAINE; MAY REPEAT IN 2 HOURS IF NEEDED 4 tablet 12   rosuvastatin  (CRESTOR ) 20 MG tablet Take 20 mg by mouth. 1 or 2 per week     SAW PALMETTO, SERENOA REPENS, PO Take 1 or 2 tablets by mouth daily.     No current facility-administered medications on file prior to visit.    BP 126/82 (BP Location: Left Arm, Patient Position: Sitting, Cuff Size: Large)   Pulse 75   Temp 98.2 F (36.8 C)  (Oral)   Ht 6' 3 (1.905 m)   Wt 232 lb (105.2 kg)   SpO2 98%   BMI 29.00 kg/m   Objective:      Physical Exam GENERAL: Alert, cooperative, well developed, no acute distress. HEAD: Normocephalic atraumatic. EYES: Extraocular movements intact bilaterally, pupils round, equal and reactive to light bilaterally, conjunctivae normal bilaterally. THROAT: Bilateral maxillary sinus tenderness. Mild posterior oropharyngeal erythema. CARDIOVASCULAR: Normal heart rate and rhythm, S1 and S2 normal without murmurs. CHEST: Coarse breath sounds in the lower lobes bilaterally, no wheezes, rhonchi, or crackles.  No acute respiratory distress EXTREMITIES: No cyanosis or edema. NEUROLOGICAL: Oriented to person, place and time, no gait abnormalities, moves all extremities without gross motor or sensory deficit.         Assessment & Plan:   Assessment & Plan Community-acquired pneumonia Acute cough with fever, chills, COVID and strep are negative. Coarse breath sounds noted.  Will order CXR and treat empirically.  - Ordered chest x-ray to evaluate for pneumonia. - Prescribed Augmentin  1 tablet twice a day for 7 days. - Prescribed doxycycline  1 tablet twice a day for 7 days. - Prescribed Robitussin (guaifenesin ) 5 mL twice a day for cough. - Advised Tylenol for fever management. - Instructed to go to the emergency department if experiencing difficulty breathing, severe chest pain, or worsening condition. - Advised to maintain hydration.    Return for worsening of symptoms or failure to improve.   Aleea Hendry K Leinani Lisbon, MD  05/08/24     Contains text generated by Abridge.

## 2024-05-08 NOTE — Telephone Encounter (Signed)
 Pls call pt, update that chest xray did not show anything worrisome, he should still complete his course of antibiotics

## 2024-05-09 ENCOUNTER — Ambulatory Visit: Payer: Self-pay

## 2024-05-15 ENCOUNTER — Other Ambulatory Visit: Payer: Self-pay | Admitting: Family Medicine

## 2024-05-15 DIAGNOSIS — M109 Gout, unspecified: Secondary | ICD-10-CM

## 2024-05-20 NOTE — Telephone Encounter (Signed)
 E-scribed refill.   Pls schedule annual exam and fasting labs, after 07/09/24, for additional refills.

## 2024-05-21 ENCOUNTER — Telehealth: Payer: Self-pay | Admitting: Family Medicine

## 2024-05-21 NOTE — Telephone Encounter (Signed)
 Copied from CRM [id number]. Topic: General - Call Back - No Documentation >> May 21, 2024  1:48 PM Eva FALCON wrote: Reason for CRM: Pt is wondering if he could get his labs done when he comes in for his annual wellness visit on 06/23/24. States he has labs on 06/27/24 with [facility], was wondering if those labs would be good for his physical for 1/29 with Dr. Cleatus, or if different labs need to be placed for that appointment. Pt is requesting call back rather than sending message on MyChart

## 2024-05-21 NOTE — Telephone Encounter (Signed)
 I called and left a voicemail for patient to be scheduled.

## 2024-06-09 ENCOUNTER — Encounter: Payer: Self-pay | Admitting: Nurse Practitioner

## 2024-06-09 ENCOUNTER — Ambulatory Visit (INDEPENDENT_AMBULATORY_CARE_PROVIDER_SITE_OTHER): Admitting: Nurse Practitioner

## 2024-06-09 VITALS — BP 138/82 | HR 70 | Temp 98.0°F | Resp 18 | Ht 75.0 in | Wt 230.0 lb

## 2024-06-09 DIAGNOSIS — R051 Acute cough: Secondary | ICD-10-CM

## 2024-06-09 DIAGNOSIS — R509 Fever, unspecified: Secondary | ICD-10-CM

## 2024-06-09 DIAGNOSIS — J101 Influenza due to other identified influenza virus with other respiratory manifestations: Secondary | ICD-10-CM | POA: Diagnosis not present

## 2024-06-09 LAB — POC COVID19/FLU A&B COMBO
Covid Antigen, POC: NEGATIVE
Influenza A Antigen, POC: POSITIVE — AB
Influenza B Antigen, POC: NEGATIVE

## 2024-06-09 MED ORDER — DEXTROMETHORPHAN POLISTIREX ER 30 MG/5ML PO SUER: 30.0000 mg | Freq: Two times a day (BID) | ORAL | 0 refills | Status: DC

## 2024-06-09 MED ORDER — BENZONATATE 100 MG PO CAPS: 200.0000 mg | ORAL_CAPSULE | Freq: Two times a day (BID) | ORAL | 0 refills | Status: DC | PRN

## 2024-06-09 MED ORDER — OSELTAMIVIR PHOSPHATE 75 MG PO CAPS: 75.0000 mg | ORAL_CAPSULE | Freq: Two times a day (BID) | ORAL | 0 refills | Status: DC

## 2024-06-09 NOTE — Progress Notes (Signed)
 "  BP 138/82   Pulse 70   Temp 98 F (36.7 C)   Resp 18   Ht 6' 3 (1.905 m)   Wt 230 lb (104.3 kg)   SpO2 97%   BMI 28.75 kg/m    Subjective:    Patient ID: Kevin Charles, male    DOB: January 27, 1961, 63 y.o.   MRN: [id number]  HPI: Kevin Charles is a 63 y.o. male  Chief Complaint  Patient presents with   URI    Onset 2 days, cough, sore throat and congestion   Discussed the use of AI scribe software for clinical note transcription with the patient, who gave verbal consent to proceed.  History of Present Illness Kevin Charles is a 63 year old male who presents with symptoms of an upper respiratory infection and a positive flu A test.  Upper respiratory symptoms - Onset Friday afternoon with sore throat, managed by gargling - Sore throat resolved by Sunday - Persistent cough and nasal congestion with mucus production - Alternates between resting in a chair and in bed at night due to symptoms - Uses store-brand Robitussin and guaifenesin  for cough with good effect  Systemic symptoms - Fever ranging from 99 to 99.9F, primarily in the evening - Generalized body aches - Chills, with teeth 'shivering together'  Recent infectious history - Recent recovery from pneumonia with prior lung involvement - Completed a course of antibiotics for eye issues related to chalazions and styes, attributed to previous COVID-19 infection  Immunization status and exposure risk - Typically receives annual influenza vaccination but has not received one this season due to insurance timing - Attended a crowded and cold social gathering last Friday  Medication and comorbidities - History of hypertension and seizure disorder - Takes antihypertensive and seizure medications - No prior use of oseltamivir  (Tamiflu ); concerned about potential drug interactions with current medications         12/04/2023   10:59 AM 11/13/2023    9:38 AM 07/10/2023    9:48 AM  Depression screen PHQ 2/9   Decreased Interest 0 0 0  Down, Depressed, Hopeless 0 0 0  PHQ - 2 Score 0 0 0  Altered sleeping 0  0  Tired, decreased energy 1  0  Change in appetite 2  0  Feeling bad or failure about yourself  0  0  Trouble concentrating 0  0  Moving slowly or fidgety/restless 0  0  Suicidal thoughts 0  0  PHQ-9 Score 3   0   Difficult doing work/chores Not difficult at all       Data saved with a previous flowsheet row definition    Relevant past medical, surgical, family and social history reviewed and updated as indicated. Interim medical history since our last visit reviewed. Allergies and medications reviewed and updated.  Review of Systems  Ten systems reviewed and is negative except as mentioned in HPI      Objective:      BP 138/82   Pulse 70   Temp 98 F (36.7 C)   Resp 18   Ht 6' 3 (1.905 m)   Wt 230 lb (104.3 kg)   SpO2 97%   BMI 28.75 kg/m    Wt Readings from Last 3 Encounters:  06/09/24 230 lb (104.3 kg)  05/08/24 232 lb (105.2 kg)  02/20/24 230 lb 6.4 oz (104.5 kg)    Physical Exam GENERAL: Alert, cooperative, well developed, no acute distress. HEENT: Normocephalic, normal oropharynx,  moist mucous membranes. Ears: TMs clear NECK: Cervical lymphadenopathy present. CHEST: Lungs with congestion, no wheeze, or rhales CARDIOVASCULAR: Normal heart rate and rhythm, S1 and S2 normal without murmurs. ABDOMEN: Soft, non-tender, non-distended, without organomegaly, normal bowel sounds. EXTREMITIES: No cyanosis or edema. NEUROLOGICAL: Cranial nerves grossly intact, moves all extremities without gross motor or sensory deficit.  Results for orders placed or performed in visit on 06/09/24  POC Covid19/Flu A&B Antigen   Collection Time: 06/09/24 10:27 AM  Result Value Ref Range   Influenza A Antigen, POC Positive (A) Negative   Influenza B Antigen, POC Negative Negative   Covid Antigen, POC Negative Negative          Assessment & Plan:   Problem List Items  Addressed This Visit   None Visit Diagnoses       Influenza A    -  Primary   Relevant Medications   oseltamivir  (TAMIFLU ) 75 MG capsule   benzonatate  (TESSALON ) 100 MG capsule   dextromethorphan  (DELSYM ) 30 MG/5ML liquid     Fever, unspecified fever cause       Relevant Medications   oseltamivir  (TAMIFLU ) 75 MG capsule   benzonatate  (TESSALON ) 100 MG capsule   dextromethorphan  (DELSYM ) 30 MG/5ML liquid   Other Relevant Orders   POC Covid19/Flu A&B Antigen (Completed)     Acute cough       Relevant Medications   oseltamivir  (TAMIFLU ) 75 MG capsule   benzonatate  (TESSALON ) 100 MG capsule   dextromethorphan  (DELSYM ) 30 MG/5ML liquid   Other Relevant Orders   POC Covid19/Flu A&B Antigen (Completed)        Assessment and Plan Assessment & Plan Influenza A with respiratory manifestations Acute Influenza A infection confirmed by positive test. Symptoms include sore throat, cough, congestion, fever, and myalgia. Lymphadenopathy present. Lungs clear without wheezing. Recent pneumonia recovery. COVID-19 test negative. Within treatment window for antiviral therapy. - Prescribed Tamiflu  for influenza treatment, discussed potential side effects including gastrointestinal upset and diarrhea. - Prescribed cough medicine, specifically Robitussin, for symptomatic relief. - Advised increased fluid intake to prevent dehydration. - Recommended avoiding social gatherings until symptoms improve.  Recommend taking zyrtec, flonase, mucinex , vitamin d, vitamin c, and zinc. Push fluids and get rest.         Follow up plan: Return if symptoms worsen or fail to improve. "

## 2024-06-23 ENCOUNTER — Ambulatory Visit (INDEPENDENT_AMBULATORY_CARE_PROVIDER_SITE_OTHER)

## 2024-06-23 VITALS — BP 130/80 | HR 65 | Ht 71.0 in | Wt 217.0 lb

## 2024-06-23 DIAGNOSIS — Z0000 Encounter for general adult medical examination without abnormal findings: Secondary | ICD-10-CM
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
## 2024-06-23 NOTE — Patient Instructions (Signed)
 Kevin Charles,  Thank you for taking the time for your Medicare Wellness Visit. I appreciate your continued commitment to your health goals. Please review the care plan we discussed, and feel free to reach out if I can assist you further.  Please note that Annual Wellness Visits do not include a physical exam. Some assessments may be limited, especially if the visit was conducted virtually. If needed, we may recommend an in-person follow-up with your provider.  Ongoing Care Seeing your primary care provider every 3 to 6 months helps us  monitor your health and provide consistent, personalized care.   Referrals If a referral was made during today's visit and you haven't received any updates within two weeks, please contact the referred provider directly to check on the status.  Recommended Screenings:  Health Maintenance  Topic Date Due   Pneumococcal Vaccine for age over 61 (1 of 2 - PCV) Never done   Medicare Annual Wellness Visit  06/25/2024   Flu Shot  09/16/2024*   COVID-19 Vaccine (4 - 2025-26 season) 03/14/2028*   Colon Cancer Screening  09/10/2026   DTaP/Tdap/Td vaccine (4 - Td or Tdap) 07/19/2032   Hepatitis C Screening  Completed   HIV Screening  Completed   Zoster (Shingles) Vaccine  Completed   Hepatitis B Vaccine  Aged Out   HPV Vaccine  Aged Out   Meningitis B Vaccine  Aged Out  *Topic was postponed. The date shown is not the original due date.       06/23/2024    9:07 AM  Advanced Directives  Does Patient Have a Medical Advance Directive? Yes  Type of Estate Agent of [place];Living will  Does patient want to make changes to medical advance directive? No - Patient declined  Copy of Healthcare Power of Attorney in Chart? No - copy requested    Vision: Annual vision screenings are recommended for early detection of glaucoma, cataracts, and diabetic retinopathy. These exams can also reveal signs of chronic conditions such as diabetes and high blood  pressure.  Dental: Annual dental screenings help detect early signs of oral cancer, gum disease, and other conditions linked to overall health, including heart disease and diabetes.  Please see the attached documents for additional preventive care recommendations.

## 2024-06-26 ENCOUNTER — Ambulatory Visit: Payer: Medicare Other

## 2024-06-26 ENCOUNTER — Other Ambulatory Visit: Payer: Self-pay

## 2024-06-26 DIAGNOSIS — Z125 Encounter for screening for malignant neoplasm of prostate: Secondary | ICD-10-CM

## 2024-06-26 DIAGNOSIS — R972 Elevated prostate specific antigen [PSA]: Secondary | ICD-10-CM

## 2024-06-27 ENCOUNTER — Other Ambulatory Visit: Payer: Self-pay

## 2024-06-27 DIAGNOSIS — R972 Elevated prostate specific antigen [PSA]: Secondary | ICD-10-CM

## 2024-06-27 DIAGNOSIS — Z125 Encounter for screening for malignant neoplasm of prostate: Secondary | ICD-10-CM

## 2024-06-28 LAB — PSA: Prostate Specific Ag, Serum: 3.5 ng/mL (ref 0.0–4.0)

## 2024-07-04 ENCOUNTER — Ambulatory Visit: Payer: Self-pay | Admitting: Physician Assistant

## 2024-07-04 VITALS — BP 133/80 | HR 65 | Ht 71.0 in | Wt 217.0 lb

## 2024-07-04 DIAGNOSIS — N3943 Post-void dribbling: Secondary | ICD-10-CM | POA: Diagnosis not present

## 2024-07-04 DIAGNOSIS — Z125 Encounter for screening for malignant neoplasm of prostate: Secondary | ICD-10-CM

## 2024-07-04 LAB — BLADDER SCAN AMB NON-IMAGING: Scan Result: 2
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
## 2024-07-17 ENCOUNTER — Ambulatory Visit: Admitting: Family Medicine

## 2024-07-17 ENCOUNTER — Encounter: Payer: Self-pay | Admitting: Family Medicine

## 2024-07-17 VITALS — BP 130/80 | HR 61 | Temp 97.9°F | Ht 70.5 in | Wt 226.0 lb

## 2024-07-17 DIAGNOSIS — Z23 Encounter for immunization: Secondary | ICD-10-CM | POA: Diagnosis not present

## 2024-07-17 DIAGNOSIS — Z0000 Encounter for general adult medical examination without abnormal findings: Secondary | ICD-10-CM

## 2024-07-17 DIAGNOSIS — G43909 Migraine, unspecified, not intractable, without status migrainosus: Secondary | ICD-10-CM

## 2024-07-17 DIAGNOSIS — E039 Hypothyroidism, unspecified: Secondary | ICD-10-CM

## 2024-07-17 DIAGNOSIS — G40909 Epilepsy, unspecified, not intractable, without status epilepticus: Secondary | ICD-10-CM | POA: Diagnosis not present

## 2024-07-17 DIAGNOSIS — Z7189 Other specified counseling: Secondary | ICD-10-CM

## 2024-07-17 DIAGNOSIS — E785 Hyperlipidemia, unspecified: Secondary | ICD-10-CM

## 2024-07-17 DIAGNOSIS — M109 Gout, unspecified: Secondary | ICD-10-CM

## 2024-07-17 DIAGNOSIS — K219 Gastro-esophageal reflux disease without esophagitis: Secondary | ICD-10-CM

## 2024-07-17 DIAGNOSIS — I10 Essential (primary) hypertension: Secondary | ICD-10-CM | POA: Diagnosis not present

## 2024-07-17 LAB — LIPID PANEL
Cholesterol: 186 mg/dL (ref 28–200)
HDL: 33.1 mg/dL — ABNORMAL LOW
LDL Cholesterol: 92 mg/dL (ref 10–99)
NonHDL: 152.98
Total CHOL/HDL Ratio: 6
Triglycerides: 307 mg/dL — ABNORMAL HIGH (ref 10.0–149.0)
VLDL: 61.4 mg/dL — ABNORMAL HIGH (ref 0.0–40.0)

## 2024-07-17 LAB — CBC WITH DIFFERENTIAL/PLATELET
Basophils Absolute: 0.1 10*3/uL (ref 0.0–0.1)
Basophils Relative: 0.9 % (ref 0.0–3.0)
Eosinophils Absolute: 0.1 10*3/uL (ref 0.0–0.7)
Eosinophils Relative: 2.1 % (ref 0.0–5.0)
HCT: 50.2 % (ref 39.0–52.0)
Hemoglobin: 17.1 g/dL — ABNORMAL HIGH (ref 13.0–17.0)
Lymphocytes Relative: 39.1 % (ref 12.0–46.0)
Lymphs Abs: 2.2 10*3/uL (ref 0.7–4.0)
MCHC: 34.2 g/dL (ref 30.0–36.0)
MCV: 96.1 fl (ref 78.0–100.0)
Monocytes Absolute: 0.7 10*3/uL (ref 0.1–1.0)
Monocytes Relative: 12.5 % — ABNORMAL HIGH (ref 3.0–12.0)
Neutro Abs: 2.6 10*3/uL (ref 1.4–7.7)
Neutrophils Relative %: 45.4 % (ref 43.0–77.0)
Platelets: 140 10*3/uL — ABNORMAL LOW (ref 150.0–400.0)
RBC: 5.22 Mil/uL (ref 4.22–5.81)
RDW: 13.3 % (ref 11.5–15.5)
WBC: 5.8 10*3/uL (ref 4.0–10.5)

## 2024-07-17 LAB — TSH: TSH: 2.12 u[IU]/mL (ref 0.35–5.50)

## 2024-07-17 LAB — COMPREHENSIVE METABOLIC PANEL WITH GFR
ALT: 34 U/L (ref 3–53)
AST: 30 U/L (ref 5–37)
Albumin: 4.4 g/dL (ref 3.5–5.2)
Alkaline Phosphatase: 72 U/L (ref 39–117)
BUN: 21 mg/dL (ref 6–23)
CO2: 34 meq/L — ABNORMAL HIGH (ref 19–32)
Calcium: 10 mg/dL (ref 8.4–10.5)
Chloride: 102 meq/L (ref 96–112)
Creatinine, Ser: 0.73 mg/dL (ref 0.40–1.50)
GFR: 96.44 mL/min
Glucose, Bld: 77 mg/dL (ref 70–99)
Potassium: 4.5 meq/L (ref 3.5–5.1)
Sodium: 140 meq/L (ref 135–145)
Total Bilirubin: 0.9 mg/dL (ref 0.2–1.2)
Total Protein: 6.9 g/dL (ref 6.0–8.3)

## 2024-07-17 LAB — URIC ACID: Uric Acid, Serum: 6.2 mg/dL (ref 4.0–7.8)

## 2024-07-17 MED ORDER — LOSARTAN POTASSIUM 25 MG PO TABS: 25.0000 mg | ORAL_TABLET | Freq: Every day | ORAL | 3 refills | Status: AC

## 2024-07-17 MED ORDER — ALLOPURINOL 100 MG PO TABS: 200.0000 mg | ORAL_TABLET | Freq: Every day | ORAL | 3 refills | Status: AC

## 2024-07-17 MED ORDER — CHLORTHALIDONE 25 MG PO TABS: 25.0000 mg | ORAL_TABLET | ORAL | 3 refills | Status: AC

## 2024-07-17 MED ORDER — ROSUVASTATIN CALCIUM 20 MG PO TABS: 20.0000 mg | ORAL_TABLET | ORAL | 3 refills | Status: AC

## 2024-07-17 MED ORDER — LEVOTHYROXINE SODIUM 150 MCG PO TABS: ORAL_TABLET | ORAL | 3 refills | Status: AC

## 2024-07-17 MED ORDER — RIZATRIPTAN BENZOATE 5 MG PO TBDP: ORAL_TABLET | ORAL | 12 refills | Status: AC

## 2024-07-17 MED ORDER — INDOMETHACIN 50 MG PO CAPS: 50.0000 mg | ORAL_CAPSULE | Freq: Three times a day (TID) | ORAL | Status: AC | PRN

## 2024-07-17 MED ORDER — DIVALPROEX SODIUM ER 500 MG PO TB24: ORAL_TABLET | ORAL | 3 refills | Status: AC

## 2024-07-17 MED ORDER — PANTOPRAZOLE SODIUM 40 MG PO TBEC: DELAYED_RELEASE_TABLET | ORAL | 3 refills | Status: AC

## 2024-07-17 NOTE — Patient Instructions (Signed)
 Go to the lab on the way out.   If you have mychart we'll likely use that to update you.    Take care.  Glad to see you. If diet changes don't help with heartburn, then let me know.

## 2024-07-17 NOTE — Progress Notes (Signed)
 Advance directive- Kevin Charles designated if patient were incapacitated.  Colonoscopy 2025.   PSA 2026 tdap 2024 Flu shot today.   Shingles 2021 PNA d/w pt.   covid vaccine prev done.     Elevated Cholesterol: Using medications without problems: yes Muscle aches: no Diet compliance: yes Exercise: yes He is taking crestor  twice per week- tolerated.  No ADE on med.     Hypothyroidism.  Compliant.  No ADE on med.  No neck mass.  No dysphagia.     Hypertension:               Using medication without problems or lightheadedness: yes Chest pain with exertion:no exertional sx Edema: occ, not consistently.   Short of breath:no   SZ hx noted. Compliant. No events.  He has continued for years w/o events.  FH noted, mother had SZ.    He has still has some episodic migraines, more in summertime. No ADE on med.  With relief with maxalt .  Still used episodically with relief.   Gout hx d/w pt.  No recent flares.  Labs pending.  Compliant with allopurinol .  Not needing indomethacin  recently.    GERD sx.  Occ brief GERD sx at rest, not with exertion.  Taking protonix  ~4 times per week.  D/w pt about taking PPI more often if needed vs diet modification at night.    Mild hand tremor, similar to prev.  Longstanding.  I asked him to update me if he noted any changes.    PMH and SH reviewed   Meds, vitals, and allergies reviewed.    ROS: Per HPI.  Unless specifically indicated otherwise in HPI, the patient denies:   General: fever. Eyes: acute vision changes ENT: sore throat Cardiovascular: chest pain Respiratory: SOB GI: vomiting GU: dysuria Musculoskeletal: acute back pain Derm: acute rash Neuro: acute motor dysfunction Psych: worsening mood Endocrine: polydipsia Heme: bleeding Allergy: hayfever   GEN: nad, alert and oriented HEENT: ncat NECK: supple w/o LA, no carotid bruit B.  CV: rrr. PULM: ctab, no inc wob ABD: soft, +bs EXT: no edema SKIN: well perfused.

## 2024-07-18 LAB — VALPROIC ACID LEVEL: Valproic Acid Lvl: 78.4 mg/L (ref 50.0–100.0)

## 2024-07-20 ENCOUNTER — Ambulatory Visit: Payer: Self-pay | Admitting: Family Medicine

## 2024-07-20 NOTE — Assessment & Plan Note (Signed)
 Continue chlorthalidone  losartan 

## 2024-07-20 NOTE — Assessment & Plan Note (Signed)
 Compliant. No events.  He has continued for years w/o events.  FH noted, mother had SZ.  Continue as is with depakote .

## 2024-07-20 NOTE — Assessment & Plan Note (Signed)
 He is taking crestor  twice per week- tolerated.  No ADE on med.   Would continue as is.  Labs pending.

## 2024-07-20 NOTE — Assessment & Plan Note (Signed)
 No recent flares.  Labs pending.  Compliant with allopurinol .  Not needing indomethacin  recently.   Continue as is.

## 2024-07-20 NOTE — Assessment & Plan Note (Signed)
 Occ brief GERD sx at rest, not with exertion.  Taking protonix  ~4 times per week.  D/w pt about taking PPI more often if needed vs diet modification at night.

## 2024-07-20 NOTE — Assessment & Plan Note (Signed)
 Compliant.  No ADE on med.  No neck mass.  No dysphagia.  Continue levothyroxine  as is.

## 2024-07-20 NOTE — Assessment & Plan Note (Signed)
 Continue prn maxalt .  No ADE on med.  With relief with maxalt .  Still used episodically with relief.

## 2024-07-20 NOTE — Assessment & Plan Note (Signed)
Advance directive- Kevin Charles designated if patient were incapacitated.  

## 2024-07-20 NOTE — Assessment & Plan Note (Signed)
 Advance directive- Todd Irving designated if patient were incapacitated.  Colonoscopy 2025.   PSA 2026 tdap 2024 Flu shot today.   Shingles 2021 PNA d/w pt.   covid vaccine prev done.

## 2024-08-28 ENCOUNTER — Ambulatory Visit: Admitting: Cardiovascular Disease

## 2024-10-09 ENCOUNTER — Ambulatory Visit: Admitting: Dermatology

## 2025-06-30 ENCOUNTER — Other Ambulatory Visit

## 2025-07-07 ENCOUNTER — Ambulatory Visit: Admitting: Physician Assistant
